# Patient Record
Sex: Female | Born: 1970 | Race: White | Hispanic: No | Marital: Single
Health system: Midwestern US, Community
[De-identification: ages and names within clinical notes are randomized; demographics above are authoritative.]

## PROBLEM LIST (undated history)

## (undated) DIAGNOSIS — N736 Female pelvic peritoneal adhesions (postinfective): Secondary | ICD-10-CM

## (undated) DIAGNOSIS — R1031 Right lower quadrant pain: Secondary | ICD-10-CM

## (undated) DIAGNOSIS — F339 Major depressive disorder, recurrent, unspecified: Secondary | ICD-10-CM

## (undated) DIAGNOSIS — M25511 Pain in right shoulder: Secondary | ICD-10-CM

## (undated) DIAGNOSIS — I10 Essential (primary) hypertension: Secondary | ICD-10-CM

## (undated) HISTORY — PX: ABDOMINAL HYSTERECTOMY: SHX81

## (undated) MED ORDER — CLINDAMYCIN 300 MG CAP
300 mg | ORAL_CAPSULE | Freq: Four times a day (QID) | ORAL | Status: AC
Start: ? — End: 2012-05-16

---

## 2001-07-09 NOTE — ED Provider Notes (Signed)
[facility]                      EMERGENCY DEPARTMENT TREATMENT REPORT   NAME:  [name], [name]   MR #:         BILLING #: [id number]          DOS: 07/09/2001   TIME: 6:30 P   49-21-15   cc:   Primary Physician:   CHIEF COMPLAINT:  Wisdom tooth pain.   HISTORY OF PRESENT ILLNESS:  [age]-year-old female with a complaint   of right lower wisdom tooth pain for quite sometime, worse of the past 2   months.  She is scheduled to see her dentist in [place] on Thursday to have   this removed.  She was given a prescription for Vicodin ES and Trimox   essentially when she saw the dentist.  She states she had been out of   Vicodin ES for a week.  She has about 3 pills of the Trimox left. No fever,   no chills, no other complaints.   REVIEW OF SYSTEMS:  See HPI.   PAST MEDICAL HISTORY:  None.   MEDICATIONS:  Trimox.   ALLERGIES:  None.   PHYSICAL EXAMINATION:   GENERAL:  Well-developed, well-nourished female, awake and alert.   VITAL SIGNS:  Blood pressure 129/88, pulse 92, respirations 20, temperature   98.6.   HEENT:  Examination of her mouth does reveal a slightly impacted right   lower wisdom tooth.  The gingiva is tender.  The tooth is tender. I cannot   appreciate any abscess.  The remainder of the mouth is unremarkable.  Ears:   Clear bilaterally.   NECK:  Supple, symmetrical.  No cervical lymphadenopathy.   IMPRESSION AND MANAGEMENT PLAN:  [age]-year-old female who presents   for evaluation of dental pain.  At this time we will refill her Trimox.   She will be given a prescription for Vicodin #16 tablets. She will follow   up with her dentist this week for further evaluation and pain management.   Certainly return in the interim for worsening or new concerns.   FINAL DIAGNOSIS:  Acute, recurrent dental pain, right lower wisdom tooth.   DISPOSITION:  The patient was discharged home in stable condition, to    follow up as above. The patient was evaluated by myself and Dr. Vinnie Langton,   who agrees with the above assessment and plan.   Electronically Signed By:   Wetzel Bjornstad Arvella Merles, M.D. 07/11/2001 00:56   ____________________________   Wetzel Bjornstad. Arvella Merles, M.D.   cd  D:  07/09/2001  T:  07/10/2001  9:54 P   [id number]   Fara Chute, PA

## 2002-04-15 NOTE — ED Provider Notes (Signed)
[facility]                      EMERGENCY DEPARTMENT TREATMENT REPORT   NAME:  BETSIE, PECKMAN                      PT. LOCATION:     ER   MR #:         BILLING #: [id number]          DOS: 04/15/2002   TIME:11:22 A   49-21-15   cc:   Primary Physician:   CHIEF COMPLAINT:   Wisdom tooth pain.   HISTORY OF PRESENT ILLNESS:  The patient is a 32 year old female who has   been experiencing a painful right lower wisdom tooth.  She has seen her   dentist, is on Motrin, Penicillin, and Tylenol #3.  She is scheduled for   extraction on Friday.  Because of increasing pain unrelieved by Tylenol #3,   she presents for evaluation.   REVIEW OF SYSTEMS:   CONSTITUTIONAL:  No fever, chills, weight loss.   ENT:  Was last here for wisdom tooth discomfort back in April of 2003.   PAST MEDICAL HISTORY:  Unremarkable.   ALLERGIES:  None.   MEDICATIONS:  Motrin, Penicillin, Tylenol #3.   PHYSICAL EXAMINATION:   VITAL SIGNS:   Blood pressure 110/67, pulse 86, respirations 20,   temperature 97.7.  She rates her pain 6/10.   GENERAL:  The patient is a very pleasant female.   HEENT:   Oropharynx pink, membranes moist.  Tender right lower wisdom   tooth.  No adjacent gingival erythema, swelling, or area of fluctuance.   NECK:  Supple, nontender, symmetrical, no masses or JVD, trachea midline,   thyroid not enlarged, nodular, or tender.   LYMPHATICS:   No cervical or submandibular lymphadenopathy palpated.   IMPRESSION/MANAGEMENT PLAN:  The patient is a 32 year old female with   dental pain due to impacted wisdom tooth.  She is scheduled for extraction   and on antibiotics and Motrin.  Will give a prescription for Vicodin, #12.   FINAL DIAGNOSIS:  Acute dental pain.   DISPOSITION:  The patient is discharged home in stable condition, with   instructions to follow up with their regular doctor.  They are advised to   return immediately for any worsening or symptoms of concern.  Continue with   medications.    Electronically Signed By:   Shanna Cisco, M.D. 04/19/2002 23:17   ____________________________   Shanna Cisco, M.D.   jj  D:  04/15/2002  T:  04/15/2002  7:04 P   [id number]   Blanchard Mane, PA

## 2003-06-02 NOTE — ED Provider Notes (Signed)
[facility]                      EMERGENCY DEPARTMENT TREATMENT REPORT   NAME:  CHARLII, YOST                      PT. LOCATION:     ER  QC08   MR #:         BILLING #: [id number]          DOS: 06/02/2003   TIME: 1:16 P   31-21-15   cc:   Primary Physician:   Time of evaluation:  1305.   CHIEF COMPLAINT:  "I need a refill of my Adderall."   HISTORY OF PRESENT ILLNESS:  This is a 33 year old female who is requesting   a refill of her Adderall.  She states she is in the process of moving.  She   thought she had a refill left and does not.  She states she believes she   can get an appointment with her doctor on Wednesday or Thursday and is   asking to get enough until that time.  Denies any other complaints.   REVIEW OF SYSTEMS:   Denies any complaints at this time, requesting   medication refill.   PAST MEDICAL HISTORY:  ADD.   MEDICATIONS:  None.   ALLERGIES:  None.   SOCIAL HISTORY:  Noncontributory.   PHYSICAL EXAMINATION:   GENERAL:  Well-developed female.   VITAL SIGNS:  Blood pressure 146/90, pulse 129, respirations 20,   temperature 99.   HEENT:  Eyes:  Conjunctivae clear, lids normal.  Pupils equal, symmetrical,   and normally reactive.   Mouth/Throat:  Surfaces of the pharynx, palate,   and tongue are pink, moist, and without lesions.   NECK:  Supple, nontender, symmetrical, no masses or JVD, trachea midline,   thyroid not enlarged, nodular, or tender.   RESPIRATORY:  Clear and equal breath sounds.  No respiratory distress,   tachypnea, or accessory muscle use.   CARDIOVASCULAR:  Heart regular, without murmurs, gallops, rubs, or thrills.   PMI not displaced. Repeat pulse was 92.   IMPRESSION AND MANAGEMENT PLAN:  33-year-old female who presents for   medication refill of Adderall.  Discussed at this time that we do not   refill Adderall.  She need to see her doctor for that.   FINAL DIAGNOSIS:  Medication refill.    DISPOSITION:  The patient was discharged home in stable condition, to   follow up as above. The patient was evaluated by myself and Dr. Katharine Look,   who agrees with the above assessment and plan.   Electronically Signed By:   Docia Furl, M.D. 06/10/2003 16:43   ____________________________   Docia Furl, M.D.   cd  D:  06/02/2003  T:  06/03/2003  6:36 P   [id number]   Fara Chute, PA

## 2003-10-27 NOTE — ED Provider Notes (Signed)
[facility]                      EMERGENCY DEPARTMENT TREATMENT REPORT   NAME:  [name], [name]                      PT. LOCATION:     ER  QC01   MR #:         BILLING #: [id number]          DOS: 10/27/2003   TIME:12:51 P   49-21-15   cc:   Primary Physician:   Time seen:  1251 hours.   CHIEF COMPLAINT:  Dental pain.   HISTORY OF PRESENT ILLNESS:  This is a 33 year old female who presents with   a tooth and states that she is supposed to be having her right lower wisdom   tooth pulled in 4 days.  She say the dentist, Dr. Waverly Ferrari, in [facility]   last week and is on penicillin and Vicodin but that is not helping with the   pain.  She also took some Motrin.   REVIEW OF SYSTEMS:   CONSTITUTIONAL:  No fever.   ENT:  Positive dental pain, positive right ear pain.   PAST MEDICAL HISTORY:  Negative for any chronic illnesses.  States she is   scheduled to have all 4 wisdom teeth pulled on August 11th.   MEDICATIONS:  Penicillin, Vicodin, Motrin.   ALLERGIES: None.   SOCIAL HISTORY: Noncontributory.   PHYSICAL EXAMINATION:   VITAL SIGNS:  Blood pressure 116/63, pulse 65, respirations 16, temperature   97.8.   HEENT:  No facial edema or erythema noted.  Some edema noted of the gingiva   around right lower molar.  No abscess seen.   NECK:  Supple, no masses palpable.   PROCEDURE NOTE:  Dental block performed with 2% Xylocaine, 0.5% bupivacaine   with improvement in pain.   DIAGNOSIS: Acute Odontalgia   DISPOSITION:  The patient was advised to continue current medications and   call her dentist tomorrow.  The patient was evaluated by myself and Dr.   Jerolyn Center agrees with the above assessment and plan.   Electronically Signed By:   Stephan Minister, M.D. 10/31/2003 17:22   ____________________________   Stephan Minister, M.D.   sp  D:  10/27/2003  T:  10/28/2003 12:40 P   [id number]   DIANA A HOULE, PA-C

## 2004-10-31 NOTE — ED Provider Notes (Signed)
[facility]                      EMERGENCY DEPARTMENT TREATMENT REPORT   NAME:  PROSPERITY, DARROUGH                      PT. LOCATION:     ER  [id number]   MR #:         BILLING #: [id number]          DOS: 10/31/2004   TIME:10:34 A   49-21-15   cc:   Primary Physician:   CHIEF COMPLAINT:  Vision and pain.   HISTORY OF PRESENT ILLNESS:  This is a 34 year old female who woke up with   her eyes feeling irritated. She does wear soft contact lenses which she   slept in last night.  She took her lenses out because they were irritating   her and the pain has become progressively worse in both eyes.  Initially it   was worse in the left eye, but now both eyes hurt.  She is very sensitive   to light.  Her fianc  states she has been rubbing her eyes.  She has not   been getting more than about 4 hours of sleep at night due to just a very   busy schedule.  She has had no upper respiratory infection symptoms.  She   has not noticed any rash, no fever.   REVIEW OF SYSTEMS   CONSTITUTIONAL:  No fever, chills, weight loss.   EYES:  See HPI.   ENT: No sore throat, runny nose or other URI symptoms.   INTEGUMENTARY:  No rashes.   NEUROLOGICAL:  She states her head hurts because of her eyes.   Denies any other complaints.   PAST MEDICAL HISTORY:  None.   MEDICATIONS:  None.   ALLERGIES:  No known drug allergies.   SOCIAL HISTORY: Noncontributory.    Last menstrual period was 10-18-04.   PHYSICAL EXAMINATION:   GENERAL:   This is a well developed female.   VITAL SIGNS:  Blood pressure 117/72, pulse 73, respirations 20, temperature   96.9.   HEENT:   Eyes: The right eye is mildly injected.  Left eye is injected.   Pupils equal, round, reactive to light and accommodation.  Fluorescein   staining and Ophthaine were instilled.  Slit lamp examination with the   right eye reveals some stippled uptake along the edges of the cornea, some   faint more confluent uptake probably about 4 to 7 o'clock, no dendrites    seen.  The anterior chamber is clear.  No foreign body is seen.   Examination of the left eye also reveals some stippled uptake along the   edges of the cornea more pronounced in the lower portion with a superficial   abrasion seen.  No dendritic lesions seen.  The anterior chamber is clear.   There is no sign of alteration in either cornea, no foreign body.   Visual   acuity was attempted; however, the patient could not see due to the fact   that she did not have her contact lenses.  Ears:  Tympanic membranes are   clear.   INTEGUMENTARY:  There is no facial rash.   INITIAL ASSESSMENT AND MANAGEMENT PLAN:  This is a 34 year old female who   presents for evaluation of bilateral eye pain.  She has been wearing her   contact lenses, has slept  in them.  She does appear to have some keratitis   with some abrasion from her lenses.  At this time we will start her on some   Polytrim ophthalmic drops as well as prescription for Tylox #20.  We will   dilate her eyes with homatropine and she will return.  Dr. Cipriano Mile was in   to see and examine the patient as well.  Findings were reviewed.  She will   return tomorrow for recheck or sooner for worsening symptoms or concern.   She will also be given the name of Dr. Para March, on call for   ophthalmology, to follow up with on Monday.  We will recheck her tomorrow.   She was advised that she is not to wear her contact lenses until she is   cleared by the ophthalmologist.   FINAL DIAGNOSES:      1. Evaluation bilateral eye pain - keratitis and corneal abrasion.      2. Contact lenses.   DISPOSITION:  The patient was discharged to home in stable condition with   her family to follow up as above.  The patient was examined by myself and   Dr. Damien Fusi who agrees with the assessment and plan.   Electronically Signed By:   Damien Fusi, M.D. 11/01/2004 06:41   ____________________________   Damien Fusi, M.D.   rew  D:  10/31/2004  T:  10/31/2004  3:31 P   [id number]    Fara Chute, PA

## 2005-12-19 NOTE — ED Provider Notes (Signed)
[facility]                      EMERGENCY DEPARTMENT TREATMENT REPORT   NAME:  [name], [name]                      PT. LOCATION:     ER  [phone]   MR #:         BILLING #: [id number]          DOS: 12/19/2005   TIME:   49-21-15   cc:   Primary Physician:   TIME OF EVALUATION:  2239   CHIEF COMPLAINT:  Needs medication.   HISTORY OF PRESENT ILLNESS:  This is a 35 year old female who is stating   that her husband was suddenly called out of town and accidentally grabbed   her medications along with his when he left.  He is in [place] and has been   there for 3 days.  She states she has been fine over the weekend.  The   medication she is requesting is Adderall.  She states she cannot afford to   go to her primary care because it is an $85 visit just to get the   medication refilled.  She is denying any symptoms or any other complaints.   REVIEW OF SYSTEMS:  Essentially negative, and the patient is denying any   complaints.   PAST MEDICAL HISTORY:  Unremarkable.   SURGICAL HISTORY:  Tubal ligation.   PSYCHIATRIC HISTORY:  ADHD.   FAMILY HISTORY:  Noncontributory.   ALLERGIES:  The patient has no known medical allergies.   CURRENT MEDICATIONS:  Include Adderall 20 mg 2 times a day.   PHYSICAL EXAMINATION:   VITAL SIGNS:  Blood pressure is 113/71, pulse 105, respirations 20,   temperature 98, and O2 saturation 100% on room air.   GENERAL APPEARANCE:  Patient appears well developed and well nourished.   Appearance and behavior are age and situation appropriate.   RESPIRATORY:  Clear and equal breath sounds.  No respiratory distress,   tachypnea, or accessory muscle use.   CARDIOVASCULAR:  Heart regular, without murmurs, gallops, rubs, or thrills.   PMI not displaced.   MS:  Stance and gait appear normal.   SKIN:  Warm and dry without rashes.   NEUROLOGIC:  Cranial nerves, deep tendon reflexes, strength, and light   touch sensation are unremarkable.    PSYCHIATRIC:  Judgment appears appropriate.  Recent and remote memory   appears to be intact.  Oriented to time, place and person.  Mood and affect   appropriate.   INITIAL ASSESSMENT/MANAGEMENT PLAN:  I discussed this case with Dr. Luiz Iron who accessed her records on [facility] and also on [facility].  The   patient has multiple visits for complaints of pain and prescriptions for   Vicodin, however, does not have very many previous visits for prescriptions   for Adderall.  The plan is to write her a prescription for three 20-mg   tablets of Adderall and for her to follow up with her primary care   physician if she wishes to have any further prescriptions for Adderall.   There were no diagnostic studies performed.   DIAGNOSIS:  Medication refill.   DISPOSITION:  The patient is being discharged to home with a prescription   for 20-mg Adderall #3 tablets.  The patient is discharged with verbal and   written instructions and a referral  for ongoing care.  The patient is aware   that they may return at any time for new or worsening symptoms.   Electronically Signed By:   Luiz Iron, M.D. 12/26/2005 21:23   ____________________________   Luiz Iron, M.D.   My signature above authenticates this document and my orders, the final   diagnosis(es), discharge prescription(s) and instructions in the [facility] record.   st  D:  12/19/2005  T:  12/20/2005  1:15 P   [id number]   KARI TOWNS, PA-C

## 2007-06-20 NOTE — ED Provider Notes (Signed)
[facility]                      EMERGENCY DEPARTMENT TREATMENT REPORT   NAME:  JOVANKA, WESTGATE         PT. LOCATION:      [id number]          DOB:                                                                         AGE:   MR #:      BILLING #:           DOA:  06/20/2007   DOD:              SEX:  F   49-21-15   [id number]   cc:   Primary Physician:  Unknown   TIME SEEN: 1350.   CHIEF COMPLAINT: Sore throat.   HISTORY OF PRESENT ILLNESS: This is  a 37 year old female coming in with   above complaint.  The patient states this started yesterday as a little bit   of cough.  She said however, it started then to hit her throat and that is   now what is bothering her the most.  She says it is worse with swallowing.   She also is having pain in her right ear.  Has had some fevers at home   around 101.  Had taken some TheraFlu as needed and some over-the-counter   restricted cough medicine which apparently contains codeine.  Cough has had   a little bit of yellow/green phlegm.   REVIEW OF SYSTEMS:   CONSTITUTIONAL: Positive fever.   EYES: No visual symptoms.   ENT:  As described in HPI.   RESPIRATORY: Positive cough.  No wheezing.   CARDIOVASCULAR:  No chest pain, chest pressure, or palpitations.   INTEGUMENTARY:  No rashes.   PAST MEDICAL HISTORY: Anxiety.   SOCIAL HISTORY: Negative for tobacco.   CURRENT MEDICATIONS: Took an Ambien to try to sleep.   ALLERGIES:  No known drug allergies.   PHYSICAL EXAMINATION:   VITAL SIGNS:  Temperature 99.6, pulse 108, blood pressure 151/100,   respirations 20, oxygen 100% on room air.   GENERAL: Well-developed female.   HEENT: Head normocephalic.  Eyes nonicteric.  Conjunctivae clear. Ears:   She has notable fluid bilaterally.  No significant erythema or bulging of   the TMs.  Mouth/Throat:  Surfaces of the pharynx tonsils are swollen,   slightly red, no exudate.   NECK:  Supple. She does have some cervical lymphadenopathy noted.    CARDIOVASCULAR: Heart is borderline tachycardic. No murmur noted.   LUNGS: Pretty clear and equal.  Diminished expiratory sounds.   SKIN:  Warm and dry without rashes.   IMPRESSION/MANAGEMENT PLAN: This is a 37 year old female who is coming in   with fever and mostly sore throat.  I have gone ahead and swabbed her for   strep.   DIAGNOSTIC TESTING: Rapid strep test negative.   FINAL DIAGNOSES:   1. Viral pharyngitis.   2. Upper respiratory infection.   DISPOSITION: The patient discharged with a prescription for Tussionex.   Instructed to drink plenty of fluids, Motrin every 6 hours.  Return if   worsening, otherwise follow up with family doctor.   Electronically Signed By:   Haze Justin, M.D. 06/21/2007 15:18   ____________________________   Haze Justin, M.D.   My signature above authenticates this document and my orders, the final   diagnosis(es), discharge prescription(s) and instructions in the [facility] record.   DH  D:  06/20/2007  T:  06/20/2007  5:44 P   [id number]   BARBARA A GRUENHAGEN, PA-C

## 2007-12-16 NOTE — ED Provider Notes (Signed)
[facility]                      EMERGENCY DEPARTMENT TREATMENT REPORT   NAME:  [name], [name]          PT. LOCATION:    ER  ER41       DOB:  [birth year]                                                                     AGE:  37   MR #:       BILLING #:           DOS: 12/16/2007  TIME: 9:30 P   SEX:  F   49-21-15    [id number]   cc:   Primary Physician:   Primary Physician:   CHIEF COMPLAINT   Some tooth pain.   HISTORY OF PRESENT ILLNESS   This is a 37 year old female who presents to the emergency room with   complaint of dental pain.  The patient states that it has been bothering   her for the past year, and over the past couple months was just increasing.   She decided to get the tooth pulled, and she is being treated with   penicillin and Vicodin by her oral surgeon.  Unfortunately it ran out, and   a couple days later it started increasing in intensity.  She is scheduled   for a procedure next week.  The patient is coming in for concern in regards   to pain and possible infection for further evaluation and treatment.   REVIEW OF SYSTEMS   CONSTITUTIONAL:  No fever, chills, weight loss.   ENT:  Right lower jaw pain.   PAST MEDICAL HISTORY   Noncontributory.   SOCIAL HISTORY   Denies alcohol or tobacco use.   ALLERGIES   None.   PHYSICAL EXAMINATION   VITAL SIGNS:  Blood pressure 147/102, pulse 109, respirations 18,   temperature 98.2, oxygen saturations 100% on room air, pain 6/10.   GENERAL APPEARANCE:  The patient appears well developed and well nourished.   Appearance and behavior are age and situation appropriate.   HEENT:  ENT:  Right lower jaw, the patient's wisdom tooth on the right   lower aspect of the jaw is coming in angularly and pushing in to the   premolars.  Does show some inflammation.  Very tender to palpation.  Uvula   is central.  Tonsils are not enlarged.   INITIAL ASSESSMENT AND MANAGEMENT PLAN    This is a 37 year old female who is coming in with a wisdom tooth coming in   that is possibly impacted.  Will go ahead and continue to treat   symptomatically and have followup with her oral surgeon.   CLINICAL IMPRESSION/DIAGNOSIS   Jaw pain secondary to dental pain.   DISPOSITION/PLAN   The patient is discharged home in stable condition, with instructions to   follow up with their regular doctor.  They are advised to return   immediately for any worsening or symptoms of concern.  Discharged with a   prescription for penicillin and Vicodin.  The patient seen by Dr. Erlinda Hong.  Assessment and plan is agreed  upon.   Electronically Signed By:   Erlinda Hong, M.D. 01/06/2008 12:11   ____________________________   TODD Wilfrid Lund, M.D.   Dictated by:  Dorian Heckle, PA   My signature above authenticates this document and my orders, the final   diagnosis(es), discharge prescription(s) and instructions in the [facility] record.   SP1  D:  12/16/2007  T:  12/17/2007  9:08 A   [id number]/

## 2008-01-05 NOTE — ED Provider Notes (Signed)
[facility]                      EMERGENCY DEPARTMENT TREATMENT REPORT   NAME:  [name], [name]            PT. LOCATION:    ER  [phone]       DOB:  [birth year]                                                                     AGE:  37   MR #:       BILLING #:           DOS: 01/05/2008  TIME:11:48 A   SEX:  F   49-21-15    [id number]   cc:   Primary Physician:   CHIEF COMPLAINT:  Spider bite, right leg.   HPI:  This is a 37 year old female, with no history of abscesses or spider   bites, who comes in with a 5-day history of an abscess to the right   posterior calf.  She notes that it has continued to increase in size and   redness.  No fever or discharge from the area and has not manipulated it.   REVIEW OF SYSTEMS:   CONSTITUTIONAL:  Negative fever and chills.   SKIN:  As above.   PAST MEDICAL HISTORY:  A history of ADHD, anxiety.   MEDICATIONS:  None.   ALLERGIES:  None.   SOCIAL HISTORY:  Denies tobacco usage.  Here alone.   PHYSICAL EXAM:   VITAL SIGNS:  Blood pressure is 149/106, pulse 133, respirations 15,   temperature 99.4, and pain 8/10.   GENERAL APPEARANCE:  The patient appears well developed and well nourished.   Appearance and behavior are age and situation appropriate.   SKIN:  Tenderness noted to the right posterior calf with a central abscess   with a necrotic core with significant surrounding induration and erythema   which was warm to palpation and feverish to touch.  Does have some   secondary swelling to the calf.  Tender upon palpation.   ED PROCEDURE NOTE:  The area was cleansed with alcohol, anesthetized with   2% lidocaine without epinephrine, sterilely draped, and cleansed with   Betadine.  An incision was made with an #11 blade scalpel.  Large amounts   of copious discharge extracted, packed with 1-1/4-inch Iodoform packing.   Area cleansed, dressing applied.  The patient tolerated the procedure well.    CLINICAL IMPRESSION/DIAGNOSIS:  Right posterior calf abscess with incision   and drainage with packing insertion.   DISPOSITION/PLAN:  Discharged home on Bactrim and Vicodin.  Apply warm   moist compresses, increase fluids and rest.  Return in 48 hours.  Complete   antibiotic.  Vicodin may make drowsy.  Wash and clean the area with soap   and water.  Keep packing in.  Return for any concerns.  Take Motrin also   for discomfort.   Dr. Carmela Hurt saw this patient and agrees with the above.  We are   currently awaiting recheck of vital signs.   Electronically Signed By:   Dixie Dials, M.D. 01/15/2008 20:59   ____________________________   Dixie Dials, M.D.   Dictated by:  Edwinna Areola, PA-C   My signature above authenticates this document and my orders, the final   diagnosis(es), discharge prescription(s) and instructions in the [facility]   [facility] record.   ST  D:  01/05/2008  T:  01/08/2008 10:47 A   [id number]

## 2008-01-09 NOTE — ED Provider Notes (Signed)
[facility]                      EMERGENCY DEPARTMENT TREATMENT REPORT   NAME:  [name], [name]            PT. LOCATION:    ER  ER03       DOB:  [birth year]                                                                     AGE:  37   MR #:       BILLING #:           DOS: 01/09/2008  TIME: 5:12 P   SEX:  F   49-21-15    [id number]   cc:   Primary Physician:   Primary Physician:   CHIEF COMPLAINT   Fever, right leg abscess.   HISTORY OF PRESENT ILLNESS   This is a 37 year old female who represents for packing removal and recheck   of an abscess that was I&amp;D in this emergency department 4 days ago.  The   patient states she did not come back for a 2-day recheck as directed.  She   feels as if she has felt worse despite taking the Bactrim and pain   medication.  She has had 3  episodes of vomiting after taking the Bactrim   but states that she has been able to, with most doses, keep them down.  She   states that she now has a right lower leg swelling especially about the   foot and pain that is 10/10 constant pain.  She has had low grade   temperatures of 100 degrees.  States she is taking Advil prior to coming   here and this could mask her fever.  Denies any history of trauma to the   right lower extremity.   REVIEW OF SYSTEMS   CONSTITUTIONAL:  Fever.   ENT: No sore throat, runny nose or other URI symptoms.   RESPIRATORY:  No cough, shortness of breath, or wheezing.   CARDIOVASCULAR:  No chest pain, chest pressure, or palpitations.   GASTROINTESTINAL:  Vomiting.   INTEGUMENTARY:  As above.   MUSCULOSKELETAL:  As above.   PAST MEDICAL HISTORY   Unremarkable.   SURGICAL HISTORY   Tubal ligation.   SOCIAL HISTORY   Denies tobacco, alcohol, recreation drug use.   CURRENT MEDICATIONS   1. Vicodin.   2. Bactrim DS.   ALLERGIES   NKDA.   PHYSICAL EXAMINATION   VITAL SIGNS:  Blood pressure 116/67, pulses 111, respirations 20,   temperature 97.5, pain 10, O2 sat 98% on room air.    GENERAL APPEARANCE:  This is a well-developed, well-nourished female who   appears to be in pain distress.  Alert and oriented x3.  Appearance and   behavior are age and situation appropriate.   HEENT:  Eyes:  Conjunctivae clear, lids normal.  Pupils equal, symmetrical,   and normally reactive.  Ears/Nose:  Hearing is grossly intact to voice.   Internal and external examinations of the ears and nose are unremarkable.   Mouth/Throat:  Surfaces of the pharynx, palate, and tongue are pink, moist,   and without lesions.  RESPIRATORY:  Clear and equal breath sounds.  No respiratory distress,   tachypnea, or accessory muscle use.   CARDIOVASCULAR:  Heart regular, without murmurs, gallops, rubs, or thrills.   PMI not displaced.   MUSCULOSKELETAL:  The patient does have a very demarcated line of edema   that extends just above the ankle in circular fashion from the right foot.   It is a little erythematous, a little warm when compared with the left.  I   have peeled the sock off that came to this level of demarcation of edema.   She is tender to palpation over this right foot but has normal 2+ pulse   and she is neurovascularly intact.   INTEGUMENTARY:  Packing was removed from the incision that is the middle   aspect of the right calf.  No purulent discharge could be expressed.  No   surrounding areas of erythema, hyperthermia.  She has cellulitis was   noted.   INITIAL ASSESSMENT   A 37-year female who presents febrile complaining of continued fever, pain   and swelling at the I&amp;D.  We will order PVL rule out DVT.  Also start IV   antibiotics adding additional antibiotics and treat for pain.   RESULTS OF DIAGNOSTIC STUDIES   Normal right foot radiograph as read by Dr. Jolayne Panther.  Negative PVL was   given by tech.   COURSE IN THE EMERGENCY DEPARTMENT   The patient received Dilaudid 1 mg IV as well as Zofran 4 mg IV for pain   control; this did help.  She had a headache development several hours later    during treatment, was given Tylenol and re-dosed with Dilaudid as leg pain   returned.   FINAL DIAGNOSIS   Right foot cellulitis with edema.   DISPOSITION   The patient discharged stable to home.  She is given Keflex in addition.   She is to follow up with primary care physician.  Return to the ED if her   symptoms persist or worsen.  She will also take Vicodin.  The patient was   evaluated by myself and Dr. Luiz Iron who agrees.   Electronically Signed By:   Carlos American, M.D. 01/23/2008   20:05   ____________________________   Carlos American, M.D.   Dictated by:  Nelda Bucks, PA-C   My signature above authenticates this document and my orders, the final   diagnosis(es), discharge prescription(s) and instructions in the [facility] record.   SB1  D:  01/09/2008  T:  01/10/2008  5:27 P   [id number]/

## 2008-04-12 NOTE — ED Provider Notes (Signed)
[facility]                      EMERGENCY DEPARTMENT TREATMENT REPORT   NAME:  [name], [name]                    PT. LOCATION:     ER  ER23   MR #:         BILLING #: [id number]          DOS: 04/12/2008   TIME:12:44 P   49-21-15   cc:   CHIEF COMPLAINT   Lower jaw pain.   HISTORY OF PRESENT ILLNESS   This is a 38 year old female who presents with pain right lower jaw.   States that her wisdom tooth is hurting her.  She has had this problem   before.  Trying to get into a dentist, but waiting for approval from   [facility].   REVIEW OF SYSTEMS   CONSTITUTIONAL:  No fever.   ENT:  Positive jaw pain.   PAST MEDICAL HISTORY   Otherwise negative.   SOCIAL HISTORY   Noncontributory.   PHYSICAL EXAMINATION   VITAL SIGNS:  BP 128/87, pulse 98, respirations 16, and temperature 98.2.   GENERAL APPEARANCE:  The patient appears well developed and well nourished.   Appearance and behavior are age and situation appropriate.   HEENT:  Eyes:  Conjunctivae clear, lids normal.  Pupils equal, symmetrical,   and normally reactive.  No facial edema or erythema noted.  Some tenderness   noted right lower wisdom tooth.  No abscess noted.   NECK:  Supple.  No mass palpable.   FINAL DIAGNOSIS   Acute dental pain.   DISPOSITION/PLAN   The patient discharged on Lodine and Pen VK.  Follow up with her dentist.   Patient evaluated by myself and Dr. Luiz Iron who agrees with the   above assessment and plan.   Electronically Signed By:   Carlos American, M.D. 04/21/2008   04:54   ____________________________   Carlos American, M.D.   Dictated by Dorise Hiss   My signature above authenticates this document and my orders, the final   diagnosis(es), discharge prescription(s) and instructions in the [facility] record.   TW  D:  04/12/2008  T:  04/12/2008  2:51 P   [id number]

## 2008-12-01 NOTE — ED Provider Notes (Signed)
[name]                      EMERGENCY DEPARTMENT TREATMENT REPORT   PRELIMINARY (DRAFT) -- FINAL REPORT  in [facility]   NAME:  [name]              SEX:            F   DATE:  12/01/2008                     DOB:            09-21-1970   MR#    [id number]                       TIME SEEN       11:59 A   ACCT#  [id number]                      ROOM:           ER  ER50   cc:    Laurian Brim, M.D.   TIME SEEN   [phone]   CHIEF COMPLAINT   Tooth pain.   HISTORY OF THE PRESENT ILLNESS   This is a 38 year old female with right lower wisdom tooth pain for 3 days.   She is not able to see her dentist till tomorrow but in the meantime her   primary care prescribed her Percocet and amoxycillin for pain and   infection.  She says the Percocet has not been alleviating her pain and   requests something different till her appointment with dentist tomorrow.   REVIEW OF SYSTEMS   CONSTITUTIONAL:  No fever or chills.   RESPIRATORY:  No cough, shortness of breath, or wheezing.   GASTROINTESTINAL:  No vomiting, diarrhea, or abdominal pain.   PAST MEDICAL HISTORY   Noncontributory.   SOCIAL HISTORY   Here with family.   FAMILY HISTORY   Noncontributory.   ALLERGIES   Multiple and reviewed in [facility].   MEDICATIONS   Multiple and reviewed in [facility].   PHYSICAL EXAMINATION   VITAL SIGNS:  Blood pressure 121/68, pulse 91, respirations 16, temperature   97.4, oxygen 98% on room air.   HEENT:  Ears/Nose:  Hearing is grossly intact to voice.  Internal and   external examinations of the ears and nose are unremarkable.  Mouth; right   lower wisdom tooth broken, infected and tender.   LYMPHATICS:  No cervical or submandibular lymphadenopathy palpated.   RESPIRATORY:  Clear bilaterally.   INITIAL ASSESSMENT AND MANAGEMENT PLAN   This is a 38 year old female with 3 days of tooth pain, treat pain till   dentist visit tomorrow.   COURSE   The patient was given IM Toradol and p.o. Vicodin.   FINAL DIAGNOSIS    Toothache.   The patient was discharged in stable condition with prescriptions for   Ultram and Vicodin, instructed to keep appointment with dentist tomorrow.   Return to the emergency room with any new or worsening symptoms.  The   patient seen with Dr. Damien Fusi.   ____________________________   Damien Fusi, M.D.   Dictated By:  Lester Kinsman, PA-C   My signature above authenticates this document and my orders, the final   diagnosis(es), discharge prescription([name]) and instructions in the [facility]   [facility] record.   jh  D:  12/01/2008  T:  12/03/2008  2:49  P   [id number]

## 2009-02-17 NOTE — ED Provider Notes (Signed)
[facility]                      EMERGENCY DEPARTMENT TREATMENT REPORT           PRELIMINARY (DRAFT) -- FINAL REPORT  in [facility]   NAME:  DISTASO, Jina S              SEX:            F   DATE:  02/17/2009                     DOB:            05-12-1970   MR#    [id number]                       TIME SEEN        1:13 P   ACCT#  [id number]                      ROOM:           ER  [id number]       cc:       CHIEF COMPLAINT:  Nausea, vomiting, stomach cramps, headache.       HISTORY OF PRESENT ILLNESS:  This is a 38 year old female with a history of   migraines with a typical migraine beginning yesterday, February 16, 2009.   The migraine is usually located in the right side of her head and is sharp   and constant.  Imitrex and Inderal were taken without relief.  She has had   to go to ERs in the past for IV medications.  She has had nausea and has   vomited numerous times.  She is photophobic and phonophobic.  She is also   complaining of abdominal cramps from menses.  She has taken Motrin for the   pain.       REVIEW OF SYSTEMS:   CONSTITUTIONAL:  No fever or chills.  Has photophobia.   RESPIRATORY:  No cough, shortness of breath, or wheezing.   GASTROINTESTINAL:  As above.   GENITOURINARY:  No dysuria, frequency, or urgency.   NEUROLOGIC:  Migraine headache.       PAST MEDICAL HISTORY:  Migraines.       SOCIAL HISTORY:  Here alone.       FAMILY HISTORY:  Noncontributory.       ALLERGIES:  Cipro.       MEDICATIONS:  Multiple and reviewed in [facility].       PHYSICAL EXAMINATION:   VITAL SIGNS:  Blood pressure 145/96, pulse 91, respirations 20, temperature   98.   GENERAL:  Pleasant female.  General appearance, the patient ill appearing.       HEENT:  Eyes, conjunctivae clear.  Lids normal, PERRLA, photophobic.   Ears/Nose:  Hearing is grossly intact to voice.  Internal and external   examinations of the ears and nose are unremarkable.  Mouth/Throat:    Surfaces of the pharynx, palate, and tongue are pink, moist, and without   lesions.   NECK:  Supple.  No cervical or submandibular lymphadenopathy palpated.   RESPIRATORY:  Clear and equal breath sounds.  No respiratory distress,   tachypnea, or accessory muscle use.   HEART:  Regular rate and rhythm.  No murmurs, rubs, or gallops.   GI:  Abdomen soft and nondistended.  Mildly tender in bilateral lower  quadrants.   NEUROLOGIC:  Reflexes and strength intact.       CONTINUATION BY JENNY ALINDOGAN, PA-C       INITIAL ASSESSMENT AND MANAGEMENT PLAN This is a 38 year old female with a   typical migraine, nausea, vomiting and abdominal cramps due to menses.   Will check a urine and treat symptoms.       CLINICAL COURSEThe patient remained stable throughout ED stay. She received   IV fluids, IV Benadryl and IV Compazine. She was then reassessed, and she   said pain had not improved. She was then given  IV Toradol and IV Reglan   and after reassessment, her pain had only improved minimally. She was then   given ___ mg IV Dilaudid. Then, on final reassessment, she said her   symptoms had improved significantly.  She was ready to be discharged home   with some prescriptions for headache and nausea.       DIAGNOSTIC STUDIES Urine pregnancy was negative.  Urine had a large amount   of blood, consistent with her menstrual cycle.       The patient was discharged in stable condition with prescriptions for   Fioricet and Phenergan.  The patient is discharged home in stable   condition, with instructions to follow up with their regular doctor.  They   are advised to return immediately for any worsening or symptoms of concern.   The patient was personally evaluated by myself and Dr. Lucita Ferrara who   agrees with the above assessment and plan.                                   ____________________________   Lucita Ferrara, M.D.       Dictated By:  Lester Kinsman        My signature above authenticates this document and my orders, the final   diagnosis(es), discharge prescription(s) and instructions in the [facility]   [facility] record.       st  D:  02/17/2009  T:  02/18/2009  6:35 A   [id number]   pb  D:  02/17/2009  T:  02/17/2009 10:47 P   [id number]

## 2012-02-16 LAB — URINALYSIS W/ RFLX MICROSCOPIC
Bilirubin: NEGATIVE
Blood: NEGATIVE
Glucose: NEGATIVE MG/DL
Ketone: NEGATIVE MG/DL
Nitrites: POSITIVE — AB
Protein: NEGATIVE MG/DL
Specific gravity: 1.01 (ref 1.003–1.030)
Urobilinogen: 0.2 EU/DL (ref 0.2–1.0)
pH (UA): 6 (ref 5.0–8.0)

## 2012-02-16 LAB — URINE MICROSCOPIC ONLY
RBC: 0 /HPF (ref 0–5)
WBC: 4 /HPF (ref 0–4)

## 2012-02-16 LAB — HCG URINE, QL: HCG urine, QL: NEGATIVE

## 2012-02-16 NOTE — ED Provider Notes (Signed)
I was personally available for consultation in the emergency department. I have reviewed the chart prior to the patient's discharge and agree with the documentation recorded by the MLP, including the assessment, treatment plan, and disposition.

## 2012-02-16 NOTE — ED Notes (Signed)
Patient armband removed and shredded  I have reviewed discharge instructions with the patient.  The patient verbalized understanding.

## 2012-02-16 NOTE — ED Notes (Signed)
Urinary pain/ odor  Out of pain meds for chromic back pain

## 2012-02-16 NOTE — ED Provider Notes (Addendum)
HPI Comments: 41yo female with history of chronic back pain presents to ER complaining of similar lower back pain for last several days.  She is out of her Percocet and presents to ER requesting refill.  States she has strong urine odor and its cloudy and questions whether or not she has UTI.  Denies any fever/chills, N/V, dysuria.  Mild tingling in left thigh which is normal for her.  Denies any weakness or numbness.  No bowel or bladder incontinence.  Also denies abdominal pain.   Patient states her regular physician, Dr. Dione Housekeeper, treats her back pain.  Denies any referral to back specialist and states this is what she wants but with Medicaid can't see specialist without referral.  Gets Percocet prescriptions #60 at a time.  Patient admits to seeing a Dr. Huel Cote from [facility] remotely in the past and having an epidural injection which provided her relief from pain for about 8 weeks.     Patient is a 42 y.o. female presenting with back pain, medication refill, and urinary tract infection.   Back Pain   Pertinent negatives include no chest pain, no fever, no numbness, no abdominal pain, no dysuria, no pelvic pain and no weakness.   Medication Refill  Pertinent negatives include no chest pain, no abdominal pain and no shortness of breath.   Bladder Infection   Associated symptoms include back pain. Pertinent negatives include no chills, no nausea, no vomiting, no urgency, no flank pain and no abdominal pain.        Past Medical History   Diagnosis Date   ??? Adult ADHD    ??? Hypertension    ??? Anxiety    ??? Musculoskeletal disorder         Past Surgical History   Procedure Laterality Date   ??? Hx orthopaedic           History reviewed. No pertinent family history.     History     Social History   ??? Marital Status: SINGLE     Spouse Name: N/A     Number of Children: N/A   ??? Years of Education: N/A     Occupational History   ??? Not on file.     Social History Main Topics   ??? Smoking status: Not on file   ??? Smokeless tobacco: Not  on file   ??? Alcohol Use: Not on file   ??? Drug Use: Not on file   ??? Sexually Active: Not on file     Other Topics Concern   ??? Not on file     Social History Narrative   ??? No narrative on file                  ALLERGIES: Codeine and Erythromycin      Review of Systems   Constitutional: Negative for fever, chills, activity change, appetite change and fatigue.   HENT: Negative.    Respiratory: Negative.  Negative for shortness of breath.    Cardiovascular: Negative for chest pain.   Gastrointestinal: Negative for nausea, vomiting and abdominal pain.   Genitourinary: Negative for dysuria, urgency, flank pain, difficulty urinating and pelvic pain.   Musculoskeletal: Positive for back pain. Negative for gait problem.   Neurological: Negative for weakness and numbness.   All other systems reviewed and are negative.        Filed Vitals:    02/16/12 1714   BP: 134/89   Pulse: 82   Temp: 97.1 ??F (36.2 ??C)  Resp: 16   Height: 5\' 6"  (1.676 m)   Weight: 89.359 kg (197 lb)   SpO2: 100%            Physical Exam   Nursing note and vitals reviewed.  Constitutional: She is oriented to person, place, and time. She appears well-developed and well-nourished. No distress.   Mildly overweight.    Neck: Normal range of motion. Neck supple.   Cardiovascular: Normal rate, regular rhythm and normal heart sounds.    Pulmonary/Chest: Effort normal and breath sounds normal.   Abdominal: Soft. Bowel sounds are normal. She exhibits no distension. There is no tenderness. There is no guarding.   Musculoskeletal: Normal range of motion.   Diffuse muscular and vertebral TTP of lumbar spine.  Mild discomfort with rotation, flexion, and extension.   No overlying skin redness   Neurological: She is alert and oriented to person, place, and time.   Sensation and strength of lower extremities intact and equal bilaterally.   2+ patellar tendon reflexes bilaterally.    Skin: She is not diaphoretic.   Warm and clammy   Psychiatric: She has a normal mood and  affect.        MDM     Differential Diagnosis; Clinical Impression; Plan:     41yo female with chronic lower back pain here at [facility] for exacerbation of discomfort requesting refill of her pain medication because regular physician is out of town and after calling office was instructed to come to ER. Patient needs a spine specialist referral and possible even pain management clinic referral.  Will Rx for Naprosyn, Robaxin, and Percocet #12.  Patient states she has appt for 02/24/12.   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  Its good possibility patient has opiate addiction    Patient also does in fact have UTI which may be making her back pain feel worse.   UCx pending.  Rx for Macrobid      Procedures

## 2012-03-15 NOTE — ED Notes (Signed)
"  I have a really bad infected ingrown toenail and I just can't stand on it anymore. I also broke a tooth last night and now the root is exposed and it's killing me."

## 2012-03-15 NOTE — ED Notes (Signed)
I have reviewed discharge instructions with the patient.  The patient verbalized understanding.

## 2012-03-15 NOTE — ED Provider Notes (Signed)
HPI Comments: [name] is a 41 y.o. Female with history of HTN presented to the ED with C/O ingrown toenail.  Pt reports having a painful ingrown nail in her right great toe.  No other symptoms or complaints were presented at this time.       Patient is a 41 y.o. female presenting with nail problem and dental injury. The history is provided by the patient.   Ingrown Toenail    Dental Injury            Past Medical History   Diagnosis Date   ??? Adult ADHD    ??? Hypertension    ??? Anxiety    ??? Musculoskeletal disorder         Past Surgical History   Procedure Laterality Date   ??? Hx orthopaedic           History reviewed. No pertinent family history.     History     Social History   ??? Marital Status: SINGLE     Spouse Name: N/A     Number of Children: N/A   ??? Years of Education: N/A     Occupational History   ??? Not on file.     Social History Main Topics   ??? Smoking status: Not on file   ??? Smokeless tobacco: Not on file   ??? Alcohol Use: Not on file   ??? Drug Use: Not on file   ??? Sexually Active: Not on file     Other Topics Concern   ??? Not on file     Social History Narrative   ??? No narrative on file                  ALLERGIES: Codeine and Erythromycin      Review of Systems   Constitutional: Negative.    HENT: Negative.    Eyes: Negative.    Respiratory: Negative.    Cardiovascular: Negative.    Gastrointestinal: Negative.    Endocrine: Negative.    Genitourinary: Negative.    Musculoskeletal: Negative.    Skin:        See HPI.    Allergic/Immunologic: Negative.    Neurological: Negative.    Hematological: Negative.    Psychiatric/Behavioral: Negative.    All other systems reviewed and are negative.        Filed Vitals:    03/15/12 1217   BP: 145/102   Pulse: 114   Temp: 97.3 ??F (36.3 ??C)   Resp: 16   Height: 5\' 6"  (1.676 m)   Weight: 90.719 kg (200 lb)   SpO2: 100%            Physical Exam   Constitutional: She is oriented to person, place, and time. She appears well-developed and well-nourished.   HENT:   Head:  Normocephalic.   Pain to tooth with erotion # 16     Eyes: Pupils are equal, round, and reactive to light.   Neck: Normal range of motion.   Cardiovascular: Normal rate.    Pulmonary/Chest: Effort normal.   Musculoskeletal:   Ingrown toe nail right great toe   Neurological: She is alert and oriented to person, place, and time.   Skin: Skin is warm and dry.        MDM     Differential Diagnosis; Clinical Impression; Plan:     Ingrown nail noted to right great toe anesthetized with 6 cc lidocaine and medial third of the nail was cut  down to the nail bed matrix and removed.  Refer to podiatry and dental clinic      Procedures    Scribe Attestation Statement:   Susanne Borders 03/15/2012 12:44 PM scribing for and in the presence of Dr.Terrian Ridlon Lorie Apley, MD     Susanne Borders, Scribe      Provider Attestation:   I personally performed the services described in the documentation, reviewed the documentation, as recorded by the scribe in my presence, and it accurately and completely records my words and actions. March 15, 2012 at 8:13 PM - Algis Downs, MD

## 2012-04-01 LAB — URINALYSIS W/ RFLX MICROSCOPIC
Bilirubin: NEGATIVE
Blood: NEGATIVE
Glucose: NEGATIVE MG/DL
Ketone: NEGATIVE MG/DL
Nitrites: POSITIVE — AB
Protein: NEGATIVE MG/DL
Specific gravity: 1.02 (ref 1.003–1.030)
Urobilinogen: 0.2 EU/DL (ref 0.2–1.0)
pH (UA): 6 (ref 5.0–8.0)

## 2012-04-01 LAB — INFLUENZA A & B AG (RAPID TEST)
Influenza A Antigen: NEGATIVE
Influenza B Antigen: NEGATIVE

## 2012-04-01 LAB — URINE MICROSCOPIC ONLY: WBC: 4 /HPF (ref 0–4)

## 2012-04-01 NOTE — ED Notes (Signed)
Pt. States  She had  Diarrhea, , nausea/vomiting, sore throat   Started thursday

## 2012-04-01 NOTE — ED Provider Notes (Signed)
I was personally available for consultation in the emergency department. I have reviewed the chart prior to the patient's discharge and agree with the documentation recorded by the MLP, including the assessment, treatment plan, and disposition.

## 2012-04-01 NOTE — ED Provider Notes (Signed)
Patient is a 42 y.o. female presenting with vomiting, diarrhea, and sore throat. The history is provided by the patient. No language interpreter was used.   Vomiting   This is a new problem. The current episode started 2 days ago. The problem occurs 2 to 4 times per day. The problem has been gradually improving. There has been a fever of 101 - 101.9 F. The fever has been present for 1 - 2 days. Associated symptoms include chills, abdominal pain and diarrhea. Pertinent negatives include no headaches and no headaches. The patient is not pregnant.Past medical history comments: hysterectomy in Sept 2014.   Diarrhea   Associated symptoms include abdominal pain, vomiting and chills. Pertinent negatives include no headaches. Past medical history comments: hysterectomy in Sept 2014.   Sore Throat   Associated symptoms include diarrhea and vomiting. Pertinent negatives include no congestion, no headaches and no shortness of breath.        Past Medical History   Diagnosis Date   ??? Adult ADHD    ??? Hypertension    ??? Anxiety    ??? Musculoskeletal disorder         Past Surgical History   Procedure Laterality Date   ??? Hx orthopaedic           No family history on file.     History     Social History   ??? Marital Status: SINGLE     Spouse Name: N/A     Number of Children: N/A   ??? Years of Education: N/A     Occupational History   ??? Not on file.     Social History Main Topics   ??? Smoking status: Not on file   ??? Smokeless tobacco: Not on file   ??? Alcohol Use: Not on file   ??? Drug Use: Not on file   ??? Sexually Active: Not on file     Other Topics Concern   ??? Not on file     Social History Narrative   ??? No narrative on file                  ALLERGIES: Codeine and Erythromycin      Review of Systems   Constitutional: Positive for chills and activity change.   HENT: Positive for sore throat. Negative for congestion and neck pain.    Eyes: Negative for pain, discharge and visual disturbance.   Respiratory: Negative for apnea, shortness of  breath and wheezing.    Cardiovascular: Negative for chest pain and palpitations.   Gastrointestinal: Positive for vomiting, abdominal pain and diarrhea.   Endocrine: Negative for cold intolerance and heat intolerance.   Genitourinary: Negative for urgency and flank pain.   Allergic/Immunologic: Negative for environmental allergies, food allergies and immunocompromised state.   Neurological: Negative for dizziness, seizures, syncope, weakness and headaches.   Hematological: Negative for adenopathy. Does not bruise/bleed easily.   Psychiatric/Behavioral: Negative.        Filed Vitals:    04/01/12 1248   BP: 136/89   Pulse: 86   Temp: 98.2 ??F (36.8 ??C)   Resp: 16   Height: 5\' 6"  (1.676 m)   Weight: 90.719 kg (200 lb)   SpO2: 99%            Physical Exam   Constitutional: She is oriented to person, place, and time. She appears well-developed and well-nourished.   HENT:   Head: Normocephalic and atraumatic.   Right Ear: External ear normal.  Left Ear: External ear normal.   Nose: Nose normal.   Mouth/Throat: Oropharynx is clear and moist.   Eyes: Conjunctivae and EOM are normal. Pupils are equal, round, and reactive to light.   Neck: Normal range of motion. Neck supple.   Cardiovascular: Normal rate, regular rhythm, normal heart sounds and intact distal pulses.    Pulmonary/Chest: Effort normal and breath sounds normal.   Abdominal: Soft. Normal appearance, normal aorta and bowel sounds are normal. There is no hepatosplenomegaly. There is no tenderness. There is no rigidity, no rebound, no guarding, no CVA tenderness, no tenderness at McBurney's point and negative Murphy's sign.   Musculoskeletal: Normal range of motion.   Neurological: She is alert and oriented to person, place, and time.   Skin: Skin is warm and dry.   Psychiatric: She has a normal mood and affect. Her behavior is normal. Judgment and thought content normal.        MDM     Differential Diagnosis; Clinical Impression; Plan:     Patient has ADHD,  stable on medication.      Procedures: None

## 2012-04-01 NOTE — ED Notes (Signed)
I have reviewed discharge instructions with the patient.  The patient verbalized understanding.

## 2012-05-06 MED ADMIN — chlorpheniramine-HYDROcodone (TUSSIONEX) oral suspension 5 mL: ORAL | @ 17:00:00 | NDC 99990003568

## 2012-05-06 MED ADMIN — predniSONE (DELTASONE) tablet 60 mg: ORAL | @ 17:00:00 | NDC 00143973801

## 2012-05-06 MED ADMIN — clindamycin (CLEOCIN) capsule 300 mg: ORAL | @ 17:00:00 | NDC 68084024311

## 2012-05-06 NOTE — ED Provider Notes (Signed)
I was personally available for consultation in the emergency department. I have reviewed the chart prior to the patient's discharge and agree with the documentation recorded by the MLP, including the assessment, treatment plan, and disposition.

## 2012-05-06 NOTE — ED Notes (Signed)
Pt. States she  Sinus congestion/pain, chest congestion, Sore throat for 2 weeks, toothache left lower jaw for 3 days

## 2012-05-06 NOTE — ED Provider Notes (Addendum)
HPI Comments: [name] is a  42 y.o. Obese white female h/o ADHD, HTN, Anxiety presents to the ED via private vehicle c/o sinus pain/pressure/congestion, dry cough x 2 weeks. Now experiencing left upper dental pain x 2 days. Denies fever, chills, HA, dizziness, ear pain, difficulty opening mouth, tongue swelling, ST, difficulty swallowing, choking sensation, neck pain/stiffness/swelling, CP, SOB, Palps, abd pain, n/v/d. Denies ameliorating factors. Denies h/o dental abscess. Denies having dental insurance.          Patient is a 42 y.o. female presenting with sinus pain, nasal congestion, dental problem, and other event.   Sinus Pain   Associated symptoms include congestion, sinus pressure, cough and rhinorrhea. Pertinent negatives include no chills, no ear pain, no sore throat, no shortness of breath, no headaches and no chest pain.   Nasal Congestion   Associated symptoms include congestion, sinus pressure, cough and rhinorrhea. Pertinent negatives include no chills, no ear pain, no sore throat, no shortness of breath, no headaches and no chest pain.   Dental Pain       Other  Pertinent negatives include no chest pain, no abdominal pain, no headaches and no shortness of breath.        Past Medical History   Diagnosis Date   ??? Adult ADHD    ??? Hypertension    ??? Anxiety    ??? Musculoskeletal disorder         Past Surgical History   Procedure Laterality Date   ??? Hx orthopaedic           No family history on file.     History     Social History   ??? Marital Status: SINGLE     Spouse Name: N/A     Number of Children: N/A   ??? Years of Education: N/A     Occupational History   ??? Not on file.     Social History Main Topics   ??? Smoking status: Not on file   ??? Smokeless tobacco: Not on file   ??? Alcohol Use: Not on file   ??? Drug Use: Not on file   ??? Sexually Active: Not on file     Other Topics Concern   ??? Not on file     Social History Narrative   ??? No narrative on file                  ALLERGIES: Codeine and  Erythromycin      Review of Systems   Constitutional: Negative for fever and chills.   HENT: Positive for congestion, rhinorrhea, dental problem and sinus pressure. Negative for ear pain, sore throat, drooling, mouth sores, trouble swallowing and voice change.    Eyes: Negative for pain and visual disturbance.   Respiratory: Positive for cough. Negative for chest tightness, shortness of breath and wheezing.    Cardiovascular: Negative for chest pain and palpitations.   Gastrointestinal: Negative for nausea, vomiting, abdominal pain and diarrhea.   Genitourinary: Negative for dysuria, urgency and frequency.   Musculoskeletal: Negative for joint swelling and arthralgias.   Skin: Negative for color change, pallor, rash and wound.   Neurological: Negative for dizziness and headaches.   Psychiatric/Behavioral: Negative for behavioral problems. The patient is not nervous/anxious.        Filed Vitals:    05/06/12 1112   BP: 134/98   Pulse: 88   Temp: 97.9 ??F (36.6 ??C)   Resp: 16   Height: 5\' 6"  (1.676 m)  Weight: 90.719 kg (200 lb)   SpO2: 100%            Physical Exam   Nursing note and vitals reviewed.  Constitutional: She is oriented to person, place, and time. She appears well-developed and well-nourished. No distress.   HENT:   Head: Normocephalic and atraumatic. No trismus in the jaw.   Right Ear: Hearing, tympanic membrane, external ear and ear canal normal.   Left Ear: Hearing, tympanic membrane, external ear and ear canal normal.   Nose: Mucosal edema and rhinorrhea present. Right sinus exhibits maxillary sinus tenderness. Right sinus exhibits no frontal sinus tenderness. Left sinus exhibits maxillary sinus tenderness. Left sinus exhibits no frontal sinus tenderness.   Mouth/Throat: Uvula is midline, oropharynx is clear and moist and mucous membranes are normal. Abnormal dentition. Dental caries present. No dental abscesses or edematous. No oropharyngeal exudate, posterior oropharyngeal edema, posterior  oropharyngeal erythema or tonsillar abscesses.   Dental pain at # 17.   There IS evidence of dental decay.   There IS tenderness on palpation.  The airway IS patent.   NO adjacent mucobuccal soft tissue swelling, fluctuance or gingival bleeding.   NO pus/drainage.   NO swelling or elevation of the tongue.   NO sublingual swelling or TTP.   NO facial swelling.  NO neck swelling.        Eyes: Conjunctivae and EOM are normal. Pupils are equal, round, and reactive to light. Right eye exhibits no discharge. Left eye exhibits no discharge.   Neck: Trachea normal, normal range of motion, full passive range of motion without pain and phonation normal. Neck supple. No tracheal tenderness, no spinous process tenderness and no muscular tenderness present. No rigidity. No tracheal deviation, no edema, no erythema and normal range of motion present. No mass and no thyromegaly present.   Cardiovascular: Normal rate, regular rhythm and normal heart sounds.  Exam reveals no gallop and no friction rub.    No murmur heard.  Pulmonary/Chest: Effort normal and breath sounds normal. No accessory muscle usage or stridor. Not tachypneic. No respiratory distress. She has no decreased breath sounds. She has no wheezes. She has no rhonchi. She has no rales.   Symmetric rise/fall of the chest wall. Speaks in full sentences. Great inspiratory effort.     Abdominal: Soft. Bowel sounds are normal. She exhibits no mass. There is no tenderness. There is no rigidity, no rebound, no guarding, no CVA tenderness, no tenderness at McBurney's point and negative Murphy's sign.   Lymphadenopathy:     She has no cervical adenopathy.   Neurological: She is alert and oriented to person, place, and time. She has normal strength and normal reflexes. No sensory deficit.   Skin: Skin is warm and dry. She is not diaphoretic.   Psychiatric: She has a normal mood and affect. Her behavior is normal.        MDM     Differential Diagnosis; Clinical Impression; Plan:      DDX: Sinusitis, URI, Allergic Rhinitis, Epistaxis, Viral Syndrome, Nasal FB.    DDX: Asthma Exacerbation, Status Asthmaticus, Allergic Rhinitis, Sinusitis, Pleuritis, Pleurisy, Pneumothorax, Pneumonia, Bronchitis, Pleurodynia, Pericarditis, Pleural Effusion, Atelectasis, Pericardial Effusion, Gastro-esophageal spasm, Esophagitis, Gastritis, Airway Foreign Body.     DDX: Periapical abscess, Trigeminal neuralgia, Masticator space infection, Ludwig's angina, Retropharyngeal space infection, Infection after root canal, Dental caries, Odontalgia, Dry Socket, Acute Necrotizing Ulcerative Gingivitis (ANUG).    Clindamycin 300 mg QID x 10 days. Tylenol/Motrin prn pain. Use your current Pain Medication  for management of your pain. F/U Dental in 1 day. Drink plenty of water. Soft foods only. Luke warm solids & liquids. Warm salt water rinses TID. Listerine rinses TID. RT ED with any worsening s/sx.     Albuterol MDI w/ spacer Q4 H PRN + Prednisone 60 mg QD x 5 days + Sinus rinses.     The patient has had a high blood pressure reading in the emergency department. He or she will be referred to their PCP or to a local clinic for management of their elevated blood pressure reading. . If they currently take hypertensive medication they will be encouraged to take their medications. The diagnosis of hypertension requires multiple readings of elevation over a longer period of time than the brief period spent in the emergency department.     PLEASE FOLLOW-UP AS DIRECTED WITHOUT FAIL WITHIN THE TIME FRAME RECOMMENDED AS FAILURE TO DO SO COULD RESULT IN WORSENING OF YOUR PHYSICAL CONDITION, DEATH, AND OR PERMANENT DISABILITY.     RETURN TO THE EMERGENCY DEPARTMENT IF YOU ARE UNABLE TO FOLLOW-UP AS DIRECTED.     RETURN TO THE EMERGENCY DEPARTMENT IF YOU HAVE SYMPTOMS THAT DO NOT IMPROVE WITH TREATMENT, NEW SYMPTOMS, WORSENING SYMPTOMS, OR ANY OTHER CONCERNS.             Amount and/or Complexity of Data Reviewed:    Decide to obtain  previous medical records or to obtain history from someone other than the patient:  Yes   Obtain history from someone other than the patient:  No   Review and summarize past medical records:  Yes   Discuss the patient with another provider:  No   Independant visualization of image, tracing, or specimen:  Yes  Risk of Significant Complications, Morbidity, and/or Mortality:   Presenting problems:  Moderate  Diagnostic procedures:  Low  Management options:  Moderate      Procedures

## 2012-05-06 NOTE — ED Notes (Signed)
I have reviewed discharge instructions with the patient.  The patient verbalized understanding.  Patient armband removed and shredded.  Pt is ambulatory with NAD noted and VSS. Patient is being discharged with 6 prescriptions at this time.

## 2012-07-29 NOTE — ED Provider Notes (Signed)
[facility]  EMERGENCY DEPARTMENT TREATMENT REPORT  NAME:  [name]  SEX:   F  ADMIT: 07/28/2012  DOB:   Jul 08, 1970  MR#    [id number]  ROOM:    TIME SEEN: 04 08 PM  ACCT#  [id number]    cc: Trenton Founds MD    TIME OF EVALUATION:  1150    CHIEF COMPLAINT:  Left shoulder pain.    HISTORY OF PRESENT ILLNESS:  A 42 year old right-handed female complaining of pain to her left shoulder.    She has been moving furniture all day today.  While she was pushing the   refrigerator, she developed pain to posterior aspect of her left trapezius   muscle that is very tender to palpate.  This pain additionally seems to   radiate down the lateral aspect of her left shoulder, to about mid aspect of   her upper arm.  Pain to shoulder is rated 8 out of 10 achy in nature, worse   with movement.  She denies any direct trauma or injury to the shoulder joint.   No history of issues with the shoulder in the past.  She did take Percocet and   Naprosyn for her pain prior to arrival.  As she takes these medications on a   regular basis as she has chronic pain to her low back. Medications are given   by her PCP.  She is technically not in any pain management program.  She did   not have much improvement after she took her home pain medications which is   why she comes to the Emergency Department for further evaluation.    REVIEW OF SYSTEMS:  MUSCULOSKELETAL:  As above.  No other joint pain.  SKIN:  No lacerations or abrasions.  NEUROLOGIC:  No sensory or motor symptoms.  CARDIOVASCULAR:  No chest pain.    PAST MEDICAL HISTORY:  Migraine workup with CT tubal ligation, anxiety, ADHD currently under   outpatient psychiatric treatment, chronic pain to low back. Pain medications   managed by her PCP.    SOCIAL HISTORY:  No tobacco, alcohol or recreational drug use.    FAMILY HISTORY:  Noncontributory.    ALLERGIES:  CODEINE SULFATE, ERYTHROMYCIN.    MEDICATIONS:  Percocet, Naprosyn, Xanax, Adderall.    PHYSICAL EXAMINATION:   VITAL SIGNS:  BP 142/98, pulse 95, respirations 20, temperature 97.7, pain 8,   O2 sat 98% on room air.  GENERAL APPEARANCE:  The patient appears well developed, well nourished.  RESPIRATORY:  Clear and equal bilaterally.  CARDIOVASCULAR:  Heart regular without murmurs, gallops, rubs or thrills.  MUSCULOSKELETAL:  Left upper extremity is symmetrical  when compared to the   right.  Left shoulder has no visual deformity, ecchymosis, erythema or edema.    No bony tenderness is present to left shoulder.  There is focal tenderness to   left trapezius muscle and she does appear to have some muscle spasming along   with area.  Generalized tenderness to upper lateral aspect of left arm   throughout the deltoid muscle area.  No other focal tenderness is noted.  No   vertebral bony tenderness.  No focal tenderness over AC joint or to proximal   humerus.  Shoulder range of motion slightly limited flexion and abduction due   to pain.  Internal and external rotation intact from 0 to 90 degrees.  No   elbow or wrist pain.   SKIN:  Upper extremities are pink, warm and dry.  No peripheral edema.  NEUROLOGICAL:  Light sensation is intact throughout dermatomes of upper   extremities bilaterally.  Upper extremity strength equal bilaterally.  Radial   pulses brisk and equal.  Nail beds are pink with prompt capillary refill.    ASSESSMENT AND MANAGEMENT DEPARTMENT COURSE:  The patient complaining of left shoulder pain that is consistent with muscle   strain.  She has no bony tenderness to the shoulder joint and has not had any   trauma or injury; therefore, I do not feel any imaging is necessary.  Upper   extremity is peripherally neurovascularly intact with good pulses and   capillary refill.  I do not suspect any vascular or infectious etiology.  We   will treat symptomatically.  As she is currently in pain management, we will   discuss treatment options with Dr. Hervey Ard.    COURSE IN THE EMERGENCY DEPARTMENT:   The patient did have a ride present, therefore, was medicated with Percocet   325/5 mg p.o. here in the Emergency Department.  The patient was reviewed in   for [place] PMP systems and while she does receive Percocet once a month from   her primary care physician she does not appear to be a drug seeking a   patient.  Therefore, we will go ahead and also provide her with limited dose   of additional Percocet pills as well as add muscle relaxer to help with her   muscle spasming.  Furthermore, we will have her continue her Naprosyn for pain   and muscle inflammation.  A sling was given.  She is to wear a sling for 2 to   3 days to allow shoulder rest.  Should then remove the arm sling and begin   gentle movement.  Should avoid heavy lifting or straining until pain has   completely resolved.  The patient is agreeable with this plan.    FINAL DIAGNOSIS:  Shoulder strain.    DISPOSITION AND PLAN:  The patient discharged home in stable condition with verbal and written   instructions for ongoing care.  She is aware she may return at any time for   new or worsening symptoms.  Was warned not to drive or operate machinery while   medicated on Percocet or Robaxin.  Only 8 tablets of Percocet were   prescribed.  We did politely explain that additional refills of narcotic pain   medications would need to come from her primary care physician.  Therefore, if   she is still experiencing pain next week, she should follow up with  Dr.   Dione Housekeeper and/or the referral orthopedist.      The patient was personally evaluated by myself and Dr. Hervey Ard who agrees   with the above assessment and plan.      ___________________  Tana Conch MD  Dictated By: Verlin Grills, PA    My signature above authenticates this document and my orders, the final   diagnosis (es), discharge prescription (s), and instructions in the [facility]   [facility] record.  [facility]  D:07/28/2012  T: 07/29/2012 10:54:03  [id number]   Authenticated by Tana Conch, MD On 08/02/2012 04:28:16 PM

## 2012-08-22 LAB — POC URINE MICROSCOPIC

## 2012-08-22 LAB — POC URINE MACROSCOPIC
Bilirubin: NEGATIVE
Glucose: NEGATIVE mg/dl
Ketone: NEGATIVE mg/dl
Nitrites: POSITIVE — AB
Protein: 100 mg/dl — AB
Specific gravity: 1.025 (ref 1.005–1.030)
Urobilinogen: 0.2 EU/dl (ref 0.0–1.0)
pH (UA): 5.5 (ref 5–9)

## 2012-08-22 LAB — CBC WITH AUTOMATED DIFF
BASOPHILS: 0 % (ref 0–3)
EOSINOPHILS: 0 % (ref 0–5)
HCT: 38.8 % (ref 37.0–50.0)
HGB: 13.3 gm/dl (ref 13.0–17.2)
LYMPHOCYTES: 8 % — ABNORMAL LOW (ref 28–48)
MCH: 29.3 pg (ref 25.4–34.6)
MCHC: 34.1 gm/dl (ref 30.0–36.0)
MCV: 86 fL (ref 80.0–98.0)
MONOCYTES: 10 % (ref 1–13)
MPV: 9.1 fL (ref 6.0–10.0)
NEUTROPHILS: 82 % — ABNORMAL HIGH (ref 34–64)
NRBC: 0 (ref 0–0)
PLATELET: 268 10*3/uL (ref 140–450)
RBC: 4.52 M/uL (ref 3.60–5.20)
RDW: 12.8 % (ref 11.5–14.0)
WBC: 12.1 10*3/uL — ABNORMAL HIGH (ref 4.0–11.0)

## 2012-08-22 LAB — METABOLIC PANEL, BASIC
BUN: 6 mg/dl — ABNORMAL LOW (ref 7–25)
CO2: 25 mEq/L (ref 21–32)
Calcium: 8.5 mg/dl (ref 8.5–10.1)
Chloride: 106 mEq/L (ref 98–107)
Creatinine: 0.8 mg/dl (ref 0.6–1.3)
GFR est AA: >60
GFR est non-AA: >60
Glucose: 106 mg/dl (ref 74–106)
Potassium: 3.2 mEq/L — ABNORMAL LOW (ref 3.5–5.1)
Sodium: 138 mEq/L (ref 136–145)

## 2012-08-22 NOTE — ED Provider Notes (Signed)
[facility]  EMERGENCY DEPARTMENT TREATMENT REPORT  NAME:  [name]  SEX:   F  ADMIT: 08/22/2012  DOB:   Dec 21, 1970  MR#    [id number]  ROOM:    TIME SEEN: 10 03 AM  ACCT#  [id number]        TIME SEEN:   8 a.m. on 08/22/2012    CHIEF COMPLAINT:  Fever of 104.    HISTORY OF PRESENT ILLNESS:    The patient is a 42 year old female presenting to the ER complaining of fever   of 104 and severe mid back pain.  The patient states she has chronic lower   back pain, but this pain is different from her usual and again 2 days ago.    The patient has had difficulty sleeping and has not had pain relief with her   usual Percocet.  The patient states she has not had cough or cold symptoms.    No shortness of breath.  No chest pain.  The patient does note that she   recently has begun weaning off her Xanax that she uses for anxiety.  The   patient follows with psychiatrist, Dr. Carlena Sax.  The patient has fully, over   the past week, weaned off the Xanax and continued to take Klonopin for anxiety   symptoms.  The patient states she does not have any abdominal pain, no   dysuria or urinary frequency.  The patient states that her temperature was   104, per EMS and that she had a subjective fever at home.  The patient has not   had any sick contacts.  The patient does note she has had a poor appetite.      REVIEW OF SYSTEMS:    CONSTITUTIONAL:  Positive fever.  No chills.   ENT: No sore throat, runny nose or URI symptoms.    RESPIRATORY:  No cough, shortness of breath or wheezing.   CARDIOVASCULAR:  No chest pain.  GASTROINTESTINAL:  No vomiting, diarrhea or abdominal pain.   GENITOURINARY:  No dysuria, frequency or urgency.   MUSCULOSKELETAL:   Positive back pain.  INTEGUMENTARY:  No rashes.  NEUROLOGICAL:  No headaches.      PAST MEDICAL HISTORY:     Migraines.    SURGICAL HISTORY:  Hysterectomy, tubal ligation.    PSYCHIATRIC HISTORY:  Anxiety, currently under treatment by Dr. Carlena Sax.      SOCIAL HISTORY:     The patient denies tobacco use, alcohol and illicit drug use.      CURRENT MEDICATIONS:  Percocet, Xanax, Adderall, Klonopin, Ambien and Clonidine.     ALLERGIES:  CODEINE, ERYTHROMYCIN.    PHYSICAL EXAMINATION:   VITAL SIGNS:   Blood pressure 124/81, pulse 85, respirations 18, temperature   102.5.  O2 saturation 100% on room air.  Pain is 8 out of 10.    CONSTITUTIONAL:  The patient is well developed and well nourished.  The   patient is groaning in pain.    HEENT: Eyes:  Conjunctivae clear, lids normal.  Pupils are PERRL.    Mouth/Throat:  Surfaces of the pharynx, palate, and tongue are pink, moist,   and without lesions. Teeth and gums unremarkable.    LYMPHATIC: No cervical lymphadenopathy.    RESPIRATORY:  Clear and equal breath sounds. No respiratory distress.  No   rales or rhonchi.   CARDIOVASCULAR:  Heart regular rate and rhythm.  No murmurs appreciated.   GASTROINTESTINAL:  Abdomen is soft, nontender.  No rigidity or  guarding.  No   palpable masses.    MUSCULOSKELETAL:   Positive tenderness to palpation left paraspinal muscles   thoracic.  No localized cervical, thoracic, or lumbar bony tenderness to   palpation.  no bony stepoffs, ecchymosis, areas of soft tissue swelling or   deformity.    SKIN: Warm and dry without rashes.    NEUROLOGIC:  The patient is alert and oriented.  No facial asymmetry or   dysarthria.   PSYCHIATRIC:  The patient appears anxious, but overall judgment appears   appropriate.    INITIAL ASSESSMENT AND MANAGEMENT PLAN:    We will obtain a urine dip, CBC, BMP.  Will give the patient Toradol and   Percocet for pain.       CONTINUATION BY DR.  Cola Highfill:     INITIAL ASSESSMENT:  A patient with bilateral flank pain and fever and body aches, suspect   pyelonephritis.  I think it is unlikely and she has no high risk behaviors for   a spinal epidural abscess.  This is a new problem for this patient.  Old   records were reviewed.  No additional relevant information was obtained.      RESULTS:  Urinalysis shows 30 to 50 white cells, 4+ bacteria, positive nitrites,   positive leukocyte esterase, moderate blood.  BMP:  Potassium 3.2, BUN 6,   otherwise normal.  CBC:  White count 12.1, otherwise normal.     MEDICAL DECISION MAKING AND HOSPITAL COURSE:   The patient was given IV Rocephin, morphine, oral Percocet, IV Toradol and   over time she is feeling significantly better.  I asked her whether she fell   well enough to go home and she stated yes.  I am going to send her home with a   prescriptions for Bactrim, Percocet and ibuprofen.    DIAGNOSES:   1.  Acute pyelonephritis.  2.  Controlled benzodiazepine withdrawal.      CONTINUED BY MAE M. SHAW, PA-C     DIAGNOSTIC INTERPRETATIONS:     Revealed urine dip with 100 mg/dL protein, moderate amounts of blood, positive   nitrites and small amounts of leukocyte esterase.  Urine microscopic revealed   10 to 14 squamous epithelial cells per low-power field and 30 to 49 white   blood cells per high-power field as well as 1 to 4 red blood cells and 4+   bacteria per high-power field.  BMP revealed a potassium of 3.2, BUN 6.  CBC   was significant for white blood cell count 12.1, segmented neutrophils of 82,   lymphocytes of 8.    COURSE IN THE EMERGENCY DEPARTMENT:     The patient was initially given IV Toradol and her home dose of Percocet for   pain.  She received minimal pain relief and was given a repeat dose of   Percocet later followed by 8 mg IV morphine.  The patient was given 1 gram of   IV Rocephin due to her laboratory results and findings suggestive of   pyelonephritis.      DIAGNOSES:   1.  Acute pyelonephritis.  2.  Back pain.  3.  Controlled benzodiazepine withdrawal.    DISPOSITION:  The patient was discharged to home with a prescription for Bactrim to be taken   for 10 days.  The patient was also given Percocet as needed for pain as well   as ibuprofen.  The patient is to keep her upcoming appointment with    psychiatrist, Dr.  Carlena Sax to further discuss her benzodiazepine withdrawal.    The patient is to return to the Emergency Department if she experiences   worsening of symptoms despite treatment with antibiotics.  The patient is to   followup with her primary care doctor, Dr. Lalla Brothers.  The patient was   personally evaluated by myself and Dr. Dixie Dials who agrees with the   above assessment and plan.      ___________________  Christiana Pellant MD  Dictated By: Debbora Presto. Clelia Croft, PA-C    My signature above authenticates this document and my orders, the final   diagnosis (es), discharge prescription (s), and instructions in the [facility]   Pul[facility]heck record.  zga  D:08/22/2012  T: 08/22/2012 14:31:13  [id number]  Authenticated by Carlis Stable. Carmela Hurt, M.D. On 08/23/2012 09:37:33 AM

## 2012-08-24 LAB — POC URINE MACROSCOPIC
Bilirubin: NEGATIVE
Glucose: NEGATIVE mg/dl
Ketone: NEGATIVE mg/dl
Leukocyte Esterase: NEGATIVE
Nitrites: NEGATIVE
Protein: NEGATIVE mg/dl
Specific gravity: 1.015 (ref 1.005–1.030)
Urobilinogen: 0.2 EU/dl (ref 0.0–1.0)
pH (UA): 6 (ref 5–9)

## 2012-08-24 LAB — CBC WITH AUTOMATED DIFF
BASOPHILS: 0 % (ref 0–3)
EOSINOPHILS: 1 % (ref 0–5)
HCT: 37 % (ref 37.0–50.0)
HGB: 12.1 gm/dl — ABNORMAL LOW (ref 13.0–17.2)
LYMPHOCYTES: 22 % — ABNORMAL LOW (ref 28–48)
MCH: 28.6 pg (ref 25.4–34.6)
MCHC: 32.6 gm/dl (ref 30.0–36.0)
MCV: 87.7 fL (ref 80.0–98.0)
MONOCYTES: 12 % (ref 1–13)
MPV: 9.8 fL (ref 6.0–10.0)
NEUTROPHILS: 65 % — ABNORMAL HIGH (ref 34–64)
NRBC: 0 (ref 0–0)
PLATELET: 275 10*3/uL (ref 140–450)
RBC: 4.22 M/uL (ref 3.60–5.20)
RDW: 13.3 % (ref 11.5–14.0)
WBC: 7.3 10*3/uL (ref 4.0–11.0)

## 2012-08-24 LAB — METABOLIC PANEL, BASIC
BUN: 9 mg/dl (ref 7–25)
CO2: 27 mEq/L (ref 21–32)
Calcium: 8.6 mg/dl (ref 8.5–10.1)
Chloride: 107 mEq/L (ref 98–107)
Creatinine: 0.7 mg/dl (ref 0.6–1.3)
GFR est AA: >60
GFR est non-AA: >60
Glucose: 90 mg/dl (ref 74–106)
Potassium: 3.1 mEq/L — ABNORMAL LOW (ref 3.5–5.1)
Sodium: 141 mEq/L (ref 136–145)

## 2012-08-24 LAB — LACTIC ACID: Lactic Acid: 1.1 mmol/L (ref 0.4–2.0)

## 2012-08-24 LAB — POC HCG,URINE: HCG urine, QL: NEGATIVE

## 2012-08-25 NOTE — ED Provider Notes (Signed)
[facility]  EMERGENCY DEPARTMENT TREATMENT REPORT  NAME:  [name]  SEX:   F  ADMIT: 08/24/2012  DOB:   [birth year]  MR#    [id number]  ROOM:    TIME SEEN: 10 12 PM  ACCT#  [id number]        TIME OF EVALUATION:  3 p.m.     CHIEF COMPLAINT:  Vomiting and bilateral flank pain.    HISTORY OF PRESENT ILLNESS:  The patient is a 42 year old female who was recently seen in the Emergency   Department for flank pain and vomiting.  She was diagnosed with acute   pyelonephritis.  She was given Bactrim, Motrin, Percocet and discharged home.    She states that since she left 2 days ago she has had persistent fevers that   are controlled with Tylenol.  She has had some nausea and vomiting.  The flank   pain persists, but it was controlled with Percocet.  She states that today   she began to have vomiting and had vomited multiple times.  Her husband came   home and found her weak and woozy on the floor after vomiting.  They returned   to the Emergency Department.  She reports fevers and chills and she has been   taking her Bactrim.    REVIEW OF SYSTEMS:    CONSTITUTIONAL:  She complains of fevers, chills, loss of appetite.  See HPI.  ENT:  No sore throat, runny nose, or other URI symptoms.  RESPIRATORY:  No cough, shortness of breath, or wheezing.  CARDIOVASCULAR:  No chest pain, chest pressure, or palpitations.  GASTROINTESTINAL:  See HPI.    GENITOURINARY:  See HPI.  INTEGUMENTARY:  No rashes.  NEUROLOGICAL:  No headaches, sensory or motor symptoms.  PSYCHIATRIC:  No suicidal or homicidal ideation.  Denies complaints in all other systems.    PAST MEDICAL HISTORY:  Migraines, hysterectomy and tubal ligation, anxiety and ADHD.    SOCIAL HISTORY:  She denies smoking, alcohol and drug abuse.    FAMILY HISTORY:  Noncontributory in this case.    PHYSICAL EXAMINATION:  VITAL SIGNS:  118/78, pulse 87, respirations 18, temperature 98.6, O2   saturation are 90% on room air, pain is 8 out of 10.   GENERAL APPEARANCE:  Patient appears well developed and well nourished.    Appearance and behavior are age and situation appropriate.  Eyes:  Conjunctivae clear, lids normal.  Pupils equal, symmetrical, and   normally reactive.  Mouth/Throat:  Surfaces of the pharynx, palate, and tongue are pink, moist,   and without lesions.  RESPIRATORY:  Clear and equal breath sounds.  No respiratory distress,   tachypnea, or accessory muscle use.    CARDIOVASCULAR:   Heart regular, without murmurs, gallops, rubs, or thrills.    No peripheral edema or significant varicosities.  CHEST:  Chest symmetrical without masses or tenderness.  GI:  Abdomen is soft, tender in the left upper quadrant.  Positive left cva   tenderness.  No abdominal masses.  MUSCULOSKELETAL:  Moves all extremities with ease.  No swelling or injury.    SKIN:  Warm.  She has somewhat diaphoretic as she had just been vomiting at   the time of exam.  NEUROLOGIC:  Alert, oriented.  Sensation intact, motor strength equal and   symmetric.  DTR intact.  There is no facial asymmetry or dysarthria.    PSYCHIATRIC:  Judgment appears appropriate.  Recent and remote memory appear to be intact.  Oriented  to time, place and person.  Mood and affect appropriate.    ASSESSMENT AND PLAN:  The patient was seen and examined by myself and Dr. Arvella Merles.  This is an acute   exacerbation of vomiting.  The old records were reviewed by myself and Dr.   Arvella Merles.    DIAGNOSTIC INTERPRETATIONS:  Negative for pregnancy.  She had some trace blood in her urine.  Her   creatinine was not elevated.  Her lactic acid was 1.1 and not elevated.  Blood   cultures were drawn.    EMERGENCY DEPARTMENT COURSE:  The patient was seen and examined by myself and Dr. Arvella Merles.  She had intractable   vomiting.  She does not appear to be septic; however, we will obtain blood   cultures and lactic acid.  We will treat the patient to vomiting and pain and    evaluate her for renal failure, worsening pyelonephritis and determine   inpatient versus outpatient course.    EMERGENCY DEPARTMENT COURSE:  The patient was seen and examined by myself and Dr. Arvella Merles.  She was vomiting at   the time of exam.  She was given parenteral medications including Zofran,   morphine and Toradol.  She had a significant decrease in her vomiting and   pain.  Her labs were normal.  She was given a dose of Levaquin in the   Emergency Department this evening.  Discussed observation admission versus   discharge home with the patient at length.  She felt that she could tolerate   p.o. medications if she had a prescription for Phenergan at home.  The patient   was given Phenergan, Pepcid and Percocet for pain for home.  prescriptions.    DIAGNOSIS:  1.  Acute gastritis.  2.  Nausea and vomiting.  3.  Bilateral flank pain.    DISPOSITION:  Discharged home in stable condition.  The patient was discharged with verbal   and written instructions and referral for ongoing care.      ____    The patient was personally evaluated by myself and Dr. Arvella Merles who agrees with   the above assessment and plan.      ___________________  Smitty Cords MD  Dictated By: Crecencio Mc, PA-C    My signature above authenticates this document and my orders, the final   diagnosis (es), discharge prescription (s), and instructions in the [facility]   Pulsecheck record.    If you have any questions please contact [phone].    RA  D:08/24/2012  T: 08/25/2012 04:42:55  [id number]  Authenticated by Smitty Cords, M.D. On 08/29/2012 05:55:56 PM

## 2012-09-08 NOTE — Progress Notes (Signed)
09/08/12 Tried to reach out to pt in regards to her ins provider, no answer, LMOM @ 10:04am

## 2012-09-12 NOTE — Progress Notes (Signed)
09/12/12 Tried to reach out to pt in regards to her ins, [facility] @ 12:14pm on work # and female answered hm/cell # and stated that I had the wrong #

## 2012-09-22 MED ADMIN — HYDROmorphone (PF) (DILAUDID) injection 1 mg: INTRAMUSCULAR | @ 14:00:00 | NDC 00409128331

## 2012-09-22 MED ADMIN — cyclobenzaprine (FLEXERIL) tablet 10 mg: ORAL | @ 14:00:00 | NDC 68084039711

## 2012-09-22 MED FILL — CYCLOBENZAPRINE 10 MG TAB: 10 mg | ORAL | Qty: 1

## 2012-09-22 MED FILL — HYDROMORPHONE (PF) 1 MG/ML IJ SOLN: 1 mg/mL | INTRAMUSCULAR | Qty: 1

## 2012-09-22 NOTE — ED Notes (Signed)
Discharge instructions discussed with patient. Patient verbalizes understanding

## 2012-09-22 NOTE — ED Provider Notes (Signed)
Patient is a 42 y.o. female presenting with back pain.   Back Pain          42 yo female to the ER with c/o Right lower back pain x yesterday after moving furniture, pt reports that she has chronic lower back pain, takes percocet, under the care of Dr Dione Housekeeper, ran out of percocet yesterday, pt reports that her pain is exacerbated with movement, improved with rest,pt denies any fever, nausea, vomiting, diarrhea, CP,SOB, HA, dizziness, throat closing, difficulty breathing/swallowing, hemoptysis, hematemesis, bloody stools, new injury/surgery/trauma, saddle paresthesias, weakness, incontinence or any other concerns, pt has taken no meds PTA, pt didn't drive to the ER.     Past Medical History   Diagnosis Date   ??? Adult ADHD    ??? Hypertension    ??? Anxiety    ??? Musculoskeletal disorder         Past Surgical History   Procedure Laterality Date   ??? Hx orthopaedic           History reviewed. No pertinent family history.     History     Social History   ??? Marital Status: MARRIED     Spouse Name: N/A     Number of Children: N/A   ??? Years of Education: N/A     Occupational History   ??? Not on file.     Social History Main Topics   ??? Smoking status: Never Smoker    ??? Smokeless tobacco: Not on file   ??? Alcohol Use: Yes   ??? Drug Use: No   ??? Sexually Active: Not on file     Other Topics Concern   ??? Not on file     Social History Narrative   ??? No narrative on file                  ALLERGIES: Codeine and Erythromycin      Review of Systems   Musculoskeletal: Positive for back pain.     ROS   Constitutional:?? Denies malaise, fever, chills.   Head:?? Denies injury.   Face:?? Denies injury or pain.   ENMT:?? Denies sore throat.   Neck:?? Denies injury or pain.   Chest:?? Denies injury.   Cardiac:?? Denies chest pain or palpitations.   Respiratory:?? Denies cough, wheezing, difficulty breathing, shortness of breath.   GI/ABD:?? Denies injury, pain, distention, nausea, vomiting, diarrhea.   GU:?? Denies injury, pain, dysuria or urgency.   Back:??  Denies injury or pain.   Pelvis:?? Denies injury or pain.   Extremity/MS:??see HPI  Neuro:?? Denies headache, LOC, dizziness, neurologic symptoms/ deficits/ paresthesias.   Skin: Denies injury, rash, itching or skin changes.       Filed Vitals:    09/22/12 0912 09/22/12 0931   BP: 125/92 133/78   Pulse: 119 98   Temp: 97.7 ??F (36.5 ??C)    Resp: 16 16   Height: 5\' 6"  (1.676 m)    Weight: 90.719 kg (200 lb)    SpO2: 99% 98%            Physical Exam   Nursing note and vitals reviewed.  Constitutional: She is oriented to person, place, and time. She appears well-developed and well-nourished.   HENT:   Head: Normocephalic and atraumatic.   Right Ear: External ear normal.   Left Ear: External ear normal.   Nose: Nose normal.   Mouth/Throat: Oropharynx is clear and moist.   +uvula midline, +able to handle oral secretions without difficulty, +  airway patent   Eyes: Conjunctivae are normal. Pupils are equal, round, and reactive to light.   Neck: Normal range of motion. Neck supple.   Cardiovascular: Normal rate, regular rhythm and normal heart sounds.    Pulmonary/Chest: Effort normal and breath sounds normal.   Lungs CTA bilaterally, no wheezing rhonchi or rales   Abdominal: Soft. Bowel sounds are normal. There is no tenderness. There is no rebound and no guarding.   Musculoskeletal:   BACK: FROM in back with no midline spinal tenderness, +right lumbar paraspinal muscle tenderness, no swelling, bruising, ecchymosis, deformity, or step off noted    EXT: FROM in BUE and BLE with no tenderness, swelling, bruising, ecchymosis, deformity, dislocation or open wound noted, neg SLR bilaterally, motor 5/5, no calf tenderness. nv intact, no sensory deficits noted   Neurological: She is alert and oriented to person, place, and time.   Skin: Skin is warm and dry.   Psychiatric: She has a normal mood and affect. Her behavior is normal.        MDM     Differential Diagnosis; Clinical Impression; Plan:     DDX: Sciatica, Lumbar radiculopathy,  Back Sprain, Back Strain, DJD, HNP, Epidural abscess, Cauda Equina Syndrome, Contusion, Pyelonephritis, Renal Colic, Rib Fracture, Rib Contusion, Costochondritis, Pleurisy, Pleurodynia, Pneumothorax, Thoracic Aneurysm, Abdominal Aortic Aneurysm, Nephro-ureteral Calculus, Acute Myocardial Infarction.     Plan: pt presented to the ER with acute on chronic lower back pain after lifting furniture, physical exam: +right lumbar paraspinal muscle tenderness, no swelling, bruising, ecchymosis, deformity, or step off noted, normal extremity exam, will discharge with percocet, flexeril and outpatient f/u  Amount and/or Complexity of Data Reviewed:   Discussion of test results with the performing providers:  No   Decide to obtain previous medical records or to obtain history from someone other than the patient:  Yes   Obtain history from someone other than the patient:  No   Review and summarize past medical records:  Yes   Discuss the patient with another provider:  No   Independant visualization of image, tracing, or specimen:  No  Risk of Significant Complications, Morbidity, and/or Mortality:   Presenting problems:  Low  Diagnostic procedures:  Low  Management options:  Low  Progress:   Patient progress:  Stable      Procedures    Progress Note    Patient is improved, resting quietly and comfortably, Reviewed all workup results, including any lab or radiology results, meds given and/or prescribed, and discharge instructions with patient and any family present. Patient and any family present advised to return here immediately at any time for recurrent symptoms, new symptoms, any concerns, or if unable to obtain follow-up as directed. Patient expresses understanding and agrees to proceed with discharge plan. Zella Richer, PA 9:41 AM        Lashunda Greis, PA  September 22, 2012  9:41 AM

## 2012-09-22 NOTE — ED Notes (Signed)
Pt.  States she was moving furniture yesterday, hurt her back,   Lower & right side of lower back

## 2012-09-22 NOTE — ED Provider Notes (Signed)
I was personally available for consultation in the emergency department. I have reviewed the chart prior to the patient's discharge and agree with the documentation recorded by the MLP, including the assessment, treatment plan, and disposition.

## 2012-09-29 NOTE — Progress Notes (Signed)
09/29/12 Tried to reach out to provider to fax over office notes, office closed at 12pm today, LMOM @ 2:10pm

## 2012-10-04 NOTE — Progress Notes (Signed)
10/04/12 Spoke with Magaly, she stated that she was not aware of this referral and will fax over office notes/labs ([facility] fax/phone #'s provided) and will also need to check with Dr. Elise Benne

## 2012-10-16 NOTE — Progress Notes (Signed)
10/16/12 Spoke with Magaly today, provided [facility]'s phone/fax #'s, she stated that she would fax over last office notes and labs in regards to reason for referral

## 2012-10-20 MED ADMIN — ketorolac tromethamine (TORADOL) 60 mg/2 mL injection 60 mg: INTRAMUSCULAR | @ 21:00:00 | NDC 00409379601

## 2012-10-20 MED ADMIN — oxyCODONE-acetaminophen (PERCOCET) 5-325 mg per tablet 1 Tab: ORAL | @ 21:00:00 | NDC 68084035511

## 2012-10-20 NOTE — ED Notes (Signed)
Pt. 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

## 2012-10-20 NOTE — ED Notes (Signed)
I have reviewed discharge instructions with the patient.  The patient verbalized understanding. Patient armband removed and given to patient to take home.  Patient was informed of the privacy risks if armband lost or stolen

## 2012-10-20 NOTE — ED Provider Notes (Addendum)
HPI Comments: [name] is a 42 y.o. Female with a pmhx of back pain (L3 and L4) presents to the ED c/o back pain that radiates down her left leg due to moving new furniture. Pt stated she took a available medicine and used a heating pad to alleviate the pain. No other symptoms or complaints were presented at this time.       Patient is a 42 y.o. female presenting with back pain. The history is provided by the patient.   Back Pain          Past Medical History   Diagnosis Date   ??? Adult ADHD    ??? Hypertension    ??? Anxiety    ??? Musculoskeletal disorder         Past Surgical History   Procedure Laterality Date   ??? Hx orthopaedic           No family history on file.     History     Social History   ??? Marital Status: MARRIED     Spouse Name: N/A     Number of Children: N/A   ??? Years of Education: N/A     Occupational History   ??? Not on file.     Social History Main Topics   ??? Smoking status: Never Smoker    ??? Smokeless tobacco: Not on file   ??? Alcohol Use: Yes   ??? Drug Use: No   ??? Sexually Active: Not on file     Other Topics Concern   ??? Not on file     Social History Narrative   ??? No narrative on file                  ALLERGIES: Codeine and Erythromycin      Review of Systems   Constitutional: Negative.    HENT: Negative.    Eyes: Negative.    Respiratory: Negative.    Cardiovascular: Negative.    Gastrointestinal: Negative.    Endocrine: Negative.    Genitourinary: Negative.    Musculoskeletal: Positive for back pain.        Back pain with radiation down the left leg.   Skin: Negative.    Allergic/Immunologic: Negative.    Neurological: Negative.    Hematological: Negative.    Psychiatric/Behavioral: Negative.    All other systems reviewed and are negative.        Filed Vitals:    10/20/12 1621   BP: 139/93   Pulse: 84   Temp: 98 ??F (36.7 ??C)   Resp: 16   Height: 5\' 7"  (1.702 m)   Weight: 90.719 kg (200 lb)   SpO2: 100%            Physical Exam   Nursing note and vitals reviewed.  Constitutional: She is  oriented to person, place, and time. She appears well-developed and well-nourished. No distress.   HENT:   Head: Normocephalic and atraumatic.   Eyes: Conjunctivae and EOM are normal. Pupils are equal, round, and reactive to light. Right eye exhibits no discharge. Left eye exhibits no discharge. No scleral icterus.   Neck: Normal range of motion. Neck supple. No JVD present. No tracheal deviation present. No thyromegaly present.   Cardiovascular: Normal rate, regular rhythm and normal heart sounds.  Exam reveals no gallop.    No murmur heard.  Pulmonary/Chest: Effort normal and breath sounds normal. No stridor. No respiratory distress. She has no wheezes. She has no rales. She  exhibits no tenderness.   Abdominal: Soft. Bowel sounds are normal. She exhibits no distension and no mass. There is no tenderness. There is no rebound and no guarding.   Musculoskeletal: Normal range of motion. She exhibits tenderness. She exhibits no edema.   LUMBAR PARASPINAL TENDERNESS   Lymphadenopathy:     She has no cervical adenopathy.   Neurological: She is alert and oriented to person, place, and time. No cranial nerve deficit. Coordination normal.   Skin: Skin is warm and dry. No rash noted. She is not diaphoretic. No erythema. No pallor.   Psychiatric: She has a normal mood and affect. Her behavior is normal. Judgment and thought content normal.        MDM    Procedures    Medications ordered:   Medications   ketorolac tromethamine (TORADOL) 60 mg/2 mL injection 60 mg (not administered)   oxyCODONE-acetaminophen (PERCOCET) 5-325 mg per tablet 1 Tab (not administered)         Lab findings:  Labs Reviewed - No data to display    EKG interpretation:    X-Ray, CT or other radiology findings or impressions:       Progress notes, Consult notes or additional Procedure notes:     Reevaluation of patient:   I have reevaluated patient. Patient is feeling better    Dispo:  Patient was  in stable condition.  Patient is to return to emergency  department with any new or worsening condition.        Scribe Attestation:   Rosalio Macadamia 10/20/2012 4:31 PM scribing for and in the presence of Dr.Sixto Bowdish A Quint Chestnut, MD     Rosalio Macadamia, Scribe       Provider Attestation:   I personally performed the services described in the documentation, reviewed the documentation, as recorded by the scribe in my presence, and it accurately and completely records my words and actions. October 20, 2012 at 5:04 PM - Harjot Dibello Sheran Luz, MD

## 2012-11-17 NOTE — ED Notes (Signed)
Patient states, "I pulled my back out a wile back, and I started to cut the grass today, and pulled it out again."

## 2012-11-17 NOTE — ED Notes (Signed)
I have reviewed discharge instructions with the patient.  The patient verbalized understanding.

## 2012-11-17 NOTE — ED Provider Notes (Signed)
HPI Comments: [name] is a 42 y.o. Female with PMhx of musculoskeletal disorder who presents to the ED with c/o left lower back pain. Pt states that x1 month ago she pulled her back, but the pain worsened today while cutting the grass. Pt notes her L3-L4 is deteriorating but notes her new pain is muscle-related and not the same as her chronic disc related lumber pain. Pt denies abd pain. No other symptoms or complaints were presented at this time, specifically no bowel or bladder problems, numbness or weakness.         Patient is a 42 y.o. female presenting with back pain. The history is provided by the patient.   Back Pain   The problem has not changed since onset.Pertinent negatives include no chest pain, no numbness, no abdominal pain and no weakness.        Past Medical History   Diagnosis Date   ??? Adult ADHD    ??? Hypertension    ??? Anxiety    ??? Musculoskeletal disorder         Past Surgical History   Procedure Laterality Date   ??? Hx orthopaedic           No family history on file.     History     Social History   ??? Marital Status: MARRIED     Spouse Name: N/A     Number of Children: N/A   ??? Years of Education: N/A     Occupational History   ??? Not on file.     Social History Main Topics   ??? Smoking status: Never Smoker    ??? Smokeless tobacco: Not on file   ??? Alcohol Use: Yes   ??? Drug Use: No   ??? Sexually Active: Not on file     Other Topics Concern   ??? Not on file     Social History Narrative   ??? No narrative on file                  ALLERGIES: Codeine and Erythromycin      Review of Systems   Constitutional: Negative.  Negative for fatigue.   HENT: Negative.  Negative for neck pain.    Eyes: Negative.    Respiratory: Negative.  Negative for cough and shortness of breath.    Cardiovascular: Negative.  Negative for chest pain.   Gastrointestinal: Negative.  Negative for nausea, vomiting, abdominal pain and diarrhea.   Genitourinary: Negative.  Negative for difficulty urinating.   Musculoskeletal: Positive  for back pain.   Skin: Negative.    Neurological: Negative.  Negative for dizziness, weakness and numbness.   Psychiatric/Behavioral: Negative.    All other systems reviewed and are negative.        Filed Vitals:    11/17/12 1226   BP: 127/91   Pulse: 89   Temp: 99.3 ??F (37.4 ??C)   Resp: 16   Height: 5\' 7"  (1.702 m)   Weight: 90.719 kg (200 lb)   SpO2: 99%            Physical Exam   Nursing note and vitals reviewed.  Constitutional: She is oriented to person, place, and time. She appears well-developed and well-nourished. No distress.   HENT:   Head: Normocephalic and atraumatic.   Right Ear: External ear normal.   Left Ear: External ear normal.   Nose: Nose normal.   Mouth/Throat: Oropharynx is clear and moist.   Eyes: Conjunctivae and EOM  are normal. Pupils are equal, round, and reactive to light. No scleral icterus.   Neck: Normal range of motion. Neck supple.   Cardiovascular: Normal rate, regular rhythm, normal heart sounds and intact distal pulses.    Pulmonary/Chest: Effort normal and breath sounds normal. She exhibits no tenderness.   Abdominal: Soft. Bowel sounds are normal. She exhibits no distension. There is no tenderness. There is no rebound and no guarding.   Musculoskeletal: She exhibits no edema and no tenderness.   Lower back pain reproduced during straight leg raise.    Neurological: She is alert and oriented to person, place, and time. She has normal reflexes. She displays normal reflexes. No cranial nerve deficit. She exhibits normal muscle tone. Coordination normal.   No sensory loss, Gait normal, Motor 5/5   Skin: Skin is warm and dry.   Psychiatric: Her behavior is normal.        MDM     Differential Diagnosis; Clinical Impression; Plan:     42 year old female presents with complaint of back pain related to pulling her lawn mower this morning. No concerning issues with her history or exam. No studies seem indicated    Ddx: back pain, muscle strain, lumbar strain, (doubt sciatica)       Procedures    I have reassessed the patient.  Patient is feeling better.  Patient will be prescribed Perocet, Robaxin, and Anaprox DS.  Patient was discharge in stable condition.  Patient is to return to emergency department with any new or worsening condition.  12:56 PM        Scribe Attestation:   Sigmund Hazel 11/17/2012 12:52 PM scribing for and in the presence of Dr.Elisabella Hacker A Earlene Plater, MD     [place], Scribe       Provider Attestation:   I personally performed the services described in the documentation, reviewed the documentation, as recorded by the scribe in my presence, and it accurately and completely records my words and actions. 1:30 PM 11/17/2012 - Rhona Leavens, MD

## 2012-11-19 NOTE — ED Provider Notes (Signed)
HPI Comments: [name] is a 42 y.o. Female who presents to the ED with c/o back pain. Pt states he was in the ED x2 days ago for pulling back muscles, was evaluated, given medication, and discharged. Pt notes she is running short of her pain medication and cannot see her PCP for the next x4 days. She still denies urinary and bowel problems. Pt notes her pain is at 6-7/10 on the pain scale currently. She denies doing any activities this weekend that could exacerbate her pain and denies any recent back trauma since x2 days ago. No other symptoms or complaints were presented at this time.         The history is provided by the patient.        Past Medical History   Diagnosis Date   ??? Adult ADHD    ??? Hypertension    ??? Anxiety    ??? Musculoskeletal disorder         Past Surgical History   Procedure Laterality Date   ??? Hx orthopaedic           No family history on file.     History     Social History   ??? Marital Status: MARRIED     Spouse Name: N/A     Number of Children: N/A   ??? Years of Education: N/A     Occupational History   ??? Not on file.     Social History Main Topics   ??? Smoking status: Never Smoker    ??? Smokeless tobacco: Not on file   ??? Alcohol Use: Yes   ??? Drug Use: No   ??? Sexually Active: Not on file     Other Topics Concern   ??? Not on file     Social History Narrative   ??? No narrative on file                  ALLERGIES: Codeine and Erythromycin      Review of Systems   Constitutional: Negative.    HENT: Negative.  Negative for neck pain.    Eyes: Negative.    Respiratory: Negative.    Cardiovascular: Negative.    Gastrointestinal: Negative.  Negative for abdominal pain.   Genitourinary: Negative.  Negative for difficulty urinating.   Musculoskeletal: Positive for back pain.   Skin: Negative.    Neurological: Negative.  Negative for weakness and numbness.   Psychiatric/Behavioral: Negative.    All other systems reviewed and are negative.        There were no vitals filed for this visit.         Physical  Exam   Vitals reviewed.  Constitutional: She is oriented to person, place, and time. She appears well-developed and well-nourished.   HENT:   Head: Normocephalic and atraumatic.   Eyes: Conjunctivae are normal. No scleral icterus.   Neck: Normal range of motion.   Cardiovascular: Normal rate and regular rhythm.    Pulmonary/Chest: Effort normal and breath sounds normal.   Abdominal: Soft. There is no tenderness.   Musculoskeletal: Normal range of motion. She exhibits tenderness.   Soft tissue tenderness over left low back. No midline boney tenderness. Gait is slow and steady.    Neurological: She is alert and oriented to person, place, and time.   Skin: Skin is warm and dry.   Psychiatric: She has a normal mood and affect. Her behavior is normal. Judgment normal.  MDM     Differential Diagnosis; Clinical Impression; Plan:     Patient with history of back issues returns to ED with continuing pain after injury Friday. She notes she has "only 3 pain pills left" from Rx written 2 days ago. She has an appointment with her PCP Wednesday. No new or concerning symptoms by history. No imaging seems indicated. Will give additional narcotic to cover 2 more days until follow up.    Ddx: back pain, back strain, lumbar strain, medication refill      Procedures    I have reassessed the patient.  Patient is feeling better.  Patient will be prescribed Norco.  Patient was discharge in stable condition.  Patient is to return to emergency department with any new or worsening condition.  10:13 AM        Scribe Attestation:   Sigmund Hazel 11/19/2012 10:09 AM scribing for and in the presence of Dr.Keigan Tafoya A Suzan Garibaldi, Acupuncturist:   I personally performed the services described in the documentation, reviewed the documentation, as recorded by the scribe in my presence, and it accurately and completely records my words and actions. 10:27 AM 11/19/2012 - Rhona Leavens, MD

## 2012-11-19 NOTE — ED Notes (Signed)
Patient discharged alert and oriented x 3, denies chest pain or SOB at this time.  Electronic signature pad  unavailable, patient agreed to discharge plan.  I have reviewed discharge instructions with the patient.  The patient verbalized understanding.

## 2012-11-19 NOTE — ED Notes (Addendum)
Pt. States she had lower back pain, was seen here for same problem  Friday,states she's running out  Of pain meds, holiday weekend,  Has appointment  With ortho  This coming thursday

## 2012-12-12 MED ADMIN — hydrocodone-acetaminophen (NORCO) 5-325 mg per tablet 1 tablet: ORAL | @ 17:00:00 | NDC 68084036811

## 2012-12-12 NOTE — ED Provider Notes (Signed)
[place]  [facility] EMERGENCY DEPT      42 y.o. female with noted past medical history who presents to the emergency department c/o right upper toothache for the past 2 days, worse since last night.  States she knows the tooth broke.  Doesn't have a Education officer, community or insurance.  She has been taking Naprosyn without relief.  Also having runny nose, facial pain, right ear pain. Denies any fever, chills, difficulty breathing/swallowing or any other symptoms at this time.     No other complaints.     Current Facility-Administered Medications   Medication Dose Route Frequency   ??? hydrocodone-acetaminophen (NORCO) 5-325 mg per tablet 1 tablet  1 tablet Oral NOW     Current Outpatient Prescriptions   Medication Sig   ??? penicillin v potassium (VEETID) 500 mg tablet Take 1 tablet by mouth four (4) times daily for 7 days.   ??? hydrocodone-acetaminophen (NORCO) 5-325 mg per tablet Take 1-2 tablets PO every 4-6 hours as needed for pain control.  If over the counter ibuprofen or acetaminophen was suggested, then only take the vicodin for pain not well controlled with the over the counter medication.   ??? hydrocodone-acetaminophen (NORCO) 7.5-325 mg per tablet Take 1 tablet by mouth every six (6) hours as needed for Pain.   ??? oxycodone-acetaminophen (PERCOCET) 5-325 mg per tablet Take 1 tablet by mouth every six (6) hours as needed for Pain for up to 12 doses.   ??? oxyCODONE-acetaminophen (PERCOCET) 5-325 mg per tablet Take 1 Tab by mouth every four (4) hours as needed for Pain.   ??? cyclobenzaprine (FLEXERIL) 10 mg tablet Take 1 Tab by mouth three (3) times daily as needed for Muscle Spasm(s).   ??? oxyCODONE-acetaminophen (PERCOCET) 5-325 mg per tablet Take 1 Tab by mouth every four (4) hours as needed for Pain.   ??? albuterol (PROVENTIL HFA, VENTOLIN HFA) 90 mcg/actuation inhaler Take 2 Puffs by inhalation every four (4) hours as needed for Wheezing or Shortness of Breath (Cough).   ??? inhalational spacing device 1 Each by Does Not Apply route  as needed (USE WITH INHALER).   ??? chlorhexidine (PERIDEX) 0.12 % solution 15 mL by Swish and Spit route two (2) times a day.   ??? oxyCODONE-acetaminophen (PERCOCET) 5-325 mg per tablet Take 1 Tab by mouth every four (4) hours as needed for Pain.   ??? propranolol LA (INDERAL LA) 80 mg SR capsule Take 80 mg by mouth daily.   ??? ALPRAZolam (XANAX) 1 mg tablet Take 1 mg by mouth four (4) times daily.   ??? dextroamphetamine-amphetamine (ADDERALL) 30 mg tablet Take 30 mg by mouth two (2) times a day.       Past Medical History   Diagnosis Date   ??? Adult ADHD    ??? Hypertension    ??? Anxiety    ??? Musculoskeletal disorder        Past Surgical History   Procedure Laterality Date   ??? Hx orthopaedic         History reviewed. No pertinent family history.    History     Social History   ??? Marital Status: MARRIED     Spouse Name: N/A     Number of Children: N/A   ??? Years of Education: N/A     Occupational History   ??? Not on file.     Social History Main Topics   ??? Smoking status: Never Smoker    ??? Smokeless tobacco: Not on file   ???  Alcohol Use: Yes   ??? Drug Use: No   ??? Sexually Active: Not on file     Other Topics Concern   ??? Not on file     Social History Narrative   ??? No narrative on file       Allergies   Allergen Reactions   ??? Codeine Unknown (comments)   ??? Erythromycin Unknown (comments)       Patient's primary care provider (as noted in EPIC):  GARFIELD Kennedy Bucker, MD    REVIEW OF SYSTEMS:    Constitutional:  Negative for fever, diaphoresis.  HENT:  + right upper toothache, rhinorrhea, nasal congestion. Negative for congestion.    Respiratory:  Negative for cough and shortness of breath.    Cardiovascular:  Negative for chest pain and palpitations.   Skin:  Negative for pallor.   Neurological:  Negative for weakness.     Visit Vitals   Item Reading   ??? BP 152/94   ??? Pulse 94   ??? Temp 97.8 ??F (36.6 ??C)   ??? Resp 16   ??? Wt 90.719 kg (200 lb)   ??? BMI 32.3 kg/m2   ??? SpO2 100%       PHYSICAL EXAM:    CONSTITUTIONAL:  Alert, in no  apparent distress;  well developed;  well nourished.  HEAD:  Normocephalic, atraumatic.  EYES:  PERRL. EOMI.  Non-icteric sclera.  Normal conjunctiva.  ENTM:  Nose:  no rhinorrhea, nasal turbinates erythematous.  Throat:  no erythema or exudate, mucous membranes moist.  NECK:  Supple  RESPIRATORY:  Chest clear, equal breath sounds, good air movement.  CARDIOVASCULAR:  Regular rate and rhythm.  No murmurs, rubs, or gallops.  NEURO:  Moves all four extremities, and grossly normal motor exam.  SKIN:  No rashes;  Normal for age.  PSYCH:  Alert and normal affect.    Oropharynx/ teeth:  Tooth # 1 is broken down to gingival line, with focal tenderness to percussion.  There is no fluctuance or abscess, mild gingival erythema/edema.  Oropharynx is otherwise normal and symmetric, uvula midline.       IMPRESSION AND MEDICAL DECISION MAKING:  Based upon the patient???s presentation with noted HPI and PE, along with the work up done in the emergency department, I believe that the patient is having a toothache.  Will rx penicillin and have pt f/u with     Diagnosis:   1. Pain, dental    2. Acute upper respiratory infection      Disposition: Home    Follow-up Information    Follow up With Details Comments Contact Info    [facility] In 2 days  [address] [facility]  [place] [address]  [phone]    [facility] EMERGENCY DEPT  If symptoms worsen [address]  [place] [address]  [phone]          Current Discharge Medication List      START taking these medications    Details   penicillin v potassium (VEETID) 500 mg tablet Take 1 tablet by mouth four (4) times daily for 7 days.  Qty: 28 tablet, Refills: 0      hydrocodone-acetaminophen (NORCO) 5-325 mg per tablet Take 1-2 tablets PO every 4-6 hours as needed for pain control.  If over the counter ibuprofen or acetaminophen was suggested, then only take the vicodin for pain not well controlled with the over the counter medication.  Qty: 12 tablet, Refills: 0  CONTINUE  these medications which have NOT CHANGED    Details   hydrocodone-acetaminophen (NORCO) 7.5-325 mg per tablet Take 1 tablet by mouth every six (6) hours as needed for Pain.  Qty: 12 tablet, Refills: 0      !! oxycodone-acetaminophen (PERCOCET) 5-325 mg per tablet Take 1 tablet by mouth every six (6) hours as needed for Pain for up to 12 doses.  Qty: 12 tablet, Refills: 0      !! oxyCODONE-acetaminophen (PERCOCET) 5-325 mg per tablet Take 1 Tab by mouth every four (4) hours as needed for Pain.  Qty: 20 Tab, Refills: 0      cyclobenzaprine (FLEXERIL) 10 mg tablet Take 1 Tab by mouth three (3) times daily as needed for Muscle Spasm(s).  Qty: 15 Tab, Refills: 0      !! oxyCODONE-acetaminophen (PERCOCET) 5-325 mg per tablet Take 1 Tab by mouth every four (4) hours as needed for Pain.  Qty: 12 Tab, Refills: 0      albuterol (PROVENTIL HFA, VENTOLIN HFA) 90 mcg/actuation inhaler Take 2 Puffs by inhalation every four (4) hours as needed for Wheezing or Shortness of Breath (Cough).  Qty: 1 Inhaler, Refills: 0      inhalational spacing device 1 Each by Does Not Apply route as needed (USE WITH INHALER).  Qty: 1 Device, Refills: 0      chlorhexidine (PERIDEX) 0.12 % solution 15 mL by Swish and Spit route two (2) times a day.      !! oxyCODONE-acetaminophen (PERCOCET) 5-325 mg per tablet Take 1 Tab by mouth every four (4) hours as needed for Pain.      propranolol LA (INDERAL LA) 80 mg SR capsule Take 80 mg by mouth daily.      ALPRAZolam (XANAX) 1 mg tablet Take 1 mg by mouth four (4) times daily.      dextroamphetamine-amphetamine (ADDERALL) 30 mg tablet Take 30 mg by mouth two (2) times a day.       !! - Potential duplicate medications found. Please discuss with provider.        Nicolette Bang, PA

## 2012-12-12 NOTE — ED Notes (Signed)
Received Life Coach Consult to assist PT with Follow-up , Patient resides in the city of [place], PT will follow-up at:  Appointment: Screenings Monday ??? Friday 2:00 PM to 4:00 PM    [facility]   [address]  [place] [address]  [phone]  Please bring:    ??? Photo identification  ??? Insurance Card  ??? Proof of Income / most recent paystub to assist with establishing co-pay if unemployed, please provide copy of W6 from any local [employer] [employer]   ??? List of all medication you are currently taking  ??? Emergency Department Discharge Papers

## 2012-12-12 NOTE — ED Provider Notes (Signed)
I was personally available for consultation in the emergency department. I have reviewed the chart prior to the patient's discharge and agree with the documentation recorded by the MLP, including the assessment, treatment plan, and disposition.

## 2012-12-12 NOTE — ED Notes (Signed)
Broken tooth, would like life coach help with dental clinic

## 2012-12-12 NOTE — ED Notes (Signed)
I have reviewed discharge instructions with the patient.  The patient verbalized understanding.  Patient armband removed and shredded, scripts x 2 given

## 2013-01-06 NOTE — ED Notes (Signed)
Patient armband removed and shredded I have reviewed discharge instructions with the patient.  The patient verbalized understanding. Patient is ambulatory with NAD noted and VSS.  Patient is being discharged with two prescriptions at this time.

## 2013-01-06 NOTE — ED Notes (Signed)
Patient states that she has been having dental pain for the past two days.  Patient has been seen here for the same compliant in the past two weeks.  Upon inspection patient noted to have decayed teeth in the upper right side of jaw.

## 2013-01-06 NOTE — ED Notes (Signed)
Pt. States she had toothache  Right upper jaw  For 2 days,  Was seen here over 2 weeks ago for same problem

## 2013-01-06 NOTE — ED Provider Notes (Signed)
HPI Comments: [name]. Gomes is a 42 year old female with a PMHx Adult ADHD, HTN, and Anxiety presents to ER c/o dental pain and sensitivity x2 weeks.  She reports completed her ABX as directed and took her last dose of Percocet two days ago.  She verbalizes continues to have constant aching dental pain and cold beverages worsen the discomfort.  She reports has been tolerating po warm fluids and soft foods.  Denies fever, chills, headache, facial swelling, Cp, SOB, abdominal pain, N/V/D, or any other concerns.     Patient is a 42 y.o. female presenting with dental problem. The history is provided by the patient.   Dental Pain            Past Medical History   Diagnosis Date   ??? Adult ADHD    ??? Hypertension    ??? Anxiety    ??? Musculoskeletal disorder         Past Surgical History   Procedure Laterality Date   ??? Hx orthopaedic           History reviewed. No pertinent family history.     History     Social History   ??? Marital Status: MARRIED     Spouse Name: N/A     Number of Children: N/A   ??? Years of Education: N/A     Occupational History   ??? Not on file.     Social History Main Topics   ??? Smoking status: Never Smoker    ??? Smokeless tobacco: Not on file   ??? Alcohol Use: Yes   ??? Drug Use: No   ??? Sexually Active: Not on file     Other Topics Concern   ??? Not on file     Social History Narrative   ??? No narrative on file                  ALLERGIES: Codeine and Erythromycin      Review of Systems   Constitutional: Negative.    HENT: Positive for dental problem.    Eyes: Negative.    Respiratory: Negative.    Cardiovascular: Negative.    Gastrointestinal: Negative.    Genitourinary: Negative.    Musculoskeletal: Negative.    Skin: Negative.    Neurological: Negative.        Filed Vitals:    01/06/13 1019 01/06/13 1022 01/06/13 1024   BP: 153/107     Pulse: 116  82   Temp: 97.8 ??F (36.6 ??C)     Resp: 16  14   Height: 5\' 6"  (1.676 m)     Weight: 90.719 kg (200 lb)     SpO2: 99% 99%             Physical Exam   Nursing note  and vitals reviewed.  Constitutional: She is oriented to person, place, and time. She appears well-developed and well-nourished. No distress.   HENT:   Head: No trismus in the jaw.   Right Ear: Hearing, tympanic membrane, external ear and ear canal normal.   Left Ear: Hearing, tympanic membrane, external ear and ear canal normal.   Nose: Nose normal. Right sinus exhibits no maxillary sinus tenderness and no frontal sinus tenderness. Left sinus exhibits no maxillary sinus tenderness and no frontal sinus tenderness.   Mouth/Throat: Oropharynx is clear and moist and mucous membranes are normal. No oral lesions. Dental caries (multiple dental caries) present. No dental abscesses or edematous.   Cardiovascular: Normal rate,  regular rhythm and normal heart sounds.  Exam reveals no gallop and no friction rub.    No murmur heard.  Pulmonary/Chest: Effort normal and breath sounds normal. No respiratory distress. She has no wheezes. She has no rales. She exhibits no tenderness.   Musculoskeletal: Normal range of motion.   Neurological: She is alert and oriented to person, place, and time.   Skin: Skin is warm and dry. She is not diaphoretic.   Psychiatric: She has a normal mood and affect. Her behavior is normal. Judgment and thought content normal.        MDM     Differential Diagnosis; Clinical Impression; Plan:     Periapical abscess, Trigeminal neuralgia, Masticator space infection, Ludwig's angina, Retropharyngeal space infection, Infection after root canal, Dental caries, Odontalgia, Dry Socket, Acute Necrotizing Ulcerative Gingivitis (ANUG)    Clinical Impression/plan:  Patient stable condition. Will refer to dentist.  Will prescribe percocet (12) pills and Peridex.  No ABX required.  Follow up with PCP in 2-4 days.  Patient verbalizes discharge instructions.   Amount and/or Complexity of Data Reviewed:    Review and summarize past medical records:  Yes  Risk of Significant Complications, Morbidity, and/or Mortality:    Presenting problems:  Low  Diagnostic procedures:  Low  Management options:  Low      Procedures

## 2013-01-06 NOTE — ED Provider Notes (Signed)
I was personally available for consultation in the emergency department. I have reviewed the chart prior to the patient's discharge and agree with the documentation recorded by the MLP, including the assessment, treatment plan, and disposition.

## 2013-01-09 NOTE — ED Provider Notes (Signed)
[facility]  EMERGENCY DEPARTMENT TREATMENT REPORT  NAME:  [name]  SEX:   F  ADMIT: 01/09/2013  DOB:   1970/03/29  MR#    [id number]  ROOM:    TIME DICTATED: 04 10 PM  ACCT#  [id number]    cc: Elise Benne M.D.    TIME OF EVALUATION:  1600.    PRIMARY CARE DOCTOR:  Dr. Dione Housekeeper    CHIEF COMPLAINT:  Migraine and dental pain.    HISTORY OF PRESENT ILLNESS:  This is a 42 year old female who states a couple days ago, she was chewing   some hard candy and broke a tooth off.  It is the most posterior molar that   she has left remaining in the upper side of her mouth on the right.  She also   stated that she thinks that this might have triggered the migraine that she   had when she woke up this morning and it has been getting progressively worse.    She does have a history of migraines.  This feels exactly the same as   migraines she has had in the past.  It was gradual onset and now it is a sharp   throbbing pain, 10 out of 10, with sensitivity to light and some nausea but   no vomiting.  The patient also denies any fever, abdominal pain, shortness of   breath, chest pain or diarrhea.  The tooth pain she describes as a sharp pain,   hurts less than the migraine headache, about a 7 out of 10.  She has been   trying to get in to see a dentist to have the tooth taken care of.     REVIEW OF SYSTEMS:   CONSTITUTIONAL:  No fever, chills, or weight loss.   RESPIRATORY:  No cough, shortness of breath, or wheezing.   CARDIOVASCULAR:  No chest pain, chest pressure, or palpitations.   NEUROLOGICAL:  Positive for headache.  ENT:  Positive for pain in the teeth right upper side.    PAST MEDICAL HISTORY:  Migraines. No pertinent surgical history.    FAMILY HISTORY:  Noncontributory in this case.    SOCIAL HISTORY:  The patient does not smoke, drink alcohol or use illicit drugs.    ALLERGIES:  CODEINE, ERYTHROMYCIN.    CURRENT MEDICATIONS:  Klonopin.    PHYSICAL EXAMINATION:   VITAL SIGNS:  Blood pressure is 141/106, pulse 122, respirations 18,   temperature 98.1, satting at 98% on room air.  GENERAL APPEARANCE:  Patient appears well developed and well nourished.    Appearance and behavior are age and situation appropriate.   ENT:  She does have a tooth that looks like it is broken in half, on the upper   right side of her mouth on the most posterior molar that she has remaining   after having ones previously removed.  There is no abscess, no erythema.  The   rest of her teeth and gums  are unremarkable.    RESPIRATORY:  Clear and equal breath sounds.  No respiratory distress,   tachypnea, or accessory muscle use.     CARDIOVASCULAR:   Heart regular, without murmurs, gallops, rubs, or thrills.       MUSCULOSKELETAL:  Nails:  No clubbing or deformities.  Nailbeds pink with   prompt capillary refill.    NEUROLOGIC:  The patient alert and oriented times 3.  Sensation intact.  Motor   strength is equal and symmetric.  Cranial nerves  II-XII grossly intact.  She   has 2+ DTRs intact bilaterally upper and lower.  There is no facial asymmetry   or dysarthria.  There is no ataxia.  She has appropriate finger-to-nose.    Extraocular movements are intact and normal.  She has normal heel-to-shin.    CONTINUATION BY Fransisca Kaufmann, PA-C    IMPRESSION AND PLAN:   This is a 42 year old female who presents for evaluation of a migraine   headache and dental pain in the upper right side after breaking a tooth on a   piece of hard candy a few days ago.      This is a new problem for this patient.  Old records were reviewed.  No   additional relevant information was obtained.  Nursing notes were reviewed.     PLAN:   The plan for this patient will be do provide her with some IV diphenhydramine   and Reglan for the migraine headache as well as a peripheral nerve block for   her dental pain in the upper side on the right.  The differentials of course    would include intracranial hemorrhage versus migraine headache versus other   intracranial process; however, the patient does have a history of migraine   headaches and says is exactly the same as previous migraines she had with a   gradual-onset, pounding-type of pain with photophobia.  So we are now   obtaining a CT at this time.      COURSE IN EMERGENCY DEPARTMENT:   The patient was given IV diphenhydramine and Reglan for the migraine headache   approximately 45 minutes after which, she stated her head pain was much   better.  Also completed a posterior/superior alveolar nerve block as well as a   middle superior alveolar nerve block for the dental pain she was   experiencing.  Almost immediately after those nerve blocks were instilled, the   patient stated she was having no more pain in her tooth.    DIAGNOSES:   1.  Migraine headache.  2.  Dental fracture.   3.  Dental pain.     DISPOSITION:  The patient remained completely stable during her course in the Emergency   Department.  She was discharged home in stable condition.  Advised to return   to the Emergency Department if her condition worsens or any new symptoms   develop.  She was given a prescription for Norco as well as Pen-Vee-K for   empiric treatment of a possible infection tooth that she had fractured.  I   also gave her the name of the on-call dentist for definitive treatment of that   tooth.  The patient was agreeable with the plan.  The patient was seen and   evaluated by myself and Dr. Henrene Hawking, who agrees with the above assessment and   plan.      ___________________  Konrad Felix MD  Dictated By: Fransisca Kaufmann, PA-C    My signature above authenticates this document and my orders, the final  diagnosis (es), discharge prescription (s), and instructions in the [facility]   Pul[facility]heck record.  Nursing notes have been reviewed by the physician/mid-level provider.    If you have any questions please contact [phone].    AK  D:01/09/2013 16:10:51   T: 01/09/2013 19:30:58  [id number]  Authenticated by Janett Billow. Henrene Hawking, M.D. On 01/22/2013 04:29:01 PM

## 2013-01-13 MED ADMIN — ondansetron (ZOFRAN ODT) tablet 8 mg: ORAL | @ 18:00:00 | NDC 68001024716

## 2013-01-13 MED ADMIN — hydromorphone (PF) (DILAUDID) injection 1 mg: INTRAMUSCULAR | @ 18:00:00 | NDC 00409128331

## 2013-01-13 NOTE — ED Notes (Signed)
Pt. States   She step on a pothole & fell on her left side c/o pain left elbow

## 2013-01-13 NOTE — ED Notes (Signed)
Pt c/o left elbow pain after she tripped over a pothole and fell on her left elbow. Patient is screaming in pain and states her fingers are numb and tingling. There is no swelling or deformity noted at this time

## 2013-01-13 NOTE — ED Provider Notes (Addendum)
HPI Comments: [name] is a 42 y.o. female ambulatory to ED with spouse c/o left elbow pain that started PTA. Pt. States   She step on a pothole & fell on her left side, experienced sharp pain but no pop, now the pain is radiating down to fingers 4 and 5, feels tingling. Also feels pain at the base of right thump up to wrist, not sure if she hit it or overstretched it. She did not have any other trauma, did not hit her head or neck, no LOC. No meds taken PTA. husband drove to ED. The pt had hysterectomy.     Patient is a 42 y.o. female presenting with fall and elbow pain. The history is provided by the patient and the spouse.   Fall  Pertinent negatives include no numbness, no abdominal pain, no nausea, no vomiting and no headaches.   Elbow Pain   Pertinent negatives include no numbness, no back pain and no neck pain.        Past Medical History   Diagnosis Date   ??? Adult ADHD    ??? Hypertension    ??? Anxiety    ??? Musculoskeletal disorder         Past Surgical History   Procedure Laterality Date   ??? Hx orthopaedic           History reviewed. No pertinent family history.     History     Social History   ??? Marital Status: MARRIED     Spouse Name: N/A     Number of Children: N/A   ??? Years of Education: N/A     Occupational History   ??? Not on file.     Social History Main Topics   ??? Smoking status: Never Smoker    ??? Smokeless tobacco: Not on file   ??? Alcohol Use: Yes   ??? Drug Use: No   ??? Sexually Active: Not on file     Other Topics Concern   ??? Not on file     Social History Narrative   ??? No narrative on file                  ALLERGIES: Codeine and Erythromycin      Review of Systems   Constitutional: Negative.    HENT: Negative for neck pain.    Respiratory: Negative.    Cardiovascular: Negative.    Gastrointestinal: Negative.  Negative for nausea, vomiting and abdominal pain.   Genitourinary: Negative.    Musculoskeletal: Positive for myalgias and arthralgias. Negative for back pain, joint swelling and gait  problem.        Left UE per HPI   Skin: Negative for color change and wound.   Neurological: Negative for dizziness, weakness, numbness and headaches.   Hematological: Does not bruise/bleed easily.   All other systems reviewed and are negative.        Filed Vitals:    01/13/13 1318   BP: 140/92   Pulse: 101   Temp: 97.9 ??F (36.6 ??C)   Resp: 18   Height: 5\' 7"  (1.702 m)   Weight: 90.719 kg (200 lb)   SpO2: 97%            Physical Exam   Nursing note and vitals reviewed.  Constitutional: She is oriented to person, place, and time. She appears well-developed and well-nourished.  Non-toxic appearance. No distress.   HENT:   Head: Normocephalic and atraumatic.   Nose: Nose normal.  Mouth/Throat: Uvula is midline and mucous membranes are normal.   Eyes: Conjunctivae and EOM are normal. Pupils are equal, round, and reactive to light.   Neck: Normal range of motion. Neck supple.   Cardiovascular: Normal rate, regular rhythm, normal heart sounds and intact distal pulses.    Pulmonary/Chest: Effort normal and breath sounds normal. She exhibits no tenderness.   Abdominal: Soft. There is no tenderness.   Musculoskeletal: She exhibits tenderness. She exhibits no edema.        Right shoulder: Normal.        Left shoulder: Normal.        Right elbow: Normal.       Left elbow: She exhibits decreased range of motion (2/2 pain, pt refuses to move it, strength is preserved and the pt can demonstrate supination and pronation of hand). She exhibits no swelling, no effusion, no deformity and no laceration. Tenderness found. Radial head and lateral epicondyle tenderness noted. No medial epicondyle and no olecranon process tenderness noted.        Right wrist: Normal.        Left wrist: She exhibits tenderness (minimal pain elicited by Lourena Simmonds, min pain in snuff box). She exhibits normal range of motion, no bony tenderness, no swelling, no effusion, no crepitus, no deformity and no laceration.        Left upper arm: She exhibits  tenderness (referred pain from elbow).        Left forearm: She exhibits tenderness (referred from elbow). She exhibits no swelling, no edema, no deformity and no laceration.        Right hand: Normal.        Left hand: She exhibits tenderness (pt reports tingling in digits 4,5, no pain elicited by palpation). She exhibits normal range of motion, normal capillary refill, no deformity and no swelling. Normal sensation noted. Decreased strength (poor effort 2/2 pain) noted.   Neurological: She is alert and oriented to person, place, and time. She has normal reflexes. No cranial nerve deficit. Coordination normal.   Skin: Skin is warm and dry. She is not diaphoretic.   Psychiatric: She has a normal mood and affect.        MDM     Amount and/or Complexity of Data Reviewed:   Tests in the radiology section of CPT??:  Ordered and reviewed  Risk of Significant Complications, Morbidity, and/or Mortality:   Presenting problems:  Moderate  Diagnostic procedures:  Low  Management options:  Low  Progress:   Patient progress:  Stable      Procedures      ACE wrap and Sling Check Note    Patient: [name]  MRN: [id number]  Date: 01/13/2013  Age:  41 y.o.,      Sex: female    Date of Birth:  10-12-1970      Location: left UE    Applied by Natalia neurovascular intact prior to splint placement neurovascular intact after splint placement ACE wrap and sling are placed in good position.     Rozanna Box, PA January 13, 2013 2:41 PM     Diagnosis:   1. Elbow sprain, left, initial encounter    2. Elevated blood pressure          Disposition: discharged home with instructions     Rest, ice and elevate affected extremity.    Use sling for elevation and comfort.   Remove sling before bedtime.    Take Tylenol/Acetaminophen (every 4-6 hours) and/or Motrin/Ibuprofen/Advil (  every 6-8 hours) or Naprosyn/Naproxyn/Aleve for fever or pain as needed.  Take Percocet for breakthrough pain and note that it contains Tylenol/Acetaminophen and may  cause drowsiness. TAKE APPROPRIATE PRECAUTIONS. DO NOT DRIVE OR OPERATE HEAVY MACHINERY WHILE USING.   Follow up with your doctor or the provided referral for further evaluation and management  Return to emergency room for worsening or new symptoms.       Follow-up Information    Follow up With Details Comments Contact Info    Lawanda Cousins, MD  As needed [address] [facility]  [place] [address]  [phone]      [facility] [address] In 2 weeks if no better with supportive care [address]  [place] [address]  [phone]    [facility] EMERGENCY DEPT  As needed if symptoms worsen [address]  [place] [address]  [phone]          Current Discharge Medication List      START taking these medications    Details   ibuprofen (MOTRIN) 800 mg tablet Take 1 tablet by mouth every six (6) hours as needed for Pain for up to 7 days.  Qty: 20 tablet, Refills: 0         CONTINUE these medications which have CHANGED    Details   oxycodone-acetaminophen (PERCOCET) 5-325 mg per tablet Take 1 tablet by mouth every four (4) hours as needed for Pain for up to 6 doses.  Qty: 6 tablet, Refills: 0         CONTINUE these medications which have NOT CHANGED    Details   chlorhexidine (PERIDEX) 0.12 % solution 15 mL by Swish and Spit route two (2) times a day for 14 days.  Qty: 420 mL, Refills: 0      albuterol (PROVENTIL HFA, VENTOLIN HFA) 90 mcg/actuation inhaler Take 2 Puffs by inhalation every four (4) hours as needed for Wheezing or Shortness of Breath (Cough).  Qty: 1 Inhaler, Refills: 0      inhalational spacing device 1 Each by Does Not Apply route as needed (USE WITH INHALER).  Qty: 1 Device, Refills: 0      propranolol LA (INDERAL LA) 80 mg SR capsule Take 80 mg by mouth daily.      ALPRAZolam (XANAX) 1 mg tablet Take 1 mg by mouth four (4) times daily.      dextroamphetamine-amphetamine (ADDERALL) 30 mg tablet Take 30 mg by mouth two (2) times  a day.

## 2013-01-13 NOTE — ED Notes (Signed)
I have reviewed discharge instructions with the patient.  The patient verbalized understanding.  Patient armband removed and shredded.  Pt is ambulatory with NAD noted and VSS. Patient is being discharged with 2 prescription at this time.

## 2013-01-30 NOTE — ED Provider Notes (Signed)
[facility]  EMERGENCY DEPARTMENT TREATMENT REPORT  NAME:  Boies, Nadie  SEX:   F  ADMIT: 01/30/2013  DOB:   [birth year]  MR#    [id number]  ROOM:    TIME DICTATED: 06 03 PM  ACCT#  [id number]        PRIMARY CARE PHYSICIAN:  None.    DENTIST:  None.     TIME OF EVALUATION:  1638.    CHIEF COMPLAINT:  Toothache and throat pain.    HISTORY OF PRESENT ILLNESS:  This is a 42 year old female who presents with 2 complaints.  The first is   that she has had dental pain in the right superior molar for the past month.    She states she broke it.  She was given penicillin initially and had a dental   block performed.  She states that she has her name on t he  list for the free   dental clinic but has not been able to be seen yet.  Her pain is an 8 out of   10 that radiates to her ear.  She denies any fevers.      The patient states she has also been sick for 6 days with sinus congestion,   green-yellow sputum, productive cough, sore throat, headache, facial pressure   and myalgias.  No known sick contacts.  She tried DayQuil yesterday.    REVIEW OF SYSTEMS:  CONSTITUTIONAL:  No fever.  ENT:  Positive for sore throat, nasal congestion, sinus pressure.  Positive   for dental pain.  RESPIRATORY:  Positive for cough.    INTEGUMENTARY:  No rashes.  GASTROINTESTINAL:  No vomiting or diarrhea.    PAST MEDICAL HISTORY:  Migraine, L3-L4 fusion, hysterectomy.    FAMILY HISTORY:  Unrelated.    SOCIAL HISTORY:  Nonsmoker.    ALLERGIES:  ERYTHROMYCIN, CODEINE.    MEDICATIONS:  Klonopin, tramadol.    PHYSICAL EXAMINATION:  VITAL SIGNS:  Blood pressure 121/86, pulse 77, respiratory rate 18,   temperature 97, O2 saturation 99% on room air.  Pain is an 8 out of 10.  GENERAL APPEARANCE:  This is a well-developed, well-nourished 42 year old   female, nontoxic.  She looks well.  HEENT:  Eyes:  Conjunctivae clear, lids normal.  Pupils equal, symmetrical and   normally reactive.  Anicteric.  Ears:  Unremarkable.  ENT:  The patient has    tenderness along her frontal and maxillary sinuses.  Nose with no congestion.    Tonsils are swollen bilaterally with no exudates.  She has a fractured tooth   #2.  The tooth is broken down to the pulp.  There is no surrounding gingival   swelling.  No external facial swelling.  The tongue is normal.  CARDIOVASCULAR:  Heart regular rate and rhythm.  LUNGS:  The patient has some faint wheezes at the bases of her lungs   bilaterally.  INTEGUMENTARY:  No rashes.    INITIAL ASSESSMENT AND MANAGEMENT PLAN:  This is a 42 year old female who presents with 2 complaints.  The first is   that she had dental pain ongoing for a month since she broke her tooth.  At   this time, we will cover her with another course of penicillin.  She has not   been able to see a dentist yet.  She does not have an abscess.  I did offer a   dental block and she declined.  She requested narcotics stating that she is on   tramadol  at home and needs something stronger.  I counseled her to use   ibuprofen 3 times daily.  She just took a dose an hour prior to arrival.    Regarding her URI, I counseled her that I think this is most likely viral.  It   has been 6 days.  She has been afebrile.  We will treat with a nebulizer   given that she has some wheezing.  I counseled her to use Sudafed as a   decongestant to help her with her facial pressure and congestion.    EMERGENCY DEPARTMENT COURSE:  The patient received a hand-held nebulizer by respiratory therapist for her   wheezing.     FINAL DIAGNOSES:  1.  Dental pain.  2.  Upper respiratory infection.    DISPOSITION:  The patient was discharged home in stable condition.  Given prescriptions for   penicillin.  Drink plenty of fluids.  Complete the entire course of   antibiotics.  Follow up with the dental clinic.  To return immediately if any   worsening symptoms.  Stable at this time.    The patient was personally evaluated by myself and Dr. Clydene Pugh who agrees with   the above assessment and plan.       ___________________  Pennie Rushing MD  Dictated By: Guy Sandifer, PA-C    My signature above authenticates this document and my orders, the final  diagnosis (es), discharge prescription (s), and instructions in the [facility]   Pulsecheck record.  Nursing notes have been reviewed by the physician/mid-level provider.    If you have any questions please contact [phone].    DC  D:01/30/2013 18:03:26  T: 01/30/2013 18:49:36  [id number]  Authenticated by Earvin Hansen. Clydene Pugh, M.D. On 01/30/2013 09:59:21 PM

## 2013-02-10 ENCOUNTER — Inpatient Hospital Stay
Admit: 2013-02-10 | Discharge: 2013-02-10 | Disposition: A | Discharge status: Home / Self Care | Payer: Self-pay | Attending: Emergency Medicine

## 2013-02-10 MED ADMIN — HYDROcodone-acetaminophen (NORCO) 5-325 mg per tablet 1 tablet: ORAL | @ 19:00:00 | NDC 00406012323

## 2013-02-10 NOTE — ED Provider Notes (Signed)
HPI Comments: [name] is a  42 y.o. white female non-smoker h/o ADHD, HTN, Anxiety presents to the ED via POV c/o right upper dental pain intermittent over the last several months. She is currently taking Amoxicillin, prescribed by her PCM Dr. Dione Housekeeper for Dental Infection. She reports he prescribed Tramadol but it isn't helping her discomfort. Denies fever, chills, HA, dizziness, ear pain, sinus pain/congestion/runny nose, difficulty opening mouth, tongue swelling, ST, difficulty swallowing, choking sensation, neck pain/stiffness/swelling, CP, SOB, Palps, cough, abd pain, n/v/d. Denies ameliorating factors.             Past Medical History   Diagnosis Date   ??? Adult ADHD    ??? Hypertension    ??? Anxiety    ??? Musculoskeletal disorder         Past Surgical History   Procedure Laterality Date   ??? Hx orthopaedic           No family history on file.     History     Social History   ??? Marital Status: MARRIED     Spouse Name: N/A     Number of Children: N/A   ??? Years of Education: N/A     Occupational History   ??? Not on file.     Social History Main Topics   ??? Smoking status: Never Smoker    ??? Smokeless tobacco: Not on file   ??? Alcohol Use: Yes   ??? Drug Use: No   ??? Sexually Active: Not on file     Other Topics Concern   ??? Not on file     Social History Narrative   ??? No narrative on file                  ALLERGIES: Codeine and Erythromycin      Review of Systems   Constitutional: Negative for fever and chills.   HENT: Positive for dental problem. Negative for ear pain, congestion, sore throat, facial swelling, trouble swallowing, neck pain, voice change and sinus pressure.    Eyes: Negative for pain and visual disturbance.   Respiratory: Negative for cough and shortness of breath.    Cardiovascular: Negative for chest pain and palpitations.   Gastrointestinal: Negative for nausea, vomiting, abdominal pain and diarrhea.   Genitourinary: Negative for dysuria, urgency and frequency.   Musculoskeletal: Negative for joint  swelling and arthralgias.   Skin: Negative for color change, pallor, rash and wound.   Neurological: Negative for dizziness and headaches.   Psychiatric/Behavioral: Negative for behavioral problems. The patient is not nervous/anxious.        There were no vitals filed for this visit.         Physical Exam   Nursing note and vitals reviewed.  Constitutional: She appears well-developed and well-nourished. No distress.   HENT:   Head: Normocephalic and atraumatic. No trismus in the jaw.   Right Ear: External ear normal.   Left Ear: External ear normal.   Nose: Nose normal.   Mouth/Throat: Uvula is midline, oropharynx is clear and moist and mucous membranes are normal. She does not have dentures. No oral lesions. Abnormal dentition. Dental caries present. No dental abscesses, edematous or lacerations. No oropharyngeal exudate, posterior oropharyngeal edema, posterior oropharyngeal erythema or tonsillar abscesses.   Dental pain at # 1.   There IS evidence of dental decay.   There IS tenderness on palpation.  The airway IS patent.   NO adjacent mucobuccal soft tissue swelling, fluctuance or  gingival bleeding.   NO pus/drainage.   NO swelling or elevation of the tongue.   NO sublingual swelling or TTP.   NO facial swelling.  NO neck swelling.        Eyes: Conjunctivae and EOM are normal. Pupils are equal, round, and reactive to light.   Neck: Normal range of motion. Neck supple. No tracheal deviation present.   Supple without swelling or LAD.    Cardiovascular: Normal rate, regular rhythm and normal heart sounds.    Pulmonary/Chest: Effort normal and breath sounds normal. No stridor.   Lymphadenopathy:     She has no cervical adenopathy.   Skin: She is not diaphoretic.        MDM     Differential Diagnosis; Clinical Impression; Plan:     DDX: Periapical abscess, Trigeminal neuralgia, Masticator space infection, Ludwig's angina, Retropharyngeal space infection, Infection after root canal, Dental caries, Odontalgia, Dry  Socket, Acute Necrotizing Ulcerative Gingivitis (ANUG).    Continue taking Amoxicillin. Tylenol/Motrin prn pain. Norco prn breakthrough pain. F/U Dental in 1 day. Drink plenty of water. Soft foods only. Luke warm solids & liquids. Warm salt water rinses TID. Listerine rinses TID. RT ED with any worsening s/sx.     PLEASE FOLLOW-UP AS DIRECTED WITHOUT FAIL WITHIN THE TIME FRAME RECOMMENDED AS FAILURE TO DO SO COULD RESULT IN WORSENING OF YOUR PHYSICAL CONDITION, DEATH, AND OR PERMANENT DISABILITY.     RETURN TO THE EMERGENCY DEPARTMENT IF YOU ARE UNABLE TO FOLLOW-UP AS DIRECTED.     RETURN TO THE EMERGENCY DEPARTMENT IF YOU HAVE SYMPTOMS THAT DO NOT IMPROVE WITH TREATMENT, NEW SYMPTOMS, WORSENING SYMPTOMS, OR ANY OTHER CONCERNS.             Amount and/or Complexity of Data Reviewed:    Decide to obtain previous medical records or to obtain history from someone other than the patient:  Yes   Obtain history from someone other than the patient:  No   Review and summarize past medical records:  Yes   Discuss the patient with another provider:  No   Independant visualization of image, tracing, or specimen:  Yes  Risk of Significant Complications, Morbidity, and/or Mortality:   Presenting problems:  Moderate  Diagnostic procedures:  Low  Management options:  Moderate  Progress:   Patient progress:  Stable      Procedures    Diagnosis:   1. Odontalgia    2. Dental decay          Disposition: HOME    Follow-up Information    None          Patient's Medications   Start Taking    No medications on file   Continue Taking    ALBUTEROL (PROVENTIL HFA, VENTOLIN HFA) 90 MCG/ACTUATION INHALER    Take 2 Puffs by inhalation every four (4) hours as needed for Wheezing or Shortness of Breath (Cough).    ALPRAZOLAM (XANAX) 1 MG TABLET    Take 1 mg by mouth four (4) times daily.    DEXTROAMPHETAMINE-AMPHETAMINE (ADDERALL) 30 MG TABLET    Take 30 mg by mouth two (2) times a day.    INHALATIONAL SPACING DEVICE    1 Each by Does Not Apply  route as needed (USE WITH INHALER).    OXYCODONE-ACETAMINOPHEN (PERCOCET) 5-325 MG PER TABLET    Take 1 tablet by mouth every four (4) hours as needed for Pain for up to 6 doses.    PROPRANOLOL LA (INDERAL LA) 80 MG SR  CAPSULE    Take 80 mg by mouth daily.   These Medications have changed    No medications on file   Stop Taking    No medications on file       No results found for this or any previous visit (from the past 24 hour(s)).

## 2013-02-10 NOTE — ED Provider Notes (Signed)
I was personally available for consultation in the emergency department. I have reviewed the chart prior to the patient's discharge and agree with the documentation recorded by the MLP, including the assessment, treatment plan, and disposition.

## 2013-02-10 NOTE — ED Notes (Signed)
Patient given verbal and written discharge instructions. Pt stated understanding and no further questions. Patient in NAD at time of discharge. Pt ambulatory to home.

## 2013-02-10 NOTE — ED Notes (Signed)
Pt. States she had toothache  Right upper jaw on & off for a month , worst in last 3 days

## 2013-02-13 MED ADMIN — oxyCODONE-acetaminophen (PERCOCET) 5-325 mg per tablet 1 tablet: ORAL | @ 20:00:00 | NDC 68308040547

## 2013-02-13 NOTE — ED Provider Notes (Signed)
HPI Comments: 42yo female presents to ER complaining of right upper dental pain.  Pain radiates to right ear.  Patient has been seen here in the ER several times for dental pain.  States she called one of the places on the dental referral sheet but was told she would need $100.      Patient is a 42 y.o. female presenting with dental problem.   Dental Pain            Past Medical History   Diagnosis Date   ??? Adult ADHD    ??? Hypertension    ??? Anxiety    ??? Musculoskeletal disorder         Past Surgical History   Procedure Laterality Date   ??? Hx orthopaedic           No family history on file.     History     Social History   ??? Marital Status: MARRIED     Spouse Name: N/A     Number of Children: N/A   ??? Years of Education: N/A     Occupational History   ??? Not on file.     Social History Main Topics   ??? Smoking status: Never Smoker    ??? Smokeless tobacco: Not on file   ??? Alcohol Use: Yes   ??? Drug Use: No   ??? Sexually Active: Not on file     Other Topics Concern   ??? Not on file     Social History Narrative   ??? No narrative on file                  ALLERGIES: Codeine and Erythromycin      Review of Systems   Constitutional: Negative for fever, chills, activity change and appetite change.   HENT: Positive for dental problem. Negative for sore throat, facial swelling, drooling, mouth sores, trouble swallowing, neck pain and voice change.    Hematological: Negative for adenopathy.   All other systems reviewed and are negative.        Filed Vitals:    02/13/13 1442   BP: 121/89   Pulse: 93   Temp: 98.2 ??F (36.8 ??C)   Resp: 16   Height: 5\' 6"  (1.676 m)   Weight: 90.719 kg (200 lb)   SpO2: 100%            Physical Exam   Nursing note and vitals reviewed.  Constitutional: She is oriented to person, place, and time. She appears well-developed and well-nourished. No distress.   HENT:   Mouth/Throat: Oropharynx is clear and moist.   Tooth #2 decayed. No gingival swelling or erythema.  Rest of dentition looks normal.     Pulmonary/Chest: Effort normal.   Neurological: She is alert and oriented to person, place, and time.   Skin: She is not diaphoretic.   Psychiatric: She has a normal mood and affect.        MDM     Differential Diagnosis; Clinical Impression; Plan:     42yo female with dental pain secondary to dental decay.  Rx for Motrin written.  One percocet given here.  Patient does not warranted abx.  Advised to continue quest to follow up with dentist.       Procedures

## 2013-02-13 NOTE — ED Provider Notes (Signed)
I was personally available for consultation in the emergency department. I have reviewed the chart prior to the patient's discharge and agree with the documentation recorded by the MLP, including the assessment, treatment plan, and disposition.

## 2013-02-13 NOTE — ED Notes (Signed)
Right side dental pain x 7 days

## 2013-02-15 MED ADMIN — oxyCODONE-acetaminophen (PERCOCET) 5-325 mg per tablet 2 tablet: ORAL | @ 15:00:00 | NDC 68084035511

## 2013-02-15 NOTE — ED Notes (Signed)
"  My tooth is still killing me."

## 2013-02-15 NOTE — ED Provider Notes (Signed)
HPI Comments: 42yo female presents to the ER complaining of right jaw pain with radiation of pain to right ear.  Problem is CHRONIC and has been seen her multiple times for same, 2 days ago by myself.   Patient taking ultram and motrin without relief.  Patient states she has appointment for this Tuesday ( 5 days from now) for further evaluation (and hopefully extraction).  Denies any sore throat, fever, facial or gum swelling.     Patient is a 42 y.o. female presenting with dental injury.   Dental Injury            Past Medical History   Diagnosis Date   ??? Adult ADHD    ??? Hypertension    ??? Anxiety    ??? Musculoskeletal disorder         Past Surgical History   Procedure Laterality Date   ??? Hx orthopaedic           History reviewed. No pertinent family history.     History     Social History   ??? Marital Status: MARRIED     Spouse Name: N/A     Number of Children: N/A   ??? Years of Education: N/A     Occupational History   ??? Not on file.     Social History Main Topics   ??? Smoking status: Never Smoker    ??? Smokeless tobacco: Not on file   ??? Alcohol Use: Yes   ??? Drug Use: No   ??? Sexually Active: Not on file     Other Topics Concern   ??? Not on file     Social History Narrative   ??? No narrative on file                  ALLERGIES: Codeine and Erythromycin      Review of Systems   Constitutional: Negative for fever, activity change and appetite change.   HENT: Positive for ear pain and dental problem. Negative for congestion, sore throat, facial swelling, drooling, mouth sores, trouble swallowing, neck pain, voice change and sinus pressure.    Eyes: Negative.    Skin: Negative.    Neurological: Positive for headaches.   All other systems reviewed and are negative.        Filed Vitals:    02/15/13 1013   BP: 135/94   Pulse: 120   Temp: 98.3 ??F (36.8 ??C)   Resp: 16   SpO2: 97%            Physical Exam   Nursing note and vitals reviewed.  Constitutional: She appears well-developed and well-nourished. No distress.   HENT:   Right  Ear: Hearing, tympanic membrane, external ear and ear canal normal.   Left Ear: Hearing, tympanic membrane, external ear and ear canal normal.   Mouth/Throat: Oropharynx is clear and moist.   Tooth # 2 with posterior decay.  No gingival swelling or erythema.   Eyes: EOM are normal.   Neck: Normal range of motion. Neck supple.   Pulmonary/Chest: Effort normal.   Musculoskeletal: Normal range of motion.   Lymphadenopathy:     She has no cervical adenopathy.   Neurological: She is alert.   Skin: She is not diaphoretic.   Psychiatric: She has a normal mood and affect.        MDM     Differential Diagnosis; Clinical Impression; Plan:     42yo female with chronic dental pain seen multiple times in the  ER for same.   Dental block performed to with good results and 2 percocet given here.  Patient has dental follow up in 5 days.  No abx warranted.       Nerve Block  Date/Time: 02/15/2013 10:10 AM  Performed by: Jenean Lindau, Maeson Purohit LLOYD  Authorized by: Arlana Lindau  Consent: Verbal consent obtained. Written consent not obtained.  Risks and benefits: risks, benefits and alternatives were discussed  Consent given by: patient  Time out: Immediately prior to procedure a "time out" was called to verify the correct patient, procedure, equipment, support staff and site/side marked as required.  Indications: pain relief  Body area: face/mouth  Nerve: infraorbital  Laterality: right  Patient sedated: no  Patient position: supine  Needle gauge: 27 G  Location technique: anatomical landmarks  Local anesthetic: bupivacaine 0.5% without epinephrine  Anesthetic total: 3.5 ml  Outcome: pain improved  Patient tolerance: Patient tolerated the procedure well with no immediate complications.

## 2013-02-15 NOTE — ED Provider Notes (Signed)
I was personally available for consultation in the emergency department. I have reviewed the chart prior to the patient's discharge and agree with the documentation recorded by the MLP, including the assessment, treatment plan, and disposition.

## 2013-02-17 MED ADMIN — naproxen (NAPROSYN) tablet 500 mg: ORAL | @ 19:00:00 | NDC 00093014701

## 2013-02-17 NOTE — ED Notes (Signed)
Pt. States she had toothache right upper  Jaw,  Was seen  Here  Last week  For same problem

## 2013-02-17 NOTE — ED Provider Notes (Signed)
I was personally available for consultation in the emergency department. I have reviewed the chart prior to the patient's discharge and agree with the documentation recorded by the MLP, including the assessment, treatment plan, and disposition.

## 2013-02-17 NOTE — ED Provider Notes (Signed)
HPI Comments: Patient is a 42 y.o. Caucasian female with the following medical history:   Past Medical History:    Adult ADHD                                                    Hypertension                                                  Anxiety                                                       Musculoskeletal disorder                                    Presents to the ED with right upper Dental Pain from a rear molar that broke and she is going to see the [facility] on [address] on Tuesday but the dental block from last week wore off and she has nothing for pain control.  She denies inability to control oral secretions, trouble swallowing, choking, spiting out purulence or facial edema,  headache, change or blurry vision, fever/chills, CP, palpitations, SOB, diaphoresis, orthopnea, abd or back pain, extremity edema/weakness/numbness/parestheisa, hot flashes, night sweats or unexplained change of weight.           Patient is a 42 y.o. female presenting with dental problem. The history is provided by the patient.   Dental Pain   This is a recurrent problem. The current episode started 2 days ago. The problem occurs constantly. The problem has been gradually worsening. The pain is located in the right upper mouth.The quality of the pain is dull and aching.  The pain is at a severity of 8/10. The pain is moderate. There was no vomiting, no nausea, no fever, no swelling, no chest pain, no shortness of breath, no headaches, no gum redness and no drainage. She has tried rest (antibiotic completed) for the symptoms. The treatment provided mild relief. The patient has no cardiac history.       Past Medical History   Diagnosis Date   ??? Adult ADHD    ??? Hypertension    ??? Anxiety    ??? Musculoskeletal disorder         Past Surgical History   Procedure Laterality Date   ??? Hx orthopaedic           History reviewed. No pertinent family history.     History     Social History   ??? Marital Status: MARRIED     Spouse Name:  N/A     Number of Children: N/A   ??? Years of Education: N/A     Occupational History   ??? Not on file.     Social History Main Topics   ??? Smoking status: Never Smoker    ??? Smokeless tobacco: Not on file   ??? Alcohol Use: Yes   ??? Drug Use: No   ??? Sexually Active:  Not on file     Other Topics Concern   ??? Not on file     Social History Narrative   ??? No narrative on file                  ALLERGIES: Codeine and Erythromycin      Review of Systems   Constitutional: Negative for fever, chills, activity change and appetite change.   HENT: Positive for dental problem. Negative for trouble swallowing, neck pain, neck stiffness and voice change.    Eyes: Negative for discharge, redness and visual disturbance.   Respiratory: Negative for cough, chest tightness and shortness of breath.    Cardiovascular: Negative for chest pain and leg swelling.   Gastrointestinal: Negative for nausea, vomiting, abdominal pain, diarrhea and constipation.   Genitourinary: Negative for dysuria, urgency, frequency, flank pain and difficulty urinating.   Musculoskeletal: Negative for back pain and joint swelling.   Skin: Negative for color change, rash and wound.   Neurological: Negative for dizziness, speech difficulty and headaches.   Psychiatric/Behavioral: Negative for behavioral problems and agitation. The patient is not nervous/anxious.        Filed Vitals:    02/17/13 1352   BP: 133/87   Pulse: 102   Temp: 98 ??F (36.7 ??C)   Resp: 16   Height: 5\' 6"  (1.676 m)   Weight: 90.719 kg (200 lb)   SpO2: 99%            Physical Exam   Constitutional: She is oriented to person, place, and time. She appears well-developed and well-nourished. No distress.   HENT:   Head: Normocephalic and atraumatic.   Right Ear: Hearing normal.   Left Ear: Hearing normal.   Nose: Nose normal.   Mouth/Throat: Uvula is midline, oropharynx is clear and moist and mucous membranes are normal. Abnormal dentition. Dental caries present.       Eyes: Conjunctivae are normal. Pupils  are equal, round, and reactive to light.   Neck: Normal range of motion.   Cardiovascular: Normal rate.    Pulmonary/Chest: Effort normal. No respiratory distress.   Neurological: She is alert and oriented to person, place, and time. She has normal reflexes.   Skin: Skin is warm and dry.   Psychiatric: She has a normal mood and affect. Her behavior is normal. Judgment and thought content normal.        MDM    Procedures

## 2013-02-17 NOTE — ED Notes (Addendum)
I have reviewed discharge instructions with the patient.  The patient verbalized understanding. Patient was discharged before assessment could be completed.

## 2013-03-22 HISTORY — PX: OTHER SURGICAL HISTORY: SHX169

## 2013-05-09 MED ORDER — OXYCODONE-ACETAMINOPHEN 5 MG-325 MG TAB
5-325 mg | ORAL_TABLET | ORAL | Status: DC | PRN
Start: 2013-05-09 — End: 2013-06-07

## 2013-05-09 MED ORDER — CYCLOBENZAPRINE 10 MG TAB
10 mg | ORAL_TABLET | Freq: Three times a day (TID) | ORAL | Status: DC | PRN
Start: 2013-05-09 — End: 2013-06-07

## 2013-05-09 MED ORDER — OXYCODONE-ACETAMINOPHEN 5 MG-325 MG TAB
5-325 mg | ORAL | Status: AC
Start: 2013-05-09 — End: 2013-05-09
  Administered 2013-05-09: 18:00:00 via ORAL

## 2013-05-09 MED FILL — OXYCODONE-ACETAMINOPHEN 5 MG-325 MG TAB: 5-325 mg | ORAL | Qty: 2

## 2013-05-09 NOTE — ED Notes (Signed)
Was moving boxes and hurt her back

## 2013-05-09 NOTE — ED Notes (Signed)
I have reviewed discharge instructions with the patient.  The patient verbalized understanding.

## 2013-05-09 NOTE — ED Provider Notes (Addendum)
HPI Comments: [name] is a 43 year old female with a PMHx ADHD, HTN, and Chronic Back Pain presents to ER c/o lower back pain.  Patient reports onset of symptom started this am.  Patient states, "I was moving a box of can goods this morning at home. My back tighten up."  Patient reports box weighed approximately 20 pounds.  Denies pain radiating down leg/legs, numbness/tingling, loss of bowel/bladder.  Patient reports she took over the counter Motrin with minimal relief.  Denies fever, chills, fatigue, headache, dizziness, CP, Palpitations, SOB, abdominal pain, N/V/D, flank pain, urinary sx's, or any other concerns.      Patient is a 43 y.o. female presenting with back pain. The history is provided by the patient.   Back Pain          Past Medical History   Diagnosis Date   ??? Adult ADHD    ??? Hypertension    ??? Anxiety    ??? Musculoskeletal disorder    ??? Chronic back pain         Past Surgical History   Procedure Laterality Date   ??? Hx orthopaedic           No family history on file.     History     Social History   ??? Marital Status: MARRIED     Spouse Name: N/A     Number of Children: N/A   ??? Years of Education: N/A     Occupational History   ??? Not on file.     Social History Main Topics   ??? Smoking status: Never Smoker    ??? Smokeless tobacco: Not on file   ??? Alcohol Use: Yes   ??? Drug Use: No   ??? Sexual Activity: Not on file     Other Topics Concern   ??? Not on file     Social History Narrative                  ALLERGIES: Codeine and Erythromycin      Review of Systems   Constitutional: Negative.    HENT: Negative.    Respiratory: Negative.    Cardiovascular: Negative.    Gastrointestinal: Negative.    Genitourinary: Negative.    Musculoskeletal: Positive for back pain.   Skin: Negative.    Neurological: Negative.        Filed Vitals:    05/09/13 1228   BP: 123/84   Pulse: 98   Temp: 98.2 ??F (36.8 ??C)   Resp: 20   Height: 5\' 6"  (1.676 m)   Weight: 90.719 kg (200 lb)   SpO2: 100%            Physical Exam    Constitutional: She is oriented to person, place, and time. She appears well-developed and well-nourished. No distress.   HENT:   Right Ear: Hearing, tympanic membrane, external ear and ear canal normal.   Left Ear: Hearing, tympanic membrane, external ear and ear canal normal.   Nose: Nose normal.   Mouth/Throat: Uvula is midline, oropharynx is clear and moist and mucous membranes are normal. No oropharyngeal exudate.   Neck: Normal range of motion and full passive range of motion without pain. Neck supple. No spinous process tenderness and no muscular tenderness present. No rigidity. No edema, no erythema and normal range of motion present.   Cardiovascular: Normal rate, regular rhythm, normal heart sounds and intact distal pulses.  Exam reveals no gallop and no friction rub.  No murmur heard.  Pulmonary/Chest: Effort normal and breath sounds normal. No respiratory distress. She has no wheezes. She has no rales. She exhibits no tenderness.   Abdominal: Soft. Normal appearance and bowel sounds are normal. She exhibits no shifting dullness, no distension and no mass. There is no tenderness. There is no rigidity, no rebound, no guarding, no CVA tenderness, no tenderness at McBurney's point and negative Murphy's sign.   Musculoskeletal: Normal range of motion.        Cervical back: Normal.        Thoracic back: Normal.        Lumbar back: She exhibits tenderness. She exhibits normal range of motion, no bony tenderness, no swelling, no edema and no deformity.   +TTP bilateral lower lumbar muscles.  No midline spinous tenderness to C/T/L.  Negative SLR. No foot drop.  Patient has FROM of all extremities.   Neurological: She is alert and oriented to person, place, and time. She has normal strength and normal reflexes. She is not disoriented. She displays no atrophy and no tremor. No sensory deficit. She exhibits normal muscle tone. Coordination and gait normal.   Skin: Skin is warm and dry. She is not diaphoretic.    Psychiatric: She has a normal mood and affect. Her speech is normal and behavior is normal. Judgment and thought content normal. Cognition and memory are normal.   Nursing note and vitals reviewed.       MDM  Number of Diagnoses or Management Options  Low back strain:   Diagnosis management comments: Muscular Strain, Spinal Stenosis, Sciatica, Compression FX, DJD, Spondylolysis, Inflammatory Spondyloarthropathy, Cauda Equina Syndrome    Clinical Impression/Plan: Patient stable condition.  No midline bony spinous tenderness to C/T/L.  Negative SLR. No foot drop.  No x-ray required. Will prescribe flexeril and Percocet.  Will refer to Ortho.  Follow up with PCP in 2-4 days without failure.  If symptoms worsen or have any other concerns, return to ER.  Patient verbalizes discharge instructions.        Amount and/or Complexity of Data Reviewed  Review and summarize past medical records: yes    Risk of Complications, Morbidity, and/or Mortality  Presenting problems: low  Diagnostic procedures: low  Management options: low        Procedures

## 2013-06-07 MED ORDER — IBUPROFEN 600 MG TAB
600 mg | ORAL_TABLET | Freq: Three times a day (TID) | ORAL | Status: AC | PRN
Start: 2013-06-07 — End: 2013-06-14

## 2013-06-07 MED ORDER — TRAMADOL 50 MG TAB
50 mg | ORAL_TABLET | Freq: Four times a day (QID) | ORAL | Status: DC | PRN
Start: 2013-06-07 — End: 2014-07-10

## 2013-06-07 MED ORDER — METHOCARBAMOL 750 MG TAB
750 mg | ORAL_TABLET | Freq: Three times a day (TID) | ORAL | Status: DC
Start: 2013-06-07 — End: 2013-08-14

## 2013-06-07 NOTE — ED Notes (Signed)
I have reviewed discharge instructions with the patient.  The patient verbalized understanding.

## 2013-06-07 NOTE — ED Notes (Signed)
"  I moved a washing machine last night and now my back is killing me."

## 2013-06-07 NOTE — ED Provider Notes (Addendum)
HPI Comments: 43yo female presents to ER complaining of left lower back pain.  Patient states she was pulling on washing machine to move it and felt pop in lower back follow by pain.  Pain worse with movement.  No radiation of pain.  History of similar lifting injury one month ago.  Patient states she is taking Motrin without relief.       Patient is a 43 y.o. female presenting with back pain.   Back Pain   Pertinent negatives include no fever, no numbness, no abdominal pain, no dysuria, no pelvic pain and no weakness.        Past Medical History   Diagnosis Date   ??? Adult ADHD    ??? Hypertension    ??? Anxiety    ??? Musculoskeletal disorder    ??? Chronic back pain         Past Surgical History   Procedure Laterality Date   ??? Hx orthopaedic           No family history on file.     History     Social History   ??? Marital Status: MARRIED     Spouse Name: N/A     Number of Children: N/A   ??? Years of Education: N/A     Occupational History   ??? Not on file.     Social History Main Topics   ??? Smoking status: Never Smoker    ??? Smokeless tobacco: Not on file   ??? Alcohol Use: Yes   ??? Drug Use: No   ??? Sexual Activity: Not on file     Other Topics Concern   ??? Not on file     Social History Narrative                  ALLERGIES: Codeine and Erythromycin      Review of Systems   Constitutional: Negative for fever and appetite change.   Respiratory: Negative.    Cardiovascular: Negative.    Gastrointestinal: Negative for nausea, vomiting and abdominal pain.   Genitourinary: Negative for dysuria, flank pain, difficulty urinating and pelvic pain.   Musculoskeletal: Positive for back pain. Negative for gait problem and neck pain.   Neurological: Negative for weakness and numbness.       Filed Vitals:    06/07/13 1036   BP: 128/89   Pulse: 93   Temp: 98.3 ??F (36.8 ??C)   Resp: 16   Height: 5\' 6"  (1.676 m)   Weight: 90.719 kg (200 lb)   SpO2: 100%            Physical Exam   Constitutional: She is oriented to person, place, and time. She  appears well-developed and well-nourished. No distress.   Cardiovascular: Normal rate, regular rhythm and normal heart sounds.    Pulmonary/Chest: Effort normal and breath sounds normal.   Musculoskeletal:   Left lumbosacral area TTP.  Pain with rotation and flexion, mainly with extension.  Normal gait.    Neurological: She is alert and oriented to person, place, and time.   Skin: She is not diaphoretic.   Psychiatric: She has a normal mood and affect.   Nursing note and vitals reviewed.       MDM  Number of Diagnoses or Management Options  Diagnosis management comments: 43yo female with left lower back pain secondary to strain while attempting to move a washing machine by herself.  When I suggested to continue taking the motrin and I would Rx  for some muscle relaxer she inquired what I was going to give her for pain.  I reiterated Motrin and muscle relaxer, she requests Ultram "because it hurts bad and I need something now."  Rx for 10 ultram written.  I feel patient is personally seeking or seeking for her husband.   Advised to follow up with PCP.      Procedures

## 2013-06-09 NOTE — ED Provider Notes (Signed)
[facility]  EMERGENCY DEPARTMENT TREATMENT REPORT  NAME:  [name], [name]  SEX:   F  ADMIT: 06/09/2013  DOB:   [birth year]  MR#    [id number]  ROOM:    TIME DICTATED: 01 21 PM  ACCT#  [id number]        PRIMARY CARE PHYSICIAN:  None.    CHIEF COMPLAINT:  Abdominal pain.    HISTORY OF PRESENT ILLNESS:  This is a 43 year old female with a history of partial hysterectomy in 2014,   who after having painful intercourse with her husband yesterday evening has   been experiencing lower abdomen and pelvic pain.  Pain is mainly on the right.   It is a constant aching pain that is nonradiating.  She has also had some   mild dysuria.  She has had no vaginal bleeding.  Denies nausea or vomiting.    REVIEW OF SYSTEMS:  CONSTITUTIONAL:  No fever.  RESPIRATORY:  No cough, shortness of breath, or wheezing.   CARDIOVASCULAR:  No chest pain, chest pressure, or palpitations.   GASTROINTESTINAL:  Lower abdominal pain.  No nausea, vomiting, diarrhea.  GENITOURINARY:  Mild dysuria, no frequency, urgency or vaginal bleeding.  MUSCULOSKELETAL:  No joint pain or swelling.   INTEGUMENTARY:  No rashes.     PAST MEDICAL HISTORY:  Partial hysterectomy.    SOCIAL HISTORY:  No tobacco or alcohol.    FAMILY HISTORY:  None.    ALLERGIES:  MULTIPLE AND REVIEWED IN IBEX.    MEDICATIONS:  None.    PHYSICAL EXAMINATION:  VITAL SIGNS:  Blood pressure 111/84, pulse 121, respirations 22, temperature   98.2, oxygen 99% on room air.  GENERAL APPEARANCE:  The patient appears uncomfortable but otherwise well   developed and well nourished.   RESPIRATORY:  Clear and equal breath sounds.  No respiratory distress,   tachypnea, or accessory muscle use.     CARDIOVASCULAR:   Heart regular, without murmurs, gallops, rubs, or thrills.   CHEST:  Chest symmetrical without masses or tenderness.    GASTROINTESTINAL: Abdomen soft, nondistended.  Tender in right lower quadrant.  GENITOURINARY:  Bimanual exam with tenderness on the right.  No palpable    masses.  No vaginal bleeding.  No flank tenderness.  MUSCULOSKELETAL: Stance and gait appear normal.   SKIN:  Warm and dry without rashes.     CONTINUATION BY Orma Flaming, MD:    INITIAL ASSESSMENT AND MANAGEMENT PLAN:  This is a new problem for this patient.     DIAGNOSTIC STUDIES:  Ultrasound of the pelvis shows normal blood flow to both ovaries.  There is   bilateral complex ovarian cysts bilaterally likely representing hemorrhagic   cysts.  There is free fluid in the pelvis likely representing hemorrhagic   fluid.  Six to 8 ultrasound suggested to confirm resolution.  CBC:  White   blood cell count 15.1, hemoglobin 15.9, hematocrit 47.6. Platelet count is   normal.  Comprehensive metabolic panel:  Chloride 109, otherwise normal.    Urinalysis:  Positive nitrites, 1 to 4 white blood cells, 1+ bacteria,   otherwise normal.      EMERGENCY DEPARTMENT COURSE:  The patient has been quite stable while in the Emergency Department.  She was   initially mildly tachycardic but was given a liter of normal saline and pulses   came down to 73.  She has maintained good blood pressure.  She is feeling   much better.  She has been treated with ibuprofen, morphine  for pain control.        I have discussed with Dr. Latanya MaudlinGrimes, the patient was entirely stable at this   time.  This incident occurred yesterday and so at this point I think she is   stable.  We have done repeat hemoglobin and hematocrit which is 13.6 and 40.    This is after receiving IV fluids so she has really not dropped very much.    Hemoglobin has dropped 2 points.  I believe this is mostly dilutional.  At   this point she is feeling much better.  I have spoken to Dr. Latanya MaudlinGrimes who is on   call for OB/GYN.  He will follow her up in the office and I believe the   patient is safe for discharge at this time.  She has been given very good   instructions to come back for worsening pain or any other concerns.     CONTINUATION BY Posey ProntoOBERT E. Jaze Rodino, M.D.      DATE OF SERVICE:  06/09/2013     INITIAL ASSESSMENT AND MANAGEMENT PLAN:   This is a new problem for this patient.      LABORATORY STUDIES:    The following were obtained:  CBC white blood cell count 15.1, hemoglobin   15.9, hematocrit 47.6, platelet count 258.  Comprehensive metabolic panel   chloride 109, otherwise normal.  Urinalysis positive nitrites, 1 to 4 white   blood cells and 15 to 29 epithelial cells.     DIAGNOSTIC STUDIES:   Abdominal ultrasound shows bilateral complex ovarian cysts likely representing   hemorrhagic cysts, a small amount of hemorrhagic free fluid in the pelvis and   6 to 8 week followup is suggested to confirm resolution and normal blood flow   to both ovaries.      EMERGENCY DEPARTMENT COURSE:   The patient has been stable while in the Emergency Department.  She has   maintained good blood pressure the entire time she has been here.  She was   tachycardic upon arrival but received 1 liter of IV normal saline.  The   patient's pulse has come down to 73.      She received IV morphine for pain management.  She is feeling much better at   this time and has remained with a good blood pressure.  I discussed with Dr.   Latanya MaudlinGrimes who recommended repeating hemoglobin and hematocrit.  I got a bedside   hemoglobin and hematocrit which showed hemoglobin 13.6 and hematocrit 40, so   the patient is maintaining a good hemoglobin and hematocrit.  There has been a   slight drop in 2 points of hemoglobin but the patient has been given IV   fluids.    I contacted Dr. Allena KatzPatel, the radiologist, who read the ultrasound to get a   confirmation of the free fluid in the pelvis.  It said it was a small amount.    Given that, the patient's stability, I believe that she is safe for discharge   at this point.  Dr. Latanya MaudlinGrimes will follower her up in the office.      They have been given extremely good instructions to come back for any   worsening pain, any lightheadedness or any other concerns.     CLINICAL IMPRESSIONS AND DIAGNOSES:  1.  Ovarian cysts.  2.  Nausea and pelvic pain.    DISPOSITION AND PLAN:   She is discharged in stable condition.  She is to come back  for worsening   pain, fever, nausea or vomiting, lightheadedness or any other concerns.    Otherwise, contact Dr. Latanya Maudlin on Monday for followup.      ___________________  Posey Pronto MD  Dictated By: Caprice Renshaw E. Mertie Moores, PA-C    My signature above authenticates this document and my orders, the final  diagnosis (es), discharge prescription (s), and instructions in the [facility]   Pulsecheck record.  Nursing notes have been reviewed by the physician/mid-level provider.    If you have any questions please contact [phone].      D:06/09/2013 13:21:07  T: 06/09/2013 15:34:28  [id number]  Authenticated by Posey Pronto, M.D. On 06/14/2013 03:19:45 PM

## 2013-06-11 NOTE — ED Provider Notes (Signed)
[facility]  EMERGENCY DEPARTMENT TREATMENT REPORT  NAME:  [name], [name]  SEX:   F  ADMIT: 06/11/2013  DOB:   24-May-1970  MR#    [id number]  ROOM:    TIME DICTATED: 01 27 PM  ACCT#  [id number]        PRIMARY CARE PHYSICIAN:  None.    OB/GYN PHYSICIAN:  None.    TIME OF EVALUATION:  1134    CHIEF COMPLAINT:  Pelvic pain.    HISTORY OF PRESENT ILLNESS:  A 43 year old female presents G2, P2, A0,  who previously underwent a partial   hysterectomy in 2014, presents complaining of a 9 out of 10, sharp, constant   right lower quadrant pelvic pain.  She was seen in our facility 2 days ago   after she had painful intercourse.  Her pain was right-sided.  She had a   transvaginal ultrasound that showed bilateral complex ovarian cysts,   hemorrhagic free fluid in the pelvis.  Her white count was 15.  She was given   information for Dr. Latanya MaudlinGrimes to follow up with.  She has not followed up with   him.  She was told that she thinks she may be able to get into his office   later on today.  She states she was taking Percocet, but has ran out of   Percocet and now her pain has increased.  She denies any vaginal discharge or   bleeding.  No dysuria.  Denies nausea, vomiting or diarrhea.    REVIEW OF SYSTEMS:  CONSTITUTIONAL:  No fever, no chills.  EYES:  No visual changes.  ENT:  No URI symptoms.  HEMATOLOGIC:  No bruising.  RESPIRATORY:  No difficulty breathing.  CARDIOVASCULAR:  No chest pain.  GASTROINTESTINAL:  Denies nausea, vomiting or diarrhea.  GENITOURINARY:  Positive for pelvic pain.  Denies vaginal bleeding or   discharge, no dysuria.  INTEGUMENTARY:  No open lesions.  NEUROLOGICAL:  No headaches.  Denies any other complaints.    PAST MEDICAL HISTORY:  Chronic back pain, hysterectomy.    FAMILY HISTORY:  Unrelated.    SOCIAL HISTORY:  Nonsmoker.    ALLERGIES:  CODEINE, ERYTHROMYCIN.    MEDICATIONS:  Percocet.    PHYSICAL EXAMINATION:  VITAL SIGNS:  Blood pressure 122/81, pulse 80, respiratory rate 18,    temperature 98.1, O2 saturation 99% on room air, pain is a 10 out of 10.  GENERAL APPEARANCE:  A well-developed, well-nourished 43 year old female,   pleasant, nontoxic, overweight.  HEENT: Eyes:  Conjunctivae clear, lids normal.  Pupils equal, symmetrical, and   normally reactive. Nonicteric.  Mouth/Throat:  Surfaces of the pharynx,   palate, and tongue are pink, moist, and without lesions.   RESPIRATORY:  Clear and equal breath sounds.  No respiratory distress,   tachypnea, or accessory muscle use.   CARDIOVASCULAR:  Heart regular, without murmurs, gallops, rubs, or thrills.   CHEST:  Chest symmetrical without masses or tenderness.     GASTROINTESTINAL:  The abdomen is soft.  Pain reproduced in the right lower   quadrant.  No distension, no organomegaly, no masses.  Minimal pain in the   left lower quadrant.  SKIN:  Warm and dry.    INITIAL ASSESSMENT AND MANAGEMENT PLAN:  A 43 year old female presents for evaluation of right lower quadrant pelvic   pain.  She was seen in our facility 2 days ago, had an ultrasound that showed   a complex hemorrhagic cyst.  She looks well.  At  this time, we will repeat her   hemoglobin and hematocrit and compare it to the previous one.  She has no   vaginal bleeding.  At this time, I do not think we need to repeat a pelvic   exam.  She is completely stable.      DIAGNOSTIC STUDIES:   The patient's hematocrit and hemoglobin are 36 and 12.2 compared to Saturday   even it was 40 and 13.6.      COURSE IN THE EMERGENCY DEPARTMENT:  The patient remained stable while in our care.  She did receive 1 Percocet   5/325 mg p.o. which did help to decrease her pain.    FINAL DIAGNOSIS:  Pelvic pain.    DISPOSITION AND PLAN:  Discharged home in stable condition.  She was advised to follow up with Dr.   Latanya Maudlin, call this afternoon to their office.  Given prescription for Percocet.    Counseled not to drive, drink alcohol, take extra Tylenol  or operate    machinery while taking pain medications.  Drink plenty of fluids, return   immediately if any worsening symptoms.  Stable at this time.  The patient was   personally evaluated by myself and Dr. Orma Flaming, who agrees with the   above assessment and plan.      ___________________  Posey Pronto MD  Dictated By: Guy Sandifer, PA-C    My signature above authenticates this document and my orders, the final  diagnosis (es), discharge prescription (s), and instructions in the [facility]   Pulsecheck record.  Nursing notes have been reviewed by the physician/mid-level provider.    If you have any questions please contact [phone].    JM  D:06/11/2013 13:27:29  T: 06/11/2013 19:00:17  [id number]  Authenticated by Posey Pronto, M.D. On 06/14/2013 03:18:11 PM

## 2013-06-19 NOTE — ED Provider Notes (Signed)
[facility]  EMERG[facility] TREATMENT REPORT  NAME:  [name], [name]  SEX:   F  ADMIT: 06/19/2013  DOB:   March 25, 1970  MR#    [id number]  ROOM:    TIME DICTATED: 07 11 PM  ACCT#  [id number]    cc: Sigmund HazelMichael Grimes M.D.    PRIMARY CARE PHYSICIAN:  Dr. Latanya MaudlinGrimes    CHIEF COMPLAINT:  Pelvic pain.    HISTORY OF PRESENT ILLNESS:  This is a 43 year old female with a history of pelvic pain due to ovarian cyst  who has had ongoing bilateral pelvic pain, mainly right-sided, for the past 10  days.  This is her third visit for it.  On 03/21 she had blood work done as  well as an ultrasound.  Ultrasound was read as bilateral complex ovarian cysts  likely representing hemorrhagic cysts with hemorrhagic free fluid in the  pelvis, 6 to 8 weeks follow up suggested.  She had an initial H&H of 15.9  and 47.6.  We monitored her repeat H&H and it was 13.6 and 40.  She returned  2 days later for continued pain.  She had taken her Percocet.  A repeat H&H  was done and it was 12.2 and 36.  She denies having had any vaginal bleeding  during this time.  She has had a hysterectomy.  She has been trying to get a  hold of Dr. Latanya MaudlinGrimes for reevaluation and additional pain medication, but has  not been able to secure an appointment.  She states her pain is not worse, it  is just constant.  Yesterday she developed nausea and vomiting.  Denies fevers  or urinary symptoms.    REVIEW OF SYSTEMS:  CONSTITUTIONAL:  No fever or chills.  RESPIRATORY:  No cough, shortness of breath, or wheezing.   CARDIOVASCULAR:  No chest pain, chest pressure, or palpitations.   GASTROINTESTINAL:  Nausea, vomiting.  No diarrhea.  GENITOURINARY:  No dysuria, frequency, or urgency.   MUSCULOSKELETAL:  No joint pain or swelling.   INTEGUMENTARY:  No rashes.    PAST MEDICAL HISTORY:  Hysterectomy, ovarian cyst.    SOCIAL HISTORY:  No tobacco or alcohol.    FAMILY HISTORY:  None.    ALLERGIES:  MULTIPLE AND REVIEWED IN IBEX.    MEDICATIONS:  None.     PHYSICAL EXAMINATION:  VITAL SIGNS:  Blood pressure is 109/69, pulse 78, respirations 18, temperature  98.4, oxygen 100% on room air.  GENERAL APPEARANCE:  Patient appears well developed and well nourished.  Appearance and behavior are age and situation appropriate.  The patient  appears uncomfortable in no acute distress.  RESPIRATORY:  Clear and equal breath sounds.  No respiratory distress,  tachypnea, or accessory muscle use.     CARDIOVASCULAR:   Heart regular, without murmurs, gallops, rubs, or thrills.   CHEST:  Chest symmetrical without masses or tenderness.   GASTROINTESTINAL:  Abdomen soft, nondistended, tender all across the lower  abdomen.  MUSCULOSKELETAL:  Stance and gait appear normal.  SKIN:  Warm and dry without rashes.  No flank tenderness.    INITIAL ASSESSMENT AND MANAGEMENT PLAN:  A 43 year old female with continued pelvic pain, now with nausea and vomiting.  We will obtain labs, CT abdomen and pelvis, check urine and treat  symptomatically.    DIAGNOSTIC STUDIES:  CT abdomen and pelvis was negative for acute abnormality.  Urine positive for  nitrites, 5 to 9 epithelial cells, 1 to 4 WBCs, occasional RBCs and 3+  bacteria.  CBC:  H&H is 12.7 and 39.4, improved from the previous visit.  BMP unremarkable.    COURSE:  The patient has remained stable throughout her ED stay.  She was given IV  fluids for hydration,  1 gram IV Rocephin for a UTI, a total of 2 mg IV  Dilaudid, 4 mg IV morphine for pain, 4 mg IV Zofran antiemetic.  She has had  no vomiting in the ED.  Pain is controlled.  I discussed the results of  testing with her.  Urine was sent for culture.  She is going to continue to  call Dr. Latanya Maudlin and was written prescriptions for Percocet and Keflex.    FINAL DIAGNOSES:  1.  Pelvic pain.  2.  Urinary tract infection.    DISPOSITION:  The patient is discharged home in stable condition.  The patient is discharged  home in stable condition, with instructions to follow up with their regular   doctor.  They are advised to return immediately for any worsening or symptoms  of concern.  The patient was personally evaluated by myself and Dr. Candis Shine who agrees with the above assessment and plan.      ___________________  Wynelle Bourgeois MD  Dictated By: Caprice Renshaw E. Mertie Moores, PA-C    My signature above authenticates this document and my orders, the final  diagnosis (es), discharge prescription (s), and instructions in the [facility]  Pulsecheck record.  Nursing notes have been reviewed by the physician/mid-level provider.    If you have any questions please contact [phone].    rm  D:06/19/2013 19:11:33  T: 06/19/2013 19:57:26  [id number]  Electronically Authenticated by:  Wynelle Bourgeois, MD On 07/10/2013 08:56 PM EDT

## 2013-07-13 NOTE — ED Provider Notes (Signed)
[facility]  EMERGENCY DEPARTMENT TREATMENT REPORT  NAME:  [name], [name]  SEX:   F  ADMIT: 07/13/2013  DOB:   1970-11-12  MR#    [id number]  ROOM:    TIME DICTATED: 10 43 AM  ACCT#  [id number]        TIME OF EVALUATION:  10:30.    PRIMARY CARE PROVIDER:   None.     CHIEF COMPLAINT:   Right lower quadrant abdominal pain, adnexal pain.      HISTORY OF PRESENT ILLNESS:   This is a 43 year old female who has been seen here numerous times in the past  month for pelvic pain, adnexal type pain with negative workup with respect to  ultrasound which showed some hemorrhagic cysts, negative CT scan. Tells me  that she has followup ultrasound scheduled with Dr. Latanya MaudlinGrimes on May 7 but she  began experiencing increased pelvic pain yesterday evening.  To the point now  where it is 10/10 constant, states it feels like contraction type of pain but  does not go away.  She had been on Percocet but ran out.  She has not been  taking anything else for this pain since it came on.  She tells me it is  exactly the same type of pain she had when she was here and evaluated for  ovarian cyst a few weeks ago but a bit worse today.  She denies any fever,  cough, chest pain, shortness of breath, nausea or vomiting or diarrhea.     REVIEW OF SYSTEMS:    CONSTITUTIONAL:  No fever, chills, or weight loss.   EYES:   No visual symptoms.   ENT:  No sore throat, runny nose, or other URI symptoms.   ENDOCRINE:  No diabetic symptoms.   HEMATOLOGIC/LYMPHATIC:   No excessive bruising or lymph node swelling.   ALLERGIC/IMMUNOLOGIC:  No urticaria or allergy symptoms.   RESPIRATORY:  No cough, shortness of breath, or wheezing.   CARDIOVASCULAR:  No chest pain, chest pressure, or palpitations.   GASTROINTESTINAL:  Positive for right lower quadrant abdominal pain.    GENITOURINARY:  No dysuria, frequency, or urgency.,   MUSCULOSKELETAL:  No joint pain or swelling.   INTEGUMENTARY:  No rashes.   NEUROLOGICAL:  No headaches, sensory or motor symptoms.       PAST MEDICAL HISTORY:   Ovarian cyst.  Chronic back pain. L3-L4 degenerative changes.  No pertinent  surgical history other than hysterectomy in 2013.      PSYCHIATRIC HISTORY:    Noncontributory.     FAMILY HISTORY:   Noncontributory.     SOCIAL HISTORY:   The patient does not smoke, drink alcohol and denies drug abuse.      ALLERGIES:   CODEINE. ERYTHROMYCIN.      MEDICATIONS:   None currently.      PHYSICAL EXAMINATION:   VITAL SIGNS:  Blood pressure: 115/71, pulse 88, respirations 16, temperature  98.1, sating 100% on room air.   CONSTITUTIONAL: Well-developed, well-nourished appearing 43 year old female  lying comfortably on the bed on her right side, kind of holding her right  lower quadrant abdomen with her right hand.  She does not appear in any acute  discomfort, however. She is cooperative with my evaluation.    EYES:  Conjunctivae clear, lids normal.  Pupils equal, symmetrical, and  normally reactive.   Mouth/Throat:  Surfaces of the pharynx, palate, and tongue are pink, moist,  and without lesions.   RESPIRATORY:  Clear and equal  breath sounds.  No respiratory distress,  tachypnea, or accessory muscle use.   CARDIOVASCULAR:   Heart regular, without murmurs, gallops, rubs, or thrills.   GI:  Abdomen is soft, she has acute tenderness to palpation in the right  adnexal area.  No organomegaly or mass, hematoma or rebound tenderness, no  guarding.    MUSCULOSKELETAL:  Nails:  No clubbing or deformities.  Nailbeds pink with  prompt capillary refill.   SKIN:  Warm and dry without rashes.   NEUROLOGIC: The patient is alert and oriented x3, able to move all extremities  upper and lower bilaterally without difficulty.      CONTINUATION BY JEFFREY PADGETT, PA-C     PHYSICAL EXAMINATION, CONT'D:  GENITOURINARY:   Female genitalia:  External genitalia without swelling,  lesions, or discharge.  Vaginal walls have no bulging or lesions.  Cervix is   pink with no lesions or discharge.  She has adnexal tenderness on the right  side but not on the left.  No mass appreciated.      IMPRESSION AND PLAN:   This is a 43 year old female who presents for evaluation of pelvic pain on the  right side.  She has had this several different times in the Emergency  Department and has been evaluated with ultrasound on the March 23 as well as a  CT scan on the 31st.  Her pain is entirely consistent she says with the pain  that she has had previously.  It is just a little bit worse today and she has  adnexal tenderness on the right side with physical examination.  We will  medicate her for her pain.  At this point, I do not feel it is necessary for  ultrasound.  I suspect that she has an ovarian torsion.  We will get a  urinalysis and medicate for her pain.      DIAGNOSTIC INTERPRETATIONS:   The results of the urinalysis are completely normal.      COURSE IN THE EMERGENCY DEPARTMENT:    The patient was given a total of 12 mg of IV morphine, 4 of Zofran, rehydrated  with 1 liter of normal saline, and 800 mg of ibuprofen.  She did feel better  after that medication.  The pain was not completely gone, but it was  tolerable.  She felt she was ready for discharge.      DIAGNOSIS:      Ovarian cyst.      DISPOSITION:    The patient remained completely stable during her course in the Emergency  Department.  She was discharged home in stable condition.  I did write her a  prescription for some Norco and ibuprofen, then had a discussion with the  patient regarding her repeated visits for this particular problem, although  she does tell me that she has an appointment again on May 7 with Ob-Gyn for  hopeful resolution to this so she does not have to return here.  I have  advised her to keep that appointment and return to the Emergency Department if  condition worsens or any new symptoms develop.  The patient was agreeable with  that plan.       The patient was seen and evaluated by myself and Dr. Dixie Dials, who  agrees with the above assessment and plan.      ___________________  Christiana Pellant MD  Dictated By: Fransisca Kaufmann, PA-C    My signature above authenticates this document and my orders, the  final  diagnosis (es), discharge prescription (s), and instructions in the [facility]  Pul[facility]ck record.  Nursing notes have been reviewed by the physician/mid-level provider.    If you have any questions please contact [phone].    FS  D:07/13/2013 10:43:01  T: 07/13/2013 15:35:08  [id number]  Electronically Authenticated by:  Carlis StableBen A. Carmela HurtFickenscher, M.D. On 07/14/2013 01:23 PM EDT

## 2013-08-07 NOTE — ED Provider Notes (Signed)
[facility]  EMERGENCY DEPARTMENT TREATMENT REPORT  NAME:  [name] [name]  SEX:   F  ADMIT: 08/06/2013  DOB:   Aug 03, 1970  MR#    [id number]  ROOM:    TIME DICTATED: 02 51 PM  ACCT#  [id number]        CHIEF COMPLAINT:  Pelvic pain.    HISTORY OF PRESENT ILLNESS:  This is a 43 year old female who comes in with a complaint of pelvic pain.  She states it has been constant and pretty prominent for the past few months.  She was seen here on 03/31, had a CT showing normal appendix, hysterectomy,  otherwise normal, 03/21 had had bilateral complex cysts suspected hemorrhagic  with positive free fluid.  She has had since several visits to this ER for  persistent pain.  She said that it is constant, feels like a contraction that  is constant, not really coming and going.  She was taking Motrin, and she had  a Percocet prescription that was written by [place], but she is out of that.  She  says it is both sides, right greater than left, feels entirely consistent with  pain that has been due to the ovarian cyst before.  It has been constant, like  I said, for the past month and a half but worse over the past 2 days.  No  vaginal bleeding.  She has had a hysterectomy.  No leakage of fluid, no  trauma, no fever, no vomiting or diarrhea.  No urinary symptoms.  No  discharge.  She was supposed to have seen Dr. Latanya Maudlin on 05/07, but she just  started a new job, and so she was not able to go but states now that she has  been at her job for 2 weeks she can followup.      REVIEW OF SYSTEMS:  CONSTITUTIONAL:  No fever, chills, or weight loss.  EYES:   No visual symptoms.  ENT:  No sore throat, runny nose, or other URI symptoms.  RESPIRATORY:  No cough, shortness of breath, or wheezing.  CARDIOVASCULAR:  No chest pain, chest pressure, or palpitations.  GASTROINTESTINAL:  As above.    GENITOURINARY:  As above.    INTEGUMENTARY:  No rash.  NEUROLOGIC:  No headache.    PAST MEDICAL HISTORY:  Ovarian cyst, chronic back pain.     PAST SURGICAL HISTORY:  Hysterectomy in 2013.    MEDICATIONS:   Reviewed in Ibex.      ALLERGIES:   REVIEWED IN IBEX.     SOCIAL HISTORY:  She does not smoke, drink alcohol.  Denies drug abuse.    PHYSICAL EXAMINATION:  GENERAL:  This is a 43 year old sitting comfortably on the stretcher in no  acute distress.  VITAL SIGNS:  Blood pressure 118/84, pulse 74, respiratory rate 18,  temperature 98.3, satting 99% on room air.    GENERAL APPEARANCE:  Patient appears well developed and well nourished.  Appearance and behavior are age and situation appropriate.  HEENT:  Eyes:  Conjunctivae clear, lids normal.  Pupils equal, symmetrical,  and normally reactive.  Mucous membranes are moist.    NECK:  Supple.  RESPIRATORY:  Clear and equal breath sounds.  No respiratory distress,  tachypnea, or accessory muscle use.    CARDIOVASCULAR:   Heart regular, without murmurs, gallops, rubs, or thrills.    GASTROINTESTINAL:  Abdomen is soft and nontender.  Pelvic exam done with Tory,  ACT, as a standby showing scant amount of nonodorous discharge  in the vault.  With bimanual exam, there is no pain over the midline of the pelvis, but the  patient does have bilateral adnexal pain, right greater than left.  There is  no rebound, no guarding.  The patient moves easily to change position, sit up  and lie down for the exam.   SKIN:  Warm and dry.  NEUROLOGIC:  A&O times 3, moving all extremities well.    PSYCHIATRIC:  Recent and remote memory appears to be intact.      IMPRESSION AND PLAN:  This is a 43 year old female here with bilateral pelvic pain that has been  somewhat persistent since March.  She has been evaluated several times, and  the last ultrasound was approximately 2 months ago.  She is supposed to follow  up with Dr. Latanya MaudlinGrimes.  We did discuss the fact that she is having some  right-sided pelvic pain, does not really seem to be abdominal, with no fever,  no vomiting, persistent pain, and she had a CT scan 03/31 after which the  pain  has never really gone away.  We have a low suspicion that this would be  appendicitis.  We will start with urine and ultrasound.  She will be medicated  with p.o. medications.     RESULTS:   Urine dip is negative.  Ultrasound pelvic read by Dr. Van Clinesaughdrille,  hysterectomy, questionable left hemorrhagic cyst measuring 2.3 x 2.2 x 1.4 cm,  trace amount of free fluid in the cul-de-sac.  Both ovaries showed normal  blood flow.    EMERGENCY ROOM COURSE:   The patient has remained entirely stable, was given p.o. Percocet and Motrin  and asked for repeat dose of Percocet stating she usually takes 2 at a time.  At this time, I have gone over her results with her and told her that we do  see a on the left where she did have some adnexal tenderness, but there was  really nothing to explain the pain on the right.  Again, we went over that I  think appendicitis is unlikely given the persistence of the pain, lack of  fever, vomiting and the fact that she says this is the exact same constant  pain she has had since March.  She understands that without doing further  imaging such as labs or CT, we cannot definitively rule this out, but she did  not have any suspicion it was her appendix.  She came in basically telling me  her cyst pain was increasing, so we did have this discussion, but based on her  presentation, exam and history, I do not feel that labs or CT is warranted at  this time.  She agrees to come back should any different pain, fever, vomiting  occur.  I did review her PMP.  She has not had any prescriptions in the month  of May.  The last one was prescribed by Harold HedgeJeff Padgett from this facility.  I  did discuss with her being that this is an ongoing problem, somewhat  persistent, and she is planning to follow up with a specialist, we are always  happy to see her back, but further prescriptions should come from Dr. Latanya MaudlinGrimes.  She understands.  She is going to be discharged in stable condition.     DIAGNOSES:    1.  Pelvic pain.   2.  Ovarian cyst.      Work note was also provided.      ___________________  Judeen HammansSara N  Tsuchitani MD  Dictated By: Marland Kitchen.     My signature above authenticates this document and my orders, the final  diagnosis (es), discharge prescription (s), and instructions in the [facility]  Pul[facility]ck record.  Nursing notes have been reviewed by the physician/mid-level provider.    If you have any questions please contact [phone].    LH  D:08/06/2013 14:51:24  T: 08/07/2013 08:44:13  [id number]  Electronically Authenticated by:  Tana ConchSARA N. TSUCHITANI, MD On 08/17/2013 02:23 PM EDT

## 2013-08-14 MED ORDER — HYDROMORPHONE (PF) 1 MG/ML IJ SOLN
1 mg/mL | INTRAMUSCULAR | Status: DC
Start: 2013-08-14 — End: 2013-08-14
  Administered 2013-08-14: 17:00:00 via INTRAMUSCULAR

## 2013-08-14 MED ORDER — METHOCARBAMOL 750 MG TAB
750 mg | ORAL_TABLET | Freq: Three times a day (TID) | ORAL | Status: DC
Start: 2013-08-14 — End: 2014-07-10

## 2013-08-14 MED ORDER — HYDROCODONE-ACETAMINOPHEN 5 MG-325 MG TAB
5-325 mg | ORAL_TABLET | ORAL | Status: DC | PRN
Start: 2013-08-14 — End: 2013-10-19

## 2013-08-14 MED ORDER — HYDROCODONE-ACETAMINOPHEN 5 MG-325 MG TAB
5-325 mg | ORAL | Status: AC
Start: 2013-08-14 — End: 2013-08-14
  Administered 2013-08-14: 18:00:00 via ORAL

## 2013-08-14 MED FILL — HYDROCODONE-ACETAMINOPHEN 5 MG-325 MG TAB: 5-325 mg | ORAL | Qty: 2

## 2013-08-14 NOTE — ED Provider Notes (Signed)
HPI Comments: 42yoF, with hx of sciatica, to ED c/o right sided low back pain x last night. Pt states she dropped the remote behind the couch and had to move the couch to get it and pulled her back. Took 800mg  Motrin last night and Tramadol in the middle of the night without relief. States had to call out of work "because it hurts to walk up the steps" and there is no elevator in their building. Pain does not radiate. Denies any saddle paresthesias, N/V/D, weakness, abd pain, urinary/bowel incontinence, or any other complaints.     Patient is a 43 y.o. female presenting with back pain. The history is provided by the patient.   Back Pain   Pertinent negatives include no fever.        Past Medical History   Diagnosis Date   ??? Adult ADHD    ??? Hypertension    ??? Anxiety    ??? Musculoskeletal disorder    ??? Chronic back pain         Past Surgical History   Procedure Laterality Date   ??? Hx orthopaedic           No family history on file.     History     Social History   ??? Marital Status: MARRIED     Spouse Name: N/A     Number of Children: N/A   ??? Years of Education: N/A     Occupational History   ??? Not on file.     Social History Main Topics   ??? Smoking status: Never Smoker    ??? Smokeless tobacco: Not on file   ??? Alcohol Use: Yes   ??? Drug Use: No   ??? Sexual Activity: Not on file     Other Topics Concern   ??? Not on file     Social History Narrative                  ALLERGIES: Codeine and Erythromycin      Review of Systems   Constitutional: Negative for fever and chills.   Respiratory: Negative.    Cardiovascular: Negative.    Genitourinary: Negative.    Musculoskeletal: Positive for back pain.   Neurological: Negative.        Filed Vitals:    08/14/13 1216   BP: 130/96   Pulse: 114   Temp: 98.2 ??F (36.8 ??C)   Resp: 20   Height: 5\' 6"  (1.676 m)   Weight: 90.719 kg (200 lb)   SpO2: 98%            Physical Exam   Constitutional: She appears well-developed and well-nourished. No distress.   HENT:   Head: Normocephalic and  atraumatic.   Musculoskeletal:        Lumbar back: She exhibits decreased range of motion, tenderness and pain. She exhibits no bony tenderness, no swelling, no edema, no deformity, no laceration and no spasm.        Back:    Skin: She is not diaphoretic.        MDM  Number of Diagnoses or Management Options  Diagnosis management comments: Lumbar strain, muscle spasm, sciatica. Rx for pain meds, PCP f/u. Joanna Hews, PA 12:51 PM          Procedures

## 2013-08-14 NOTE — ED Notes (Signed)
Patient armband removed and shredded I have reviewed discharge instructions with the patient.  The patient verbalized understanding. Patient is ambulatory with NAD noted and VSS.  Patient is being discharged with two prescriptions at this time.

## 2013-08-14 NOTE — ED Notes (Signed)
I pulled my back out last hs R/T moving furniture

## 2013-09-25 NOTE — ED Provider Notes (Signed)
[facility]  EMERGENCY DEPARTMENT TREATMENT REPORT  NAME:  [name], [name]  SEX:   F  ADMIT: 09/23/2013  DOB:   Sep 28, 1970  MR#    [id number]  ROOM:    TIME DICTATED: 11 43 PM  ACCT#  [id number]        TIME OF EVALUATION:  1338    CHIEF COMPLAINT:   Anxiety and hypertension.    HISTORY OF PRESENT ILLNESS:  A 43 year old female who presents requesting refills of her anxiety and blood  pressure medications.  She takes clonidine 0.1 mg once daily and Klonopin 1 mg  once daily.  She ran out of her medications 3 days ago and is scheduled to  follow up with her psychiatrist, Dr. Deboraha SprangWilliam Yetter, who refills her  medications on Wednesday.  She does feel slightly anxious now and has been  having panic attacks on and off for the past 2 days.  She feels shaky with  sweaty palms and very nervous.  This is exactly like panic attacks she has  been having and that occur when she does not have her medication.  She would  like a dose of her anxiety medication now as well as refills to last her until  her followup appointment.    REVIEW OF SYSTEMS:  CONSTITUTIONAL:  As above.  No fever.  RESPIRATORY:  No shortness of breath.  CARDIOVASCULAR:  No chest pain or chest pressure.  GASTROINTESTINAL:  No vomiting, diarrhea or abdominal pain.   MUSCULOSKELETAL:  No lower extremity swelling.  NEUROLOGIC:  No headaches, lightheadedness, dizziness or unilateral weakness.    PAST MEDICAL HISTORY:  Hypertension, chronic back pain, L3 through L4 degenerative changes,  hysterectomy in 2013 and anxiety with panic attacks.    SOCIAL HISTORY:  No tobacco, alcohol or recreational drug use.    ALLERGIES:  ERYTHROMYCIN AND CODEINE SULFATE.      MEDICATIONS:  Clonidine and Klonopin.    PHYSICAL EXAMINATION:  VITAL SIGNS:  BP 136/89, pulse 76, respirations 20, temperature 97.9 oral,  pain 0, O2 saturation 100% on room air.  GENERAL APPEARANCE:  The patient appears slightly anxious in the room air,  well developed, well nourished.   HEENT:  Eyes:  Conjunctivae clear, lids normal.  Pupils equal, symmetrical and  normally reactive.  Mouth/Throat:  Surfaces of the pharynx, palate and tongue  are pink, moist and without lesions.   RESPIRATORY:  Clear and equal breath sounds.  No respiratory distress,  tachypnea or accessory muscle use.   CARDIOVASCULAR:   Heart regular, without murmurs, gallops, rubs or thrills.     ASSESSMENT AND MANAGEMENT PLAN:  The patient presents complaining of anxiety.  She has a history of the same.  She did run out of her medications a few days ago.  We will go ahead and treat  with a dose of her Klonopin and will discharge with refills of her Klonopin  and clonidine to last until she can see her MD at her scheduled appointment  this Wednesday.  Her blood pressure is well controlled here.  Plan discussed  with the patient who is very happy.    COURSE IN THE EMERGENCY DEPARTMENT:  The patient medicated with Klonopin 1 mg p.o.    FINAL DIAGNOSES:  1.  Anxiety.  2.  Medication refill.  3.  History of hypertension.    DISPOSITION AND PLAN:  The patient discharged home in stable condition with verbal and written  instructions for ongoing care.  She is to follow  up with Dr. Carlena SaxYetter as  scheduled this Wednesday for additional refills of her medications.  She is  aware she may return at any time for new or worsening symptoms.  The patient  was personally evaluated by myself and Dr. Carmela HurtFickenscher who agrees with the  above assessment and plan.      ___________________  Christiana PellantBen A Khamron Gellert MD  Dictated By: Verlin Grillsolleen Oleary, PA    My signature above authenticates this document and my orders, the final  diagnosis (es), discharge prescription (s), and instructions in the [facility]  Pul[facility]ck record.  Nursing notes have been reviewed by the physician/mid-level provider.    If you have any questions please contact [phone].    SC  D:09/24/2013 23:43:45  T: 09/25/2013 10:13:12  [id number]  Electronically Authenticated by:   Carlis StableBen A. Carmela HurtFickenscher, M.D. On 10/01/2013 07:10 AM EDT

## 2013-10-15 NOTE — ED Provider Notes (Signed)
[facility]  EMERGENCY DEPARTMENT TREATMENT REPORT  NAME:  [name], [name]  SEX:   F  ADMIT: 10/15/2013  DOB:   26-Jan-1971  MR#    [id number]  ROOM:    TIME DICTATED: 03 54 PM  ACCT#  [id number]        PRIMARY CARE PHYSICIAN:  Unknown.    CHIEF COMPLAINT:  Pelvic pain.    HISTORY OF PRESENT ILLNESS:  A 43 year old female who comes in with a complaint of pelvic pain that began  last night.  She describes it as being similar to her previous history of  ovarian cyst.  It as sharp, unrelenting.  She tried taking Motrin, a heating  pad, did not improve.  Her symptoms began last night.  She thinks she has a  urinary tract infection.  She has had some pressure with urination over the  last 2 days and a foul odor.  She was brought over in the ambulance with her  husband who is now in the ER as well with her sister-in-law who are also being  seen.  The patient denies any fever.  She says she had an episode of vomiting  this morning.  She had a normal bowel movement yesterday.  She had a partial  hysterectomy a few years ago.  She denies any other alleviating or aggravating  factors associated with her condition.    REVIEW OF SYSTEMS:  CONSTITUTIONAL:  No fever, chills or weight loss.   EYES:  No visual symptoms.   ENT:  No sore throat, runny nose or other URI symptoms.   RESPIRATORY:  No cough, shortness of breath or wheezing.    CARDIOVASCULAR:  No chest pain, chest pressure or palpitations.    GASTROINTESTINAL:  One episode of vomiting, abdominal pain that she describes  as being on both sides of her pelvis, but not in the middle.  GENITOURINARY:  Urinary pressure and frequency.  MUSCULOSKELETAL:  No joint pain or swelling.    INTEGUMENTARY:  No rashes.   NEUROLOGICAL:  No headaches, sensory or motor symptoms.        PAST MEDICAL HISTORY:  Ovarian cysts, chronic back pain, hysterectomy, anxiety.    SOCIAL HISTORY:  Denies alcohol, tobacco and drug use.    FAMILY HISTORY:  Noncontributory.    CURRENT MEDICATIONS:   None.    ALLERGIES:  CODEINE SULFATE, ERYTHROMYCIN.    PHYSICAL EXAMINATION:  VITAL SIGNS:  Blood pressure 143/107, pulse 93, respirations 18, temperature  is 97.7, pain is 10 out of 10, O2 saturations 100% on room air.  GENERAL APPEARANCE:  Patient appears well developed and well nourished.  Appearance and behavior are age and situation appropriate.    HEENT:  Eyes:  Conjunctivae clear, lids normal.  Pupils equal, symmetrical,  and normally reactive.  Mouth/Throat:  Surfaces of the pharynx, palate and  tongue are pink, moist and without lesions.   NECK:  Supple, nontender, symmetrical, no masses or JVD, trachea midline,  thyroid not enlarged, nodular or tender.    LYMPHATICS:  No cervical or submandibular lymphadenopathy palpated.   RESPIRATORY:  Clear and equal breath sounds.  No respiratory distress,  tachypnea or accessory muscle use.   CARDIOVASCULAR:  Heart regular, without murmurs, gallops, rubs or thrills.   CHEST:  Chest symmetrical without masses or tenderness.     GASTROINTESTINAL:  The abdomen is soft, tender to palpation over the right and  left lower quadrants.  There is no rebound tenderness.    GENITALIA:  The external genitalia are without swelling, lesions or discharge.  Vaginal walls have no bulging or lesions.  The cervix was absent.  The  cervical cuff was intact.  The patient had bilateral severe adnexal pain to  palpation.  MUSCULOSKELETAL:  Stance and gait appear normal.   SKIN:  Warm and dry without rashes.       INITIAL ASSESSMENT AND MANAGEMENT PLAN:  A 43 year old female who comes in with a complaint of severe pelvic pain  reminiscent of her previous ovarian cyst.  The patient was seen here in May of  this year, diagnosed with a hemorrhagic cyst.  She is also having urinary  complaints.  We will obtain an ultrasound to rule out the potential for  torsion or recurrence of a hemorrhagic cyst.  We will check urine for  infection.  We will medicate her with oral Percocet and ibuprofen and   reevaluate.  She otherwise looks well.  She is afebrile.  She is quite   anxious.    CONTINUED BY NICHOLE RICE, PA-C:    DIAGNOSTIC STUDIES RESULTS:  Ultrasound formal transvaginal, impression:  1) Left ovarian cyst measuring  1.9 x 1.5 x 1.5,  2) hysterectomy. Both ovaries show normal blood flow.  Adnexal regions are normal. No fluid seen in the cul-de-sac.  Urinalysis is  negative.    CLINICAL COURSE:  During the patient's stay in the Emergency Department.  She did not develop  any new or worsening symptoms.  She was medicated with ibuprofen as well as 2  Percocets.  CT imaging was discussed with the patient but was felt not to be  needed at this time.  The patient has had this pain pretty much everyday since  March.  It has not changed in characteristic.  It just gets more or less  intense each day.  She has not had a fever.  She is having normal bowel  movements.  She denies any urinary symptoms.  The patient agreed that she did  not feel that she needed a CAT scan, that this was her usual pain, but she was  also counseled to certainly return immediately if she develops fever,  worsening pain,  change in her pain or any other concerns she may have.  Life  coach did evaluate the patient to help her with follow up as patient has been  seen here multiple times for similar symptoms.    CLINICAL IMPRESSION AND  DIAGNOSIS:  Acute on chronic pelvic pain.     DISPOSITION AND PLAN:  The patient is discharged home in stable condition with discharge instructions  on the same.  She is to follow up with the resources as given to her by the  life coach. Return to the ER if condition worsens or new symptoms develop.  Encourage fluids.  Do not drive or operate machinery while medicated and a  prescription for ibuprofen, Zofran, and Norco were given.  The patient was  personally evaluated by myself and Dr. Hervey Ard who agrees with the above  assessment and plan.     CONTINUATION BY Sy Saintjean TSUCHITANI, MD:      I also examined the patient myself.  She is complaining of bilateral pelvic  pain, right greater than left, which she says has been pretty constant since  March.  She said she pretty much has had pain every single day at least since  March, probably since before, and some days are better and some days or worse.  She denies a  fever.  Her pain really is low in her pelvic area, not up in her  abdomen.  She has previously had a CT scan here before, but I told her that  with her lack of fever and the constant pain, I would have a low suspicion  today that appendicitis is causing the pain.  She complains of painful  intercourse.  She said when she wiped last night, she saw a teeny amount of  blood but has had no leakage of fluid, or vaginal bleeding.  Otherwise, she   says  that she has painful intercourse but has actually not had intercourse in some  time due to the pain.  Her hysterectomy was done in 2004.  I also put a  speculum into the vaginal vault and did a pelvic exam.  I did not see any sign   of any dehiscence or blood or fluid or  really anything in the vault that was unremarkable.  Again her pain is pelvic  in nature and has been somewhat constant.  She has been unable to follow up  with Dr. Latanya Maudlin as she has been advised to do previously.  We have had the  life coach see her to see if we can help arrange some followup, and we will  write her a prescription for medication at home.  We did do the ultrasound,  since she has had a cyst before, but I told her that, at this point, I do not  think that a CT of her abdomen would be diagnostic.  I specifically talked  about that with her, and she agreed, and she said she felt the same way.  She  feels that this is the same thing that has been going on for some time.      ___________________  Judeen Hammans Tsuchitani MD  Dictated By: Dayton Scrape Rice, PA-C    My signature above authenticates this document and my orders, the final   diagnosis (es), discharge prescription (s), and instructions in the [facility]  Pul[facility]heck record.  Nursing notes have been reviewed by the physician/mid-level provider.    If you have any questions please contact [phone].    [facility]  D:10/15/2013 15:54:16  T: 10/15/2013 22:51:10  [id number]  Electronically Authenticated and Edited by:  Tana Conch, MD On 11/02/2013 07:34 PM EDT

## 2013-10-19 LAB — METABOLIC PANEL, COMPREHENSIVE
A-G Ratio: 1.1 (ref 0.8–1.7)
ALT (SGPT): 25 U/L (ref 13–56)
AST (SGOT): 7 U/L — ABNORMAL LOW (ref 15–37)
Albumin: 3.2 g/dL — ABNORMAL LOW (ref 3.4–5.0)
Alk. phosphatase: 57 U/L (ref 45–117)
Anion gap: 4 mmol/L (ref 3.0–18)
BUN/Creatinine ratio: 10 — ABNORMAL LOW (ref 12–20)
BUN: 9 MG/DL (ref 7.0–18)
Bilirubin, total: 0.2 MG/DL (ref 0.2–1.0)
CO2: 29 mmol/L (ref 21–32)
Calcium: 8.2 MG/DL — ABNORMAL LOW (ref 8.5–10.1)
Chloride: 109 mmol/L — ABNORMAL HIGH (ref 100–108)
Creatinine: 0.9 MG/DL (ref 0.6–1.3)
GFR est AA: >60 mL/min/{1.73_m2} (ref >60)
GFR est non-AA: >60 mL/min/{1.73_m2} (ref >60)
Globulin: 2.8 g/dL (ref 2.0–4.0)
Glucose: 79 mg/dL (ref 74–99)
Potassium: 4.1 mmol/L (ref 3.5–5.5)
Protein, total: 6 g/dL — ABNORMAL LOW (ref 6.4–8.2)
Sodium: 142 mmol/L (ref 136–145)

## 2013-10-19 LAB — CBC WITH AUTOMATED DIFF
ABS. BASOPHILS: 0 10*3/uL (ref 0.0–0.1)
ABS. EOSINOPHILS: 0.2 10*3/uL (ref 0.0–0.4)
ABS. LYMPHOCYTES: 1.9 10*3/uL (ref 0.9–3.6)
ABS. MONOCYTES: 0.7 10*3/uL (ref 0.05–1.2)
ABS. NEUTROPHILS: 3.8 10*3/uL (ref 1.8–8.0)
BASOPHILS: 0 % (ref 0–2)
EOSINOPHILS: 3 % (ref 0–5)
HCT: 37.5 % (ref 35.0–45.0)
HGB: 12.5 g/dL (ref 12.0–16.0)
LYMPHOCYTES: 29 % (ref 21–52)
MCH: 29.5 PG (ref 24.0–34.0)
MCHC: 33.3 g/dL (ref 31.0–37.0)
MCV: 88.4 FL (ref 74.0–97.0)
MONOCYTES: 11 % — ABNORMAL HIGH (ref 3–10)
MPV: 10.7 FL (ref 9.2–11.8)
NEUTROPHILS: 57 % (ref 40–73)
PLATELET: 238 10*3/uL (ref 135–420)
RBC: 4.24 M/uL (ref 4.20–5.30)
RDW: 13.5 % (ref 11.6–14.5)
WBC: 6.6 10*3/uL (ref 4.6–13.2)

## 2013-10-19 LAB — URINALYSIS W/ RFLX MICROSCOPIC
Bilirubin: NEGATIVE
Blood: NEGATIVE
Glucose: NEGATIVE mg/dL
Ketone: NEGATIVE mg/dL
Leukocyte Esterase: NEGATIVE
Nitrites: POSITIVE — AB
Protein: NEGATIVE mg/dL
Specific gravity: 1.015 (ref 1.003–1.030)
Urobilinogen: 0.2 EU/dL (ref 0.2–1.0)
pH (UA): 6 (ref 5.0–8.0)

## 2013-10-19 LAB — URINE MICROSCOPIC ONLY
RBC: NEGATIVE /hpf (ref 0–5)
WBC: 0 /hpf (ref 0–4)

## 2013-10-19 LAB — HCG URINE, QL: HCG urine, QL: NEGATIVE

## 2013-10-19 MED ORDER — ONDANSETRON 4 MG TAB, RAPID DISSOLVE
4 mg | ORAL_TABLET | ORAL | Status: DC
Start: 2013-10-19 — End: 2014-07-10

## 2013-10-19 MED ORDER — MORPHINE 2 MG/ML INJECTION
2 mg/mL | Freq: Once | INTRAMUSCULAR | Status: AC
Start: 2013-10-19 — End: 2013-10-19
  Administered 2013-10-19: 19:00:00 via INTRAVENOUS

## 2013-10-19 MED ORDER — HYDROCODONE-ACETAMINOPHEN 5 MG-325 MG TAB
5-325 mg | ORAL_TABLET | ORAL | Status: DC | PRN
Start: 2013-10-19 — End: 2014-07-10

## 2013-10-19 MED ORDER — CEPHALEXIN 500 MG CAP
500 mg | ORAL_CAPSULE | Freq: Two times a day (BID) | ORAL | Status: AC
Start: 2013-10-19 — End: 2013-10-26

## 2013-10-19 MED ORDER — ONDANSETRON (PF) 4 MG/2 ML INJECTION
4 mg/2 mL | INTRAMUSCULAR | Status: AC
Start: 2013-10-19 — End: 2013-10-19
  Administered 2013-10-19: 18:00:00 via INTRAVENOUS

## 2013-10-19 MED ORDER — MORPHINE 2 MG/ML INJECTION
2 mg/mL | Freq: Once | INTRAMUSCULAR | Status: AC
Start: 2013-10-19 — End: 2013-10-19
  Administered 2013-10-19: 18:00:00 via INTRAVENOUS

## 2013-10-19 MED ADMIN — ondansetron (ZOFRAN) injection 4 mg: INTRAVENOUS | @ 19:00:00 | NDC 55150012502

## 2013-10-19 MED FILL — ONDANSETRON HCL 4 MG/2 ML INTRAVENOUS SYRINGE: 4 mg/2 mL | INTRAVENOUS | Qty: 2

## 2013-10-19 MED FILL — MORPHINE 2 MG/ML INJECTION: 2 mg/mL | INTRAMUSCULAR | Qty: 2

## 2013-10-19 NOTE — ED Provider Notes (Signed)
HPI Comments: 43yoF, with hx of hysterectomy and ovarian cyst, to ED c/o left pelvic pain x 2 days. Pain worse with movement and intercourse. Nothing alleviates the pain. Pt states pain started suddenly at rest. States pain is similar to when she had "large fibroids before the hysterectomy." No fever, chills, back pain, urinary complaints, discharge. Pain is constant.    Patient is a 43 y.o. female presenting with abdominal pain and nausea. The history is provided by the patient.   Abdominal Pain   Associated symptoms include nausea. Pertinent negatives include no fever, no diarrhea, no vomiting, no constipation and no dysuria.   Nausea   Associated symptoms include abdominal pain. Pertinent negatives include no chills, no fever and no diarrhea.        Past Medical History   Diagnosis Date   ??? Adult ADHD    ??? Hypertension    ??? Anxiety    ??? Musculoskeletal disorder    ??? Chronic back pain         Past Surgical History   Procedure Laterality Date   ??? Hx orthopaedic           No family history on file.     History     Social History   ??? Marital Status: MARRIED     Spouse Name: N/A     Number of Children: N/A   ??? Years of Education: N/A     Occupational History   ??? Not on file.     Social History Main Topics   ??? Smoking status: Never Smoker    ??? Smokeless tobacco: Not on file   ??? Alcohol Use: Yes   ??? Drug Use: No   ??? Sexual Activity: Not on file     Other Topics Concern   ??? Not on file     Social History Narrative                  ALLERGIES: Codeine and Erythromycin      Review of Systems   Constitutional: Negative for fever and chills.   HENT: Negative.    Respiratory: Negative.    Cardiovascular: Negative.    Gastrointestinal: Positive for nausea and abdominal pain. Negative for vomiting, diarrhea, constipation and blood in stool.   Genitourinary: Positive for pelvic pain and dyspareunia. Negative for dysuria and flank pain.   Musculoskeletal: Negative.    Skin: Negative.    Neurological: Negative.     All other systems reviewed and are negative.      Filed Vitals:    10/19/13 1234   BP: 120/83   Pulse: 93   Temp: 98.7 ??F (37.1 ??C)   Resp: 16   Height: 5\' 6"  (1.676 m)   Weight: 90.719 kg (200 lb)   SpO2: 99%            Physical Exam   Constitutional: She is oriented to person, place, and time. She appears well-developed and well-nourished. No distress.   HENT:   Head: Normocephalic and atraumatic.   Right Ear: External ear normal.   Left Ear: External ear normal.   Nose: Nose normal.   Mouth/Throat: Oropharynx is clear and moist. No oropharyngeal exudate.   Eyes: Conjunctivae are normal.   Neck: Normal range of motion. Neck supple.   Cardiovascular: Normal rate and regular rhythm.    Pulmonary/Chest: Effort normal and breath sounds normal.   Abdominal: Bowel sounds are normal. She exhibits no mass. There is tenderness. There is no rebound and  no guarding.       Musculoskeletal: Normal range of motion.   Lymphadenopathy:     She has no cervical adenopathy.   Neurological: She is alert and oriented to person, place, and time.   Skin: Skin is warm and dry. She is not diaphoretic.   Psychiatric: She has a normal mood and affect.        MDM  Number of Diagnoses or Management Options  Diagnosis management comments: US neg for torsion. Urine likely contaminated, but will culture. Rx for nausea, pain meds. GYN f/u. Pt is afebrile, VSS. Joanna HewsLIYA  Terica Yogi, PA 2:58 PM           Amount and/or Complexity of Data Reviewed  Clinical lab tests: ordered and reviewed  Tests in the radiology section of CPT??: ordered and reviewed    Risk of Complications, Morbidity, and/or Mortality  Presenting problems: moderate  Diagnostic procedures: moderate  Management options: low    Patient Progress  Patient progress: stable      Procedures

## 2013-10-19 NOTE — ED Notes (Signed)
Pt discharged home stable and ambulatory.   Pain level at discharge  .   Pt discharged with friend/family.   Reviewed discharged instructions with  rx and verbalized understanding.   Patient armband removed and shredded

## 2013-10-19 NOTE — ED Notes (Signed)
Pt. C/o   left lower abdominal   Pain for 3 days, nausea,  Hx , ovarian cyst, Partial Hysterectomy

## 2013-10-19 NOTE — ED Notes (Signed)
Pt to US with transport

## 2013-10-19 NOTE — ED Notes (Signed)
I performed a brief evaluation, including history and physical, of the patient here in triage and I have determined that pt will need further treatment and evaluation from the main side ER physician.  I have placed initial orders to help in expediting patients care.     October 19, 2013 at 12:36 PM - Gifford ShaveELIZABETH M Natasja Niday, PA        Visit Vitals   Item Reading   ??? BP 120/83 mmHg   ??? Pulse 93   ??? Temp 98.7 ??F (37.1 ??C)   ??? Resp 16   ??? Ht 5\' 6"  (1.676 m)   ??? Wt 90.719 kg (200 lb)   ??? BMI 32.30 kg/m2   ??? SpO2 99%

## 2013-10-21 LAB — CULTURE, URINE: Culture result:: >100000

## 2013-10-28 NOTE — ED Provider Notes (Signed)
[facility]  EMERGENCY DEPARTMENT TREATMENT REPORT  NAME:  [name], [name]  SEX:   F  ADMIT: 10/28/2013  DOB:   09/17/1970  MR#    [id number]  ROOM:    TIME DICTATED: 04 10 PM  ACCT#  [id number]        TIME OF EVALUATION:  12 p.m.    CHIEF COMPLAINT:  Abdominal pain.    HISTORY OF PRESENT ILLNESS:  The patient is a 43 year old female with history of frequent ovarian cysts  coming in today for evaluation stating that she had a history of ovarian cyst  and last night while cooking dinner, she started having pain in the right  adnexa radiating straight through to her back.  She says she thought she  noticed when she went to urinate there was some pink on the toilet paper.  She  thought she could be spotting.  She does not know if that was spotting or a  little bit of blood in her urine but has not noticed it today.  She has pain  daily in the pelvic area but it is obviously increased today.  She had a  history of prior hysterectomy, but still has both ovaries.  She had  accompanying nausea, no vomiting or diarrhea, no fevers, pain has persisted  into today.  Her last bowel movement was last night and it was normal per her.  She has not had any dysuria or irritative voiding symptoms of any kind.    REVIEW OF SYSTEMS:  CONSTITUTIONAL:  No fevers.  ENDOCRINE:  No diabetic symptoms.   RESPIRATORY:  No shortness breath.  CARDIOVASCULAR:  No chest pain.  GASTROINTESTINAL:  She has right adnexal pain, no abdominal pain or vomiting,  no diarrhea.  She has had nausea here and there.  GU:  No abnormal discharge unless last night was spotting.  It could have been  urinary as well.  No dysuria or frequency.  MUSCULOSKELETAL:  No joint pain or swelling.  INTEGUMENTARY:  No rashes.  NEUROLOGIC:  No headache.    PAST MEDICAL HISTORY:  Ovarian cyst, hypertension, chronic back pain, hysterectomy, anxiety.    FAMILY HISTORY:  Noncontributory.    SOCIAL HISTORY:  No alcohol, tobacco or drug use.    ALLERGIES:  REVIEWED IN IBEX.     MEDICATIONS:  Reviewed in Ibex.    PHYSICAL EXAMINATION:  VITAL SIGNS:  Blood pressure 112/71, pulse 84, respirations 22, temperature  98.1, sats 100% on room air.  GENERAL APPEARANCE:  Patient appears well developed and well nourished.  Appearance and behavior are age and situation appropriate.   ENT:  Oral mucosa appropriately moist.  RESPIRATORY:  Clear and equal breath sounds.  No respiratory distress,  tachypnea, or accessory muscle use.     CARDIOVASCULAR:   Heart regular, without murmurs, gallops, rubs, or thrills.       GI:  Abdomen soft.  She is not particularly tender in the right lower quadrant  but says that it feels sore because it is close to the right adnexa. No other  abdominal tenderness at all appreciated on examination.    GU:  External genitalia without swelling, lesions or discharge.  Vaginal walls  have no bulging or lesions.  She does not have a cervix as it has been  surgically removed.  She has no evidence of lacerations or vaginal bleeding  whatsoever.  I do not see any blood in the vaginal vault.  I spun the speculum  around and do  not see any lacerations or signs of bleeding or even where she  could have bleeding over the past 12 hours.  She has had no pain on bimanual  examination with palpation over the suprapubic and left adnexal areas but she  is tender in the right adnexa, very much so on examination.  No abnormal  adnexal masses appreciated.  MUSCULOSKELETAL:  Stance and gait appear normal.    SKIN:  Warm and dry.    INITIAL ASSESSMENT AND MANAGEMENT PLAN:   This is a 43 year old female coming in for evaluation of right adnexal pain,  history of ovarian cysts that is most likely outcome is that she has had an  ovarian cyst bothering her since last night.  We will check a urine and  ultrasound with Dopplers to make sure she does not have a torsion.  If that is  negative, we might have to do a CT scan to rule out acute appendicitis since  her pain is on the right side.     CONTINUATION BY SHANNON WALLACE, PA-C     Urinalysis positive for nitrites, 10 to 14 epithelials, 2+ bacteria.  Transvaginal ultrasound showed left ovary 1.9 x 0.8 x 1.7 cm with some  follicles.  Color flow duplex intact to the left ovary.  Right ovary was not  visualized.  She has evidence of hysterectomy all read by Dr. Jolayne Panther of  radiology.  Dr. Delton See spoke with Dr. Allena Katz via phone who said that the right  ovary is just too small to see.  The likelihood of a torsion of that would be  extremely unlikely.  There were no cysts identified there.  Her CBC  unremarkable.  Complete metabolic panel unremarkable other than a chloride of  108, sugar of 72, calcium 8.1.  CT of abdomen and pelvis ordered to rule out  appendicitis in a patient with what looked like right adnexal pain just to  make sure she was not having an appendicitis that was masking itself as pelvic  pain.  It showed appendix that was normal, evidence of hysterectomy read by  Dr. Allena Katz, radiology.    COURSE IN THE EMERGENCY DEPARTMENT:  The patient was initially given p.o. Percocet for her pain and ODT Zofran.  She was then given 6 mg of IM morphine, 4 mg of Zofran.  A repeat dose of 4 mg  of morphine. At the end of her stay, she asked for p.o. medication and was  given p.o. Norco.    DIAGNOSES:  1.  Abdominal pain, right lower quadrant.  2.  Nausea.  3.  Urinary tract infection.     DISPOSITION:  She is discharged in stable condition with a script for Norco, Zofran,  Naprosyn and Keflex, to follow up with PCP, Dr. Shea Evans, over at the  transitional care clinic.  I think she says she has an appointment on Thursday  with OB/GYN, Dr. Dareen Piano.  Return with any worsening concerns.  She was given  abdominal pain, nausea and UTI instructions.    The patient was personally evaluated by myself and Dr. Delton See who agrees with  the above assessment and plan.      ___________________  Gwenyth Allegra MD  Dictated By: Bettey Mare. Earlene Plater, PA-C     My signature above authenticates this document and my orders, the final  diagnosis (es), discharge prescription (s), and instructions in the [facility]  Pul[facility]heck record.  Nursing notes have been reviewed by the physician/mid-level provider.    If  you have any questions please contact [phone].    [place]  D:10/28/2013 16:10:44  T: 10/28/2013 23:14:39  [id number]  Electronically Authenticated by:  Gwenyth Allegraimothy S. Jakeb Lamping, M.D. On 10/31/2013 01:14 AM EDT

## 2013-10-31 NOTE — H&P (Signed)
[facility]  CONSULT[facility]  NAME:  [name], [name]  SEX:   F  ADMIT: 10/31/2013  DATE OF CONSULT: 10/31/2013  REFERRING PHYSICIAN:    DOB: 1970-05-28  MR#    [id number]  ROOM:    ACCT#  [id number]        CHIEF COMPLAINT:  This is the first transitional care clinic appointment [facility]/2013 for this 43 year old white female for right pelvic pain.  She had a normal ultrasound at that time and a CAT scan showed a normal  appendix.  She was diagnosed with UTI, prescribed Norco,  Zofran, Naprosyn and  Keflex.    HISTORY OF PRESENT ILLNESS:  The patient states she never filled her Keflex.  She had some old Keflex left  over from her previous UTI and has been taking that.  She did take a dose this  morning and she has been taking Naprosyn.  She continues to have the pelvic  pain, it is slightly better.  She is out of her Norco.  She describes it as   low in her pelvis and bilateral next to her bladder.  She has almost constant   pelvic pain that she describes as crampy.  She went to see an OB/GYN once, Dr.   Latanya MaudlinGrimes, but she never followed up because she did not have insurance and it   was too costly. She also has a history of anxiety.  She is on the waiting list   at [facility].  She was prescribed in the past Adderall, Xanax, Ambien and Effexor   for this.  She was previously physically seen by Dr. Carlena SaxYetter but can no longer   afford to see him due to finances.  She does not feel like her anxiety is a   problem at this time.  Of note, she has been seen 10 times in the emergency   room in the last year, mostly for pelvic pain.  Multiple ultrasounds have been   done and several CAT scans all being negative.  She does have a history of   complex ovarian cysts, chronic back pain, anxiety.  She had a hysterectomy in   2014 secondary to fibroids.    PSYCHIATRIC HISTORY:  As above.  Also, she attempted suicide at age 43.  She has no current suicidal   ideation and has never had any thoughts of hurting herself since that time.  Phq9 and GAD-7 screening were done, both were zero.     PERSONAL AND SOCIAL HISTORY:  She is married.  She is currently unemployed.  She has had 2 years of college.  She lives with her husband and they have 2 grown children.  Her husband is  also unemployed and attempting to get disability.  Tobacco:  Denies.  Alcohol:  Denies.  Drugs:  Denies.    FAMILY HISTORY:  Father died of colon cancer at age 41.  Mother has \\heart problems.\\  She is an only child.    REVIEW OF SYSTEMS:  GENERAL:  She has never had a mammogram.  LUNGS:  Denies cough or wheeze.  CARDIOVASCULAR:  Denies chest pain.  GASTROINTESTINAL:  Denies nausea, vomiting, constipation or diarrhea.  URINARY:  Denies dysuria at this time.  GENITAL:  Chronic pelvic pain as described.  PSYCHIATRIC:  Denies depression or anxiety at this time.    PHYSICAL EXAMINATION:  APPEARANCE:  Well-developed, well-nourished white female accompanied by her  husband.  VITAL SIGNS:  Temperature  99.7, heart rate 102, respiratory rate 18, blood  pressure 127/91, O2 saturation 97%, weight 203 pounds, 5 feet 6 inches.  LUNGS:  Clear.  CARDIOVASCULAR:  S1 and S2.  Regular.  ABDOMEN:  Soft, round, nondistended.  Tenderness right lower quadrant and left  lower quadrant next to her bladder.  No rebound.  NEUROLOGIC:  Mental status:  Alert, relaxed, cooperative, thought process  coherent, moderate medical literacy.    ASSESSMENT AND PLAN:  1.  Chronic pelvic pain.  Instructed the patient to fill her Keflex and take  for the full 10 days.  Continue the Naprosyn p.r.n.  Continue heat or cold for  the pain.  Differential diagnosis includes chronic interstitial cystitis and  adhesions, current pain may be partially caused by her UTI.  The plan is to  treat her UTI and then get another pain assessment.  We will consider  amitriptyline and possibly venlafaxine at that time for pain.  She was in   agreement.   2.  History of anxiety.  3.  Follow up in this clinic in 1 week.    4.  Refer to [facility].      ___________________  Wendee Copp NP  Dictated By:.   [facility]  D:10/31/2013 14:35:05  T: 10/31/2013 15:03:29  [id number]  Electronically Authenticated and Edited by:  Wendee Copp, NP On 10/31/2013 03:45 PM EDT

## 2013-10-31 NOTE — ED Provider Notes (Signed)
[facility]  EMERGENCY DEPARTMENT TREATMENT REPORT  NAME:  [name], [name]  SEX:   F  ADMIT: 10/31/2013  DOB:   20-Jan-1971  MR#    [id number]  ROOM:    TIME DICTATED: 05 34 PM  ACCT#  [id number]    cc: [facility]     PRIMARY CARE PHYSICIAN:  [facility]    EVALUATION TIME:  1525.    CHIEF COMPLAINT:  Pelvic pain.     HISTORY OF PRESENT ILLNESS:  This is a 43 year old female who says she has been having problems with  chronic pelvic pain since March as a new problem just this year. It has been  kind of constant dull discomfort with intermittent bouts of sharp pain that  come and go.  She has been seen here multiple times and has been told in the  past that she has had some cysts that maybe the cause of pain.  They have also  told that she could have endometriosis that is undiagnosed or even adhesions  as she had a partial hysterectomy year and a half ago.  She has been unable to  follow up with GYN because of lack of insurance.  She was here 5 days ago and  had a negative ultrasound and negative CT scan, normal blood work, was treated  for UTI with nitrite positive urine.  She was sent to the [facility] and went there today for an appointment having persisting pain.  They  advised that they were unable to treat this in anyway and that they  recommended she come back to the emergency room.  She has not had fevers,  chills, vomiting.  The pain has not really changed since her visit here 5 days  ago, it has just persisted.    REVIEW OF SYSTEMS:  CONSTITUTIONAL:  She denies fevers or chills.  EYES:  No visual symptoms.  ENT:  No sore throat, runny nose, or other URI symptoms.  RESPIRATORY: No cough, shortness of breath, or wheezing.   CARDIOVASCULAR:  No chest pain, chest pressure, or palpitations.  GASTROINTESTINAL:  Pelvic pain.   GENITOURINARY:  Pelvic pain.  Denies UTI symptoms.  MUSCULOSKELETAL:  No joint pain or swelling.    NEUROLOGICAL:  No headaches, sensory or motor symptoms.    PAST MEDICAL HISTORY:  Chronic back pain.  She has had a partial hysterectomy, ovarian cyst.    SOCIAL HISTORY:  The patient does not smoke or drink.    FAMILY HISTORY:  Not known.    ALLERGIES:  CODEINE AND ERYTHROMYCIN.     MEDICATIONS:  Listed and reviewed in [place].    PHYSICAL EXAMINATION:  GENERAL APPEARANCE:  Patient appears well developed and well nourished.  Appearance and behavior are age and situation appropriate.  VITAL SIGNS:  Blood pressure is 131/90, heart rate is 97, respiratory 16,  temperature 98.8, 9 out of 10 discomfort, 100% on room air.  HEENT:  Eyes: Pupils equal, symmetrical and normally reactive. Mouth/Throat:  Surfaces of the pharynx, palate, and tongue are pink, moist, and without  lesions.  RESPIRATORY: Clear and equal breath sounds.  No respiratory distress,  tachypnea, or accessory muscle use.  CARDIOVASCULAR:  Heart regular without murmurs, gallops, rubs or thrills.  GASTROINTESTINAL:  Abdomen soft, nontender, without complaint of pain to  palpation. No hepatomegaly or splenomegaly.  MUSCULOSKELETAL: Nails:  No clubbing or deformities.  Nailbeds pink  with  prompt capillary refill.  NEUROLOGIC: Alert, oriented. Sensation intact,  motor strength equal and  symmetric. Recent and remote memory appear to be intact.  Mildly anxious  appearing.    ASSESSMENT AND MANAGEMENT PLAN:  A patient here with a history of chronic pelvic pain since 05/2013.  She has  had multiple negative ultrasound here some showing cysts.  She had a negative  ultrasound and CT scan just 5 days ago with normal blood work. At this time I  am going to re-dip a urine to ensure she does not have any persistent UTI and  will change her medication to Percocet and Phenergan for better pain control  and nausea control.  The patient has been seen by the life coaches and they  are going to have her in to [facility] within the next 2 weeks for   followup with OB/GYN. I have discussed with the patient  I see no need with  this being a persistent chronic problem to do further workup and she is in  agreement.     DIAGNOSTIC INTERPRETATION:  The patient's urine did dip positive for nitrites and leukocyte esterase, 15  to 29 epithelials, 1 to 4 white 4+ bacteria. This is sent for culture as she  is currently on Keflex with no UTI symptoms.      COURSE IN THE EMERGENCY DEPARTMENT:   The patient was given IM morphine and Phenergan.  Again, the life coaches saw  her and she has paper work to take the free clinic. They have advised that  will probably be able to get her an appointment within the next couple of days  with GYN. At this point I am going to switch her Percocet and Phenergan and  advised that she keep those followup appointments for more definitive care and  treatment.    DIAGNOSES:  1. Pelvic pain.   2.  Urinary tract infection.    DISPOSITION:  Home.    The patient was personally evaluated by myself and Dr. Wilfrid LundVanden Hoek who agrees  with the above assessment and plan.     The patient is discharged with verbal and written instructions and a referral  for ongoing care. The patient is aware that they may return at any time for  new or worsening symptoms.      ___________________  Erlinda Hongodd Vanden Hoek MD  Dictated By: Marlana SalvageErin Icenbice, PA    My signature above authenticates this document and my orders, the final  diagnosis (es), discharge prescription (s), and instructions in the [facility]  Pul[facility]ck record.  Nursing notes have been reviewed by the physician/mid-level provider.    If you have any questions please contact [phone].    [facility]  D:10/31/2013 17:34:45  T: 10/31/2013 19:51:17  [id number]  Electronically Authenticated by:  Tikita Mabee L. Wilfrid LundVanden Hoek, M.D. On 11/02/2013 01:14 PM EDT

## 2013-11-06 NOTE — ED Provider Notes (Signed)
[facility]  EMERGENCY DEPARTMENT TREATMENT REPORT  NAME:  [name], [name]  SEX:   F  ADMIT: 11/06/2013  DOB:   08-14-1970  MR#    [id number]  ROOM:    TIME DICTATED: 03 36 PM  ACCT#  [id number]        TIME OF EVALUATION:  1520    PRIMARY CARE PROVIDER:  Unknown.    CHIEF COMPLAINT:  Abdominal pain, painful urination.    HISTORY OF PRESENT ILLNESS:  A 43 year old female presenting to the Emergency Department today secondary to  dysuria, frequency and urgency type symptoms.  The patient has been to our  Emergency Department 2 times earlier this month with similar symptoms.  She  has completed a course of Keflex secondary to urinary tract infection and has  been on pain medication.  She did run out of her Percocet yesterday and since  that time, her pain has gotten worse, is described as crampy to her low  abdomen and low back.  Today she started having increased nausea and has  actually vomited one time.  Denies any fever.  Due to worsening symptoms, she  is here for further evaluation.    REVIEW OF SYSTEMS:  CONSTITUTIONAL:  No fever.  EYES:  No visual symptoms.   ENT:  No sore throat, runny nose or other URI symptoms.    RESPIRATORY:  No shortness of breath.  CARDIOVASCULAR:  No chest pain.  GASTROINTESTINAL:  Positive for abdominal pain and vomiting, no diarrhea.  GENITOURINARY:  Positive for dysuria, frequency and urgency.  MUSCULOSKELETAL:  Reports low back pain.  INTEGUMENTARY:  No rashes.  NEUROLOGIC:  No headache.    PAST MEDICAL HISTORY:  History of partial hysterectomy.  The patient tells me that she has had  somewhat chronic pelvic pain over the past year and a half since a  hysterectomy performed.  She does have history of ovarian cysts, history of  hypertension, chronic back pain, anxiety.    SOCIAL HISTORY:  Nonsmoker.    FAMILY HISTORY:  Denies any history of female cancers.    MEDICATIONS:  None listed at this time.    ALLERGIES:  REVIEWED.    PHYSICAL EXAMINATION:   VITAL SIGNS:  Blood pressure 133/83, pulse 99, respirations 16, temperature  97.9, pain 10 out of 10, O2 saturation 100% on room air.  GENERAL:  The patient well nourished, well developed.  She is dry heaving when  I enter the room,  uncomfortable secondary to pain.  HEENT:   Eyes:  Conjunctivae clear, lids normal.  Pupils equal, symmetrical,  and normally reactive.  ENT:  Mouth:  Mucous membranes somewhat dry.  RESPIRATORY:  Clear and equal breath sounds.  No respiratory distress,  tachypnea or accessory muscle use.   CARDIOVASCULAR:  Heart regular, without murmurs, gallops, rubs or thrills.    GASTROINTESTINAL:  Normoactive bowel sounds.  Abdomen is soft, somewhat  diffusely tender to the lower abdomen but greatest in the suprapubic region.  No rebound or guarding is appreciated.  MUSCULOSKELETAL:  No localized midline tenderness to the back, no tenderness  over costovertebral angles bilaterally.  The patient's pain paravertebrally at  the low lumbar region.  Moving all extremities spontaneously.  SKIN:  No visualized rashes.  NEUROLOGIC:  Alert, oriented.  Sensation intact, motor strength equal and  symmetric.      CONTINUATION BY Kristeen MansBRITTANY IRWIN, PA:     INITIAL ASSESSMENT AND MANAGEMENT PLAN:   This is a 43 year old female.  This is  her third visit to the ED with  complaints of pelvic pain.  She has been worked up with a CT and ultrasounds  already which were unremarkable.  She is complaining of urinary symptoms.  We  will obtain urine and reassess laboratory data to assure no acute abnormality.    DIAGNOSTIC INTERPRETATIONS:  Urine revealed trace leukocytes, positive nitrites, however, seem to be  contaminated.  There were greater epithelial cells than white cells.  We will  go ahead and send for culture based on her symptoms.  CBC revealed a normal  white count 9.8 with 68% segs, hemoglobin and hematocrit of 12.4 and 37.2  otherwise unremarkable.  BMP revealed a chloride of 110, otherwise   unremarkable.  Urine culture pending.    ER COURSE:  The patient stable while here in the Emergency Department, initially medicated  for pain with IV Benadryl and IV Phenergan.  There were notes that the patient  did not have ID with her.  She was also given a liter of IV fluids for  hydration.  The patient did complain of pain numerous times and was given IV  Toradol.  She did finally show me a copy of her driver's license and was given  IV morphine for symptomatic relief.  Again, the patient had unremarkable  radiology studies earlier this month, and her pain is similar.  Differential  actually includes interstitial cystitis or other chronic pelvic pain issues.  The patient has an appointment with the [facility] in the near  future.  She is advised to keep this appointment as directed.  At this time,  do not feel comfortable prescribing her narcotic medications even though the  patient does wish to receive these.  It sounds like this is more of a chronic  issue at this time.  Did go ahead and write her for oral Ultram and Naprosyn  and advised her to keep her appointment as above.  Although the patient is  somewhat dissatisfied with not receiving narcotic pain medications, I do feel  that she is stable to go home with instructions to return for any new or  worsening symptoms, vomiting, fever, etc.  .  Again, urine culture is pending.    CLINICAL IMPRESSION AND DIAGNOSIS:  Pelvic pain.    DISPOSITION AND PLAN:  The patient discharged home.  Prescriptions and instructions above.  The  patient was personally evaluated by myself and Dr. Delton See who agrees with the  above assessment and plan.      ___________________  Gwenyth Allegra MD  Dictated By: Kristeen Mans, PA    My signature above authenticates this document and my orders, the final  diagnosis (es), discharge prescription (s), and instructions in the [facility]  Pul[facility]ck record.  Nursing notes have been reviewed by the physician/mid-level provider.     If you have any questions please contact [phone].    [facility]  D:11/06/2013 15:36:40  T: 11/06/2013 18:05:07  [id number]  Electronically Authenticated by:  Gwenyth Allegra, M.D. On 11/27/2013 01:10 PM EDT

## 2014-04-26 ENCOUNTER — Emergency Department (HOSPITAL_COMMUNITY): Payer: Self-pay

## 2014-04-26 ENCOUNTER — Encounter (HOSPITAL_COMMUNITY): Payer: Self-pay | Admitting: Nurse Practitioner

## 2014-04-26 ENCOUNTER — Emergency Department (HOSPITAL_COMMUNITY)
Admission: EM | Admit: 2014-04-26 | Discharge: 2014-04-26 | Disposition: A | Discharge status: Home / Self Care | Payer: Self-pay | Attending: Emergency Medicine | Admitting: Emergency Medicine

## 2014-04-26 DIAGNOSIS — Z90710 Acquired absence of both cervix and uterus: Secondary | ICD-10-CM | POA: Insufficient documentation

## 2014-04-26 DIAGNOSIS — R102 Pelvic and perineal pain: Secondary | ICD-10-CM | POA: Insufficient documentation

## 2014-04-26 LAB — COMPREHENSIVE METABOLIC PANEL
ALT: 21 U/L (ref 0–35)
AST: 17 U/L (ref 0–37)
Albumin: 4.4 g/dL (ref 3.5–5.2)
Alkaline Phosphatase: 71 U/L (ref 39–117)
Anion gap: 4 — ABNORMAL LOW (ref 5–15)
BUN: 10 mg/dL (ref 6–23)
CO2: 28 mmol/L (ref 19–32)
Calcium: 9.7 mg/dL (ref 8.4–10.5)
Chloride: 104 mmol/L (ref 96–112)
Creatinine, Ser: 0.65 mg/dL (ref 0.50–1.10)
GFR calc Af Amer: >90 mL/min (ref >90)
GFR calc non Af Amer: >90 mL/min (ref >90)
Glucose, Bld: 94 mg/dL (ref 70–99)
Potassium: 3.9 mmol/L (ref 3.5–5.1)
Sodium: 136 mmol/L (ref 135–145)
Total Bilirubin: 0.7 mg/dL (ref 0.3–1.2)
Total Protein: 6.9 g/dL (ref 6.0–8.3)

## 2014-04-26 LAB — CBC WITH DIFFERENTIAL/PLATELET
Basophils Absolute: 0 10*3/uL (ref 0.0–0.1)
Basophils Relative: 0 % (ref 0–1)
Eosinophils Absolute: 0.1 10*3/uL (ref 0.0–0.7)
Eosinophils Relative: 1 % (ref 0–5)
HCT: 40.5 % (ref 36.0–46.0)
Hemoglobin: 13.8 g/dL (ref 12.0–15.0)
Lymphocytes Relative: 30 % (ref 12–46)
Lymphs Abs: 2.1 10*3/uL (ref 0.7–4.0)
MCH: 29.9 pg (ref 26.0–34.0)
MCHC: 34.1 g/dL (ref 30.0–36.0)
MCV: 87.9 fL (ref 78.0–100.0)
Monocytes Absolute: 0.6 10*3/uL (ref 0.1–1.0)
Monocytes Relative: 8 % (ref 3–12)
Neutro Abs: 4.3 10*3/uL (ref 1.7–7.7)
Neutrophils Relative %: 61 % (ref 43–77)
Platelets: 314 10*3/uL (ref 150–400)
RBC: 4.61 MIL/uL (ref 3.87–5.11)
RDW: 12.8 % (ref 11.5–15.5)
WBC: 7.1 10*3/uL (ref 4.0–10.5)

## 2014-04-26 LAB — URINALYSIS, ROUTINE W REFLEX MICROSCOPIC
Bilirubin Urine: NEGATIVE
Glucose, UA: NEGATIVE mg/dL
Hgb urine dipstick: NEGATIVE
Ketones, ur: NEGATIVE mg/dL
Leukocytes, UA: NEGATIVE
Nitrite: NEGATIVE
Protein, ur: NEGATIVE mg/dL
Specific Gravity, Urine: 1.021 (ref 1.005–1.030)
Urobilinogen, UA: 0.2 mg/dL (ref 0.0–1.0)
pH: 6 (ref 5.0–8.0)

## 2014-04-26 LAB — WET PREP, GENITAL
Clue Cells Wet Prep HPF POC: NONE SEEN
Trich, Wet Prep: NONE SEEN
WBC, Wet Prep HPF POC: NONE SEEN
Yeast Wet Prep HPF POC: NONE SEEN

## 2014-04-26 MED ORDER — PROMETHAZINE HCL 25 MG PO TABS: 25.0000 mg | ORAL_TABLET | Freq: Three times a day (TID) | ORAL | Status: AC | PRN

## 2014-04-26 MED ORDER — OXYCODONE-ACETAMINOPHEN 5-325 MG PO TABS: 1.0000 | ORAL_TABLET | Freq: Four times a day (QID) | ORAL | Status: AC | PRN

## 2014-04-26 MED ORDER — SODIUM CHLORIDE 0.9 % IV BOLUS (SEPSIS)
1000.0000 mL | Freq: Once | INTRAVENOUS | Status: AC
  Administered 2014-04-26: 1000 mL via INTRAVENOUS

## 2014-04-26 MED ORDER — IOHEXOL 300 MG/ML  SOLN
80.0000 mL | Freq: Once | INTRAMUSCULAR | Status: AC | PRN
  Administered 2014-04-26: 80 mL via INTRAVENOUS

## 2014-04-26 MED ORDER — MORPHINE SULFATE 4 MG/ML IJ SOLN
4.0000 mg | Freq: Once | INTRAMUSCULAR | Status: AC
  Administered 2014-04-26: 4 mg via INTRAVENOUS
  Filled 2014-04-26: qty 1

## 2014-04-26 MED ORDER — IOHEXOL 300 MG/ML  SOLN
25.0000 mL | Freq: Once | INTRAMUSCULAR | Status: AC | PRN
  Administered 2014-04-26: 25 mL via ORAL

## 2014-04-26 MED ORDER — IBUPROFEN 800 MG PO TABS: 800.0000 mg | ORAL_TABLET | Freq: Three times a day (TID) | ORAL | Status: AC | PRN

## 2014-04-26 MED ORDER — KETOROLAC TROMETHAMINE 30 MG/ML IJ SOLN
30.0000 mg | Freq: Once | INTRAMUSCULAR | Status: AC
Start: 2014-04-26 — End: 2014-04-26
  Administered 2014-04-26: 30 mg via INTRAVENOUS
  Filled 2014-04-26: qty 1

## 2014-04-26 MED ORDER — ONDANSETRON HCL 4 MG/2ML IJ SOLN
4.0000 mg | Freq: Once | INTRAMUSCULAR | Status: AC
  Administered 2014-04-26: 4 mg via INTRAVENOUS
  Filled 2014-04-26: qty 2

## 2014-04-26 MED ORDER — MORPHINE SULFATE 4 MG/ML IJ SOLN
6.0000 mg | Freq: Once | INTRAMUSCULAR | Status: AC
  Administered 2014-04-26: 6 mg via INTRAVENOUS
  Filled 2014-04-26: qty 2

## 2014-04-26 MED ORDER — HYDROMORPHONE HCL 1 MG/ML IJ SOLN
1.0000 mg | Freq: Once | INTRAMUSCULAR | Status: AC
  Administered 2014-04-26: 1 mg via INTRAVENOUS
  Filled 2014-04-26: qty 1

## 2014-04-26 NOTE — Discharge Instructions (Signed)
Return here as needed.  Your testing here today did not show any abnormalities.  Follow-up with the GYN appointment

## 2014-04-26 NOTE — ED Notes (Signed)
Pt. Not in room at this time.

## 2014-04-26 NOTE — ED Provider Notes (Signed)
CSN: [id number]     Arrival date & time 04/26/14  0951 History   First MD Initiated Contact with Patient 04/26/14 [phone]     Chief Complaint  Patient presents with  . Pelvic Pain     (Consider location/radiation/quality/duration/timing/severity/associated sxs/prior Treatment) HPI Patient presents to the emergency department with pelvic pain that started 2 weeks ago.  The patient states she was seen in the emergency department for this and they did a CT scan and blood tests and did not find any abnormalities.  The patient states the pain has continued, but has not gotten worse.  Patient denies vomiting, but does have some nausea.  He should states that palpation makes the pain worse.  The patient states that nothing seems make her condition better.  She did say the oxycodone helped with her pain.  Patient denies chest pain, shortness of breath, headache, blurred vision, weakness, dizziness, numbness, back pain, dysuria, incontinence, vaginal discharge, vaginal bleeding, cough, runny nose, sore throat, or syncope History reviewed. No pertinent past medical history. Past Surgical History  Procedure Laterality Date  . Vaginal wall repair  2015  . Abdominal hysterectomy     No family history on file. History  Substance Use Topics  . Smoking status: Never Smoker   . Smokeless tobacco: Not on file  . Alcohol Use: No   OB History    No data available     Review of Systems  All other systems negative except as documented in the HPI. All pertinent positives and negatives as reviewed in the HPI.   Allergies  Review of patient's allergies indicates no known allergies.  Home Medications   Prior to Admission medications   Medication Sig Start Date End Date Taking? Authorizing Provider  acetaminophen (TYLENOL) 325 MG tablet Take 650 mg by mouth every 6 (six) hours as needed.   Yes Historical Provider, MD  ibuprofen (ADVIL,MOTRIN) 600 MG tablet Take 600 mg by mouth every 6 (six) hours as  needed for moderate pain.   Yes Historical Provider, MD  oxycodone (OXY-IR) 5 MG capsule Take 5 mg by mouth every 4 (four) hours as needed.   Yes Historical Provider, MD   BP 122/72 mmHg  Pulse 65  Temp(Src) 97.6 F (36.4 C) (Oral)  Resp 18  SpO2 99% Physical Exam  Constitutional: She is oriented to person, place, and time. She appears well-developed and well-nourished. No distress.  HENT:  Head: Normocephalic and atraumatic.  Mouth/Throat: Oropharynx is clear and moist.  Eyes: Pupils are equal, round, and reactive to light.  Neck: Normal range of motion. Neck supple.  Cardiovascular: Normal rate and regular rhythm.  Exam reveals no gallop and no friction rub.   No murmur heard. Pulmonary/Chest: Effort normal and breath sounds normal. No respiratory distress.  Abdominal: Soft. Bowel sounds are normal. She exhibits no distension. There is tenderness. There is no guarding.  Genitourinary: Cervix exhibits no motion tenderness, no discharge and no friability. Right adnexum displays tenderness. Right adnexum displays no mass and no fullness. Left adnexum displays no mass, no tenderness and no fullness.  Neurological: She is alert and oriented to person, place, and time. She exhibits normal muscle tone. Coordination normal.  Skin: Skin is warm and dry. No rash noted. No erythema.  Nursing note and vitals reviewed.   ED Course  Procedures (including critical care time) Labs Review Labs Reviewed  COMPREHENSIVE METABOLIC PANEL - Abnormal; Notable for the following:    Anion gap 4 (*)    All other  components within normal limits  URINALYSIS, ROUTINE W REFLEX MICROSCOPIC - Abnormal; Notable for the following:    APPearance HAZY (*)    All other components within normal limits  WET PREP, GENITAL  CBC WITH DIFFERENTIAL/PLATELET  GC/CHLAMYDIA PROBE AMP ([place])    Imaging Review US Transvaginal Non-ob  04/26/2014   CLINICAL DATA:  Right pelvic pain for 2 weeks.  EXAM: TRANSABDOMINAL  AND TRANSVAGINAL ULTRASOUND OF PELVIS  DOPPLER ULTRASOUND OF OVARIES  TECHNIQUE: Both transabdominal and transvaginal ultrasound examinations of the pelvis were performed. Transabdominal technique was performed for global imaging of the pelvis including uterus, ovaries, adnexal regions, and pelvic cul-de-sac.  It was necessary to proceed with endovaginal exam following the transabdominal exam to visualize the endometrium and ovaries. Color and duplex Doppler ultrasound was utilized to evaluate blood flow to the ovaries.  COMPARISON:  None.  FINDINGS: Uterus  Surgically absent.  Right ovary  Measurements: 2.2 x 1.6 x 1.6 cm. Normal appearance/no adnexal mass.  Left ovary  Measurements: 2 x 1.4 x 1.4 cm. Normal appearance/no adnexal mass.  Pulsed Doppler evaluation of both ovaries demonstrates normal low-resistance arterial and venous waveforms. Evaluation of the right ovary was complicated by adjacent bowel peristalsis.  Other findings  No free fluid.  IMPRESSION: 1. No ovarian torsion. 2. Prior hysterectomy.   Electronically Signed   By: Elige Ko   On: 04/26/2014 12:08   US Pelvis Complete  04/26/2014   CLINICAL DATA:  Right pelvic pain for 2 weeks.  EXAM: TRANSABDOMINAL AND TRANSVAGINAL ULTRASOUND OF PELVIS  DOPPLER ULTRASOUND OF OVARIES  TECHNIQUE: Both transabdominal and transvaginal ultrasound examinations of the pelvis were performed. Transabdominal technique was performed for global imaging of the pelvis including uterus, ovaries, adnexal regions, and pelvic cul-de-sac.  It was necessary to proceed with endovaginal exam following the transabdominal exam to visualize the endometrium and ovaries. Color and duplex Doppler ultrasound was utilized to evaluate blood flow to the ovaries.  COMPARISON:  None.  FINDINGS: Uterus  Surgically absent.  Right ovary  Measurements: 2.2 x 1.6 x 1.6 cm. Normal appearance/no adnexal mass.  Left ovary  Measurements: 2 x 1.4 x 1.4 cm. Normal appearance/no adnexal mass.   Pulsed Doppler evaluation of both ovaries demonstrates normal low-resistance arterial and venous waveforms. Evaluation of the right ovary was complicated by adjacent bowel peristalsis.  Other findings  No free fluid.  IMPRESSION: 1. No ovarian torsion. 2. Prior hysterectomy.   Electronically Signed   By: Elige Ko   On: 04/26/2014 12:08   Ct Abdomen Pelvis W Contrast  04/26/2014   CLINICAL DATA:  Pelvic/ lower abdominal pain x3 weeks, status post hysterectomy 2014, with vaginal wall repair in 2015  EXAM: CT ABDOMEN AND PELVIS WITH CONTRAST  TECHNIQUE: Multidetector CT imaging of the abdomen and pelvis was performed using the standard protocol following bolus administration of intravenous contrast.  CONTRAST:  80mL OMNIPAQUE IOHEXOL 300 MG/ML  SOLN  COMPARISON:  None.  FINDINGS: Lower chest:  Lung bases are clear.  Trace right pelvic fluid.  Hepatobiliary: Liver is within normal limits.  Gallbladder is unremarkable. No intrahepatic or extrahepatic ductal dilatation.  Pancreas: Within normal limits.  Spleen: Mildly lobular contour.  Adrenals/Urinary Tract: Adrenal glands are unremarkable.  Kidneys are within normal limits.  No hydronephrosis.  Bladder is within normal limits  Stomach/Bowel: Stomach is unremarkable.  No evidence of bowel obstruction.  Normal appendix.  Vascular/Lymphatic: No evidence of abdominal aortic aneurysm.  No suspicious abdominopelvic lymphadenopathy.  Reproductive: Status post  hysterectomy.  Bilateral ovaries are within normal limits.  Other: No abdominopelvic ascites.  Musculoskeletal: Degenerative changes at the L5-S1.  IMPRESSION: No evidence of bowel obstruction.  Normal appendix.  Status post hysterectomy.  No CT findings to account for the patient's abdominal pain.   Electronically Signed   By: Charline BillsSriyesh  Krishnan M.D.   On: 04/26/2014 15:14   [place] Art/ven Flow Abd Pelv Doppler  04/26/2014   CLINICAL DATA:  Right pelvic pain for 2 weeks.  EXAM: TRANSABDOMINAL AND TRANSVAGINAL  ULTRASOUND OF PELVIS  DOPPLER ULTRASOUND OF OVARIES  TECHNIQUE: Both transabdominal and transvaginal ultrasound examinations of the pelvis were performed. Transabdominal technique was performed for global imaging of the pelvis including uterus, ovaries, adnexal regions, and pelvic cul-de-sac.  It was necessary to proceed with endovaginal exam following the transabdominal exam to visualize the endometrium and ovaries. Color and duplex Doppler ultrasound was utilized to evaluate blood flow to the ovaries.  COMPARISON:  None.  FINDINGS: Uterus  Surgically absent.  Right ovary  Measurements: 2.2 x 1.6 x 1.6 cm. Normal appearance/no adnexal mass.  Left ovary  Measurements: 2 x 1.4 x 1.4 cm. Normal appearance/no adnexal mass.  Pulsed Doppler evaluation of both ovaries demonstrates normal low-resistance arterial and venous waveforms. Evaluation of the right ovary was complicated by adjacent bowel peristalsis.  Other findings  No free fluid.  IMPRESSION: 1. No ovarian torsion. 2. Prior hysterectomy.   Electronically Signed   By: Elige KoHetal  Patel   On: 04/26/2014 12:08   Return here as needed.  The patient has a GYN appointment and have advised her to follow-up with that.  Told to return here as needed.  Patient has had another CT scan was negative and ultrasound.     MDM   Final diagnoses:  Pelvic pain in female        Carlyle DollyChristopher W Fortune Brannigan, PA-C 04/26/14 1532  Jamesetta Orleanshristopher W BoerneLawyer, PA-C 04/26/14 1534  Hilario Quarryanielle S Ray, MD 04/29/14 [phone]

## 2014-04-26 NOTE — ED Notes (Signed)
PA student at bedside.

## 2014-04-26 NOTE — ED Notes (Signed)
Pt c/o pelvic pain and lower abdominal pain x3 weeks. sts was evaluated by ER and told she had adhesions and to follow up with GYN. Pt has appointment with GYN on Thursday but ran out of oxycodone 5mg  3 days ago and pain is 8/10. Patient endorses lower abdominal pain, worse on the right side, tender to palpation. Patient denies vaginal bleeding or discharge. Patient endorses vaginal wall very painful and unable to have sex. Patient has had hysterectomy in 2014 and vaginal wall repair 2015. Patient has taken tylenol 650 mg and motrin 600 mg alternatively every 6 hours and uses heating pack with mild-moderate relief.

## 2014-04-29 LAB — GC/CHLAMYDIA PROBE AMP (~~LOC~~) NOT AT ARMC
Chlamydia: NEGATIVE
Neisseria Gonorrhea: NEGATIVE

## 2014-06-17 ENCOUNTER — Encounter (HOSPITAL_COMMUNITY): Payer: Self-pay | Admitting: Emergency Medicine

## 2014-06-17 ENCOUNTER — Emergency Department (HOSPITAL_COMMUNITY)
Admission: EM | Admit: 2014-06-17 | Discharge: 2014-06-17 | Disposition: A | Discharge status: Home / Self Care | Payer: Self-pay | Attending: Emergency Medicine | Admitting: Emergency Medicine

## 2014-06-17 DIAGNOSIS — Z79899 Other long term (current) drug therapy: Secondary | ICD-10-CM | POA: Insufficient documentation

## 2014-06-17 DIAGNOSIS — Z3202 Encounter for pregnancy test, result negative: Secondary | ICD-10-CM | POA: Insufficient documentation

## 2014-06-17 DIAGNOSIS — R1030 Lower abdominal pain, unspecified: Secondary | ICD-10-CM | POA: Insufficient documentation

## 2014-06-17 DIAGNOSIS — R1031 Right lower quadrant pain: Secondary | ICD-10-CM

## 2014-06-17 DIAGNOSIS — Z90710 Acquired absence of both cervix and uterus: Secondary | ICD-10-CM | POA: Insufficient documentation

## 2014-06-17 LAB — URINALYSIS, ROUTINE W REFLEX MICROSCOPIC
Bilirubin Urine: NEGATIVE
Glucose, UA: NEGATIVE mg/dL
HGB URINE DIPSTICK: NEGATIVE
Ketones, ur: NEGATIVE mg/dL
Leukocytes, UA: NEGATIVE
Nitrite: NEGATIVE
PH: 6.5 (ref 5.0–8.0)
Protein, ur: NEGATIVE mg/dL
Specific Gravity, Urine: 1.014 (ref 1.005–1.030)
Urobilinogen, UA: 0.2 mg/dL (ref 0.0–1.0)

## 2014-06-17 LAB — COMPREHENSIVE METABOLIC PANEL
ALK PHOS: 77 U/L (ref 39–117)
ALT: 34 U/L (ref 0–35)
ANION GAP: 6 (ref 5–15)
AST: 24 U/L (ref 0–37)
Albumin: 3.9 g/dL (ref 3.5–5.2)
BILIRUBIN TOTAL: 0.7 mg/dL (ref 0.3–1.2)
CHLORIDE: 108 mmol/L (ref 96–112)
CO2: 25 mmol/L (ref 19–32)
Calcium: 9.6 mg/dL (ref 8.4–10.5)
Creatinine, Ser: 0.68 mg/dL (ref 0.50–1.10)
Glucose, Bld: 101 mg/dL — ABNORMAL HIGH (ref 70–99)
Potassium: 4.2 mmol/L (ref 3.5–5.1)
Sodium: 139 mmol/L (ref 135–145)
Total Protein: 6.8 g/dL (ref 6.0–8.3)

## 2014-06-17 LAB — CBC WITH DIFFERENTIAL/PLATELET
BASOS ABS: 0 10*3/uL (ref 0.0–0.1)
Basophils Relative: 0 % (ref 0–1)
EOS ABS: 0.1 10*3/uL (ref 0.0–0.7)
Eosinophils Relative: 3 % (ref 0–5)
HCT: 39.9 % (ref 36.0–46.0)
Hemoglobin: 13.1 g/dL (ref 12.0–15.0)
LYMPHS ABS: 1.2 10*3/uL (ref 0.7–4.0)
Lymphocytes Relative: 22 % (ref 12–46)
MCH: 28.6 pg (ref 26.0–34.0)
MCHC: 32.8 g/dL (ref 30.0–36.0)
MCV: 87.1 fL (ref 78.0–100.0)
Monocytes Absolute: 0.7 10*3/uL (ref 0.1–1.0)
Monocytes Relative: 12 % (ref 3–12)
NEUTROS ABS: 3.4 10*3/uL (ref 1.7–7.7)
NEUTROS PCT: 63 % (ref 43–77)
PLATELETS: 359 10*3/uL (ref 150–400)
RBC: 4.58 MIL/uL (ref 3.87–5.11)
RDW: 12.9 % (ref 11.5–15.5)
WBC: 5.5 10*3/uL (ref 4.0–10.5)

## 2014-06-17 LAB — POC URINE PREG, ED: Preg Test, Ur: NEGATIVE

## 2014-06-17 MED ORDER — METOCLOPRAMIDE HCL 10 MG PO TABS: 10.0000 mg | ORAL_TABLET | Freq: Four times a day (QID) | ORAL | Status: AC | PRN

## 2014-06-17 MED ORDER — HYDROCODONE-ACETAMINOPHEN 5-325 MG PO TABS: 1.0000 | ORAL_TABLET | ORAL | Status: AC | PRN

## 2014-06-17 MED ORDER — ONDANSETRON HCL 4 MG/2ML IJ SOLN
4.0000 mg | Freq: Once | INTRAMUSCULAR | Status: AC
  Administered 2014-06-17: 4 mg via INTRAVENOUS
  Filled 2014-06-17: qty 2

## 2014-06-17 MED ORDER — MORPHINE SULFATE 4 MG/ML IJ SOLN
4.0000 mg | Freq: Once | INTRAMUSCULAR | Status: AC
  Administered 2014-06-17: 4 mg via INTRAVENOUS
  Filled 2014-06-17: qty 1

## 2014-06-17 MED ORDER — METOCLOPRAMIDE HCL 5 MG/ML IJ SOLN
10.0000 mg | Freq: Once | INTRAMUSCULAR | Status: AC
  Administered 2014-06-17: 10 mg via INTRAVENOUS
  Filled 2014-06-17: qty 2

## 2014-06-17 MED ORDER — SODIUM CHLORIDE 0.9 % IV BOLUS (SEPSIS)
1000.0000 mL | Freq: Once | INTRAVENOUS | Status: AC
  Administered 2014-06-17: 1000 mL via INTRAVENOUS

## 2014-06-17 NOTE — Discharge Instructions (Signed)
Read the information below.  Use the prescribed medication as directed.  Please discuss all new medications with your pharmacist.  Do not take additional tylenol while taking the prescribed pain medication to avoid overdose.  You may return to the Emergency Department at any time for worsening condition or any new symptoms that concern you.  If you develop high fevers, worsening abdominal pain, uncontrolled vomiting, or are unable to tolerate fluids by mouth, return to the ER for a recheck.  ° °Abdominal Pain °Many things can cause abdominal pain. Usually, abdominal pain is not caused by a disease and will improve without treatment. It can often be observed and treated at home. Your health care provider will do a physical exam and possibly order blood tests and X-rays to help determine the seriousness of your pain. However, in many cases, more time must pass before a clear cause of the pain can be found. Before that point, your health care provider may not know if you need more testing or further treatment. °HOME CARE INSTRUCTIONS  °Monitor your abdominal pain for any changes. The following actions may help to alleviate any discomfort you are experiencing: °· Only take over-the-counter or prescription medicines as directed by your health care provider. °· Do not take laxatives unless directed to do so by your health care provider. °· Try a clear liquid diet (broth, tea, or water) as directed by your health care provider. Slowly move to a bland diet as tolerated. °SEEK MEDICAL CARE IF: °· You have unexplained abdominal pain. °· You have abdominal pain associated with nausea or diarrhea. °· You have pain when you urinate or have a bowel movement. °· You experience abdominal pain that wakes you in the night. °· You have abdominal pain that is worsened or improved by eating food. °· You have abdominal pain that is worsened with eating fatty foods. °· You have a fever. °SEEK IMMEDIATE MEDICAL CARE IF:  °· Your pain does  not go away within 2 hours. °· You keep throwing up (vomiting). °· Your pain is felt only in portions of the abdomen, such as the right side or the left lower portion of the abdomen. °· You pass bloody or black tarry stools. °MAKE SURE YOU: °· Understand these instructions.   °· Will watch your condition.   °· Will get help right away if you are not doing well or get worse.   °Document Released: 12/16/2004 Document Revised: 03/13/2013 Document Reviewed: 11/15/2012 °ExitCare® Patient Information ©2015 [employer]. This information is not intended to replace advice given to you by your health care provider. Make sure you discuss any questions you have with your health care provider. ° ° ° °Emergency Department Resource Guide °1) Find a Doctor and Pay Out of Pocket °Although you won't have to find out who is covered by your insurance plan, it is a good idea to ask around and get recommendations. You will then need to call the office and see if the doctor you have chosen will accept you as a new patient and what types of options they offer for patients who are self-pay. Some doctors offer discounts or will set up payment plans for their patients who do not have insurance, but you will need to ask so you aren't surprised when you get to your appointment. ° °2) Contact Your Local Health Department °Not all health departments have doctors that can see patients for sick visits, but many do, so it is worth a call to see if yours does. If you   don't know where your local health department is, you can check in your phone book. The CDC also has a tool to help you locate your state's health department, and many state websites also have listings of all of their local health departments. ° °3) Find a Walk-in Clinic °If your illness is not likely to be very severe or complicated, you may want to try a walk in clinic. These are popping up all over the country in pharmacies, drugstores, and shopping centers. They're usually staffed  by nurse practitioners or physician assistants that have been trained to treat common illnesses and complaints. They're usually fairly quick and inexpensive. However, if you have serious medical issues or chronic medical problems, these are probably not your best option. ° °No Primary Care Doctor: °- Call Health Connect at  832-8000 - they can help you locate a primary care doctor that  accepts your insurance, provides certain services, etc. °- Physician Referral Service- 1-800-533-3463 ° °Chronic Pain Problems: °Organization         Address  Phone   Notes  °[facility]  (336) 297-2271 Patients need to be referred by their primary care doctor.  ° °Medication Assistance: °Organization         Address  Phone   Notes  °[place] [facility] [address] °[place], [place] [address] (336) 641-8030 --Must be a resident of [place] °-- Must have NO insurance coverage whatsoever (no Medicaid/ Medicare, etc.) °-- The pt. MUST have a primary care doctor that directs their care regularly and follows them in the community °  °[facility]  (866) 331-1348   °[employer]  (888) 892-1162   ° °Agencies that provide inexpensive medical care: °Organization         Address  Phone   Notes  °[facility]  (336) 832-8035   °[facility]    (336) 832-7272   °[facility] [address] °[place], [place] [address] (336) 832-4777   °[facility] [address], °[place] (336) 271-4999   °[facility]    (336) 373-0678   °[facility]    (336) 272-1050   °[facility] ° [address], [place] Phone:  (336) 832-4444, Fax:  (336) 832-4440 Hours of Operation:  9 am - 6 pm, M-F.  Also accepts Medicaid/Medicare and self-pay.  °[facility] ° [address], [place] Phone: (336) 832-3150, Fax: (336) 832-3151. Hours of Operation:   8:30 am - 5:30 pm, M-F.  Also accepts Medicaid and self-pay.  °[facility] [place] [address], [place] Phone: (336) 878-6027   °[facility] [address], [place], [place] (336)723-1848, Ext. 123 Mondays & Thursdays: 7-9 AM.  First 15 patients are seen on a first come, first serve basis. °  ° °Medicaid-accepting [place] Providers: ° °Organization         Address  Phone   Notes  °[facility] [address], [place] (336) 641-2100 Also accepts self-pay patients.  °[facility] [address], [place] ° (336) 856-9996   °[facility] [address], [place] (336) 288-8857   °[facility] [address] [place] [address], [place] (336) 299-7000   °Veita Bland [address],   ° (336) 373-1557 Only accepts [place]   Access Medicaid patients after they have their name applied to their card.  ° °Self-Pay (no insurance) in [place]: ° °Organization         Address  Phone   Notes  °Sickle Cell Patients, [facility] [address], [place] (336) 832-1970   °[facility] [address], [place] (336) 832-4400   °[facility] ° [address], [place] (336) 992-4800   °[facility]/Dr. Osei-Bonsu ° [address] [place] [address], [place] or [address], [place] (336) 841-8500 Phone number for both [place] and [place] locations is the same.  °[facility] [address], [place] (336) 299-0000   °[facility] [address] [place] [address], [place] or [address] (336) 852-7530 °(336) 878-2260   °[facility] [address], [place] (336) 350-1642, phone; (336) 294-5005, fax Sees patients 1st and 3rd Saturday of every month.  Must not qualify for public or private insurance (i.e. Medicaid, Medicare, [place] Health  Choice, Veterans' Benefits) • Household income should be no more than 200% of the poverty level •The clinic cannot treat you if you are pregnant or think you are pregnant • Sexually transmitted diseases are not treated at the clinic.  ° ° °Dental Care: °Organization         Address  Phone  Notes  °[place] [facility] [address], [place] (336) 641-6152 Accepts children up to age 21 who are enrolled in Medicaid or [place] Health Choice; pregnant women with a Medicaid card; and children who have applied for Medicaid or [place] Health Choice, but were declined, whose parents can pay a reduced fee at time of service.  °[place] [place]  [address], [place] (336) 641-7733 Accepts children up to age 21 who are enrolled in Medicaid or [place] Health Choice; pregnant women with a Medicaid card; and children who have applied for Medicaid or [place] Health Choice, but were declined, whose parents can pay a reduced fee at time of service.  °[facility] ° [address], [place] (336) 641-4533 Patients are seen by appointment only. Walk-ins are not accepted. [facility] will see patients 18 years of age and older. °Monday - Tuesday (8am-5pm) °Most Wednesdays (8:30-5pm) °$30 per visit, cash only  °[facility] ° [address], [place] (336) 641-4533 Patients are seen by appointment only. Walk-ins are not accepted. [facility] will see patients 18 years of age and older. °One Wednesday Evening (Monthly: Volunteer Based).  $30 per visit, cash only  °[facility]  (919) [phone] for adults; Children under age 4, call Graduate Pediatric Dentistry at ([phone]) [phone]. Children aged 4-14, please call ([phone]) [phone] to request a pediatric application. ° Dental services are provided in all areas of dental care including fillings, crowns and bridges, complete  and partial dentures, implants, gum treatment, root canals, and extractions. Preventive care is also provided. Treatment is provided to both adults and children. °Patients are selected via a lottery and there is often a waiting list. °  °[facility] [address], °[place] ° (336) 763-8833 [email or web address] °  °[facility] [address], [place], [place] (336)723-1848, Ext. 123 Second and Fourth Thursday of each month, opens at 6:30 AM; Clinic ends at 9   AM.  Patients are seen on a first-come first-served basis, and a limited number are seen during each clinic.  ° °[facility] ° [address], [place], [place] (336) 723-7904   Eligibility Requirements °You must have lived in [place], [place], or [place] counties for at least the last three months. °  You cannot be eligible for state or federal sponsored healthcare insurance, including Veterans Administration, Medicaid, or Medicare. °  You generally cannot be eligible for healthcare insurance through your employer.  °  How to apply: °Eligibility screenings are held every Tuesday and Wednesday afternoon from 1:00 pm until 4:00 pm. You do not need an appointment for the interview!  °[facility] [address], [place], [place] 336-631-2330   °[facility]  336-342-8273   °[facility]  336-703-3100   °[facility]  336-570-6415   ° °Behavioral Health Resources in the Community: °Intensive Outpatient Programs °Organization         Address  Phone  Notes  °[facility] [address], [place], [place] 336-878-6098   °[facility] [address], [place], [place] 336-832-9800   °[facility] [address], [place], [place] ° 336-882-2125   °[facility] [address],  °[place], [place] 1-800-853-5163 or 336-641-4981   °Substance Abuse Resources °Organization          Address  Phone  Notes  °[facility]  336-882-2125   °[facility]  336-784-9470   °[facility]  336-285-9073   °[facility]  336-845-3988   °Residential & Outpatient Substance Abuse Program  1-800-659-3381   °Psychological Services °Organization         Address  Phone  Notes  ° Health  336- 832-9600   °[facility]  336- 378-7881   °[facility] [address], [place] 1-800-853-5163 or 336-641-4981   ° °Mobile Crisis Teams °Organization         Address  Phone  Notes  °[facility], Mobile Crisis Care Unit  1-877-626-1772   °Assertive °[facility] ° [address] [place], [place] 336-834-9664   °Sharon DeEsch [address] °[place] 336-554-5454   ° °Self-Help/Support Groups °Organization         Address  Phone             Notes  °[facility] [place] - variety of support groups  336- 373-1402 Call for more information  °Narcotics Anonymous (NA), [facility] [address], °[place]  2 meetings at this location  ° °Residential Treatment Programs °Organization         Address  Phone  Notes  °[facility] [address],    °[place]  1-866-801-8205   °[facility] ° [address], [place], [place] 704-293-8524   °[facility] [address], [place] 336-845-3988 Admissions: 8am-3pm M-F  °[facility] [address]    °[place], [place] 336-841-1104   °[facility] [address], [place], [place] 336-379-7146   °[facility] [address]  °[place], [place] 336-285-9073   °[facility] Intensive Outpatient [address], [place], [place] 336-852-3033   °[facility] [address]  °[place], [place] 1-877-615-2722 or 336-784-9470   °[facility]   [facility]) [address] [place], [place]  [phone] Accepts Medicaid  [facility] [address]  [place] 3-086-578-46961-(343)701-8635 Substance Abuse/Addiction Treatment   [facility]         Address  Phone  Notes  [facility]  (913) 265-8620(888) 856-289-6786   Angie FavaJulie Brannon, PhD [address] [place], [place]   [phone](336) 210-145-1759 or (613)038-2048(336) [phone]   [facility]   [address] [place], [place] (317)474-3898(336) ([phone]   [facility] [address], [place], [place] 657-651-8108(336) [phone] Insurance/Medicaid/sponsorship through [facility] [address]                                    [place], [place] [phone](336) [phone] Therapy/tele-psych/case  [facility] [address]   [place], [place] [phone](336) [phone]    Dr. Lolly MustacheArfeen  [phone](336) [phone]   [facility] [place]  [employer] [facility] 1) [address], [place] 2) [address], [place] 3)  [address], [place] (252)[phone](336) [phone] [phone](336) [phone]  [phone](336) ([phone]   [facility] Child [facility] (757)824-2068(336) 2063673039 or [phone](336) [phone] (After Hours)

## 2014-06-17 NOTE — ED Provider Notes (Signed)
CSN: [id number]     Arrival date & time 06/17/14  0741 History   First MD Initiated Contact with Patient 06/17/14 [phone]     Chief Complaint  Patient presents with  . Abdominal Pain     (Consider location/radiation/quality/duration/timing/severity/associated sxs/prior Treatment) HPI   Patient presents with intermittent RLQ abdominal pain x 1.5 months, worse for the past 5 days.  States she has been evaluated for this in the ED with CT scan and [place] without any findings, is waiting for gynecology appt at [facility] for evaluation with exploratory laparotomy (pt previously advised possible  adhesions vs endometriosis causing pain).  The pain comes and goes randomly, she has several days in a row of constant pain followed by several days with no pain.  The pain is always located in the same place but now involves a slightly larger area and is moving towards the midline.  Has N/V when the pain is very bad.  Usually controlled with motrin, tylenol, and a heating pad, but not anymore.  Denies fevers, chills, urinary, vaginal, or bowel symptoms.  S/P partial hysterectomy performed due to fibroids.    History reviewed. No pertinent past medical history. Past Surgical History  Procedure Laterality Date  . Vaginal wall repair  2015  . Abdominal hysterectomy     History reviewed. No pertinent family history. History  Substance Use Topics  . Smoking status: Never Smoker   . Smokeless tobacco: Not on file  . Alcohol Use: No   OB History    No data available     Review of Systems  All other systems reviewed and are negative.     Allergies  Review of patient's allergies indicates no known allergies.  Home Medications   Prior to Admission medications   Medication Sig Start Date End Date Taking? Authorizing Provider  acetaminophen (TYLENOL) 325 MG tablet Take 650 mg by mouth every 6 (six) hours as needed.    Historical Provider, MD  ibuprofen (ADVIL,MOTRIN) 800 MG tablet Take 1 tablet (800 mg  total) by mouth every 8 (eight) hours as needed. 04/26/14   Charlestine Night, PA-C  oxycodone (OXY-IR) 5 MG capsule Take 5 mg by mouth every 4 (four) hours as needed.    Historical Provider, MD  oxyCODONE-acetaminophen (PERCOCET/ROXICET) 5-325 MG per tablet Take 1 tablet by mouth every 6 (six) hours as needed for severe pain. 04/26/14   Charlestine Night, PA-C  promethazine (PHENERGAN) 25 MG tablet Take 1 tablet (25 mg total) by mouth every 8 (eight) hours as needed for nausea or vomiting. 04/26/14   Christopher Lawyer, PA-C   BP 157/102 mmHg  Pulse 100  Temp(Src) 98 F (36.7 C) (Oral)  Resp 16  Ht  (1.676 m)  Wt 200 lb (90.719 kg)  BMI 32.30 kg/m2  SpO2 100% Physical Exam  Constitutional: She appears well-developed and well-nourished. No distress.  HENT:  Head: Normocephalic and atraumatic.  Neck: Neck supple.  Cardiovascular: Normal rate and regular rhythm.   Pulmonary/Chest: Effort normal and breath sounds normal. No respiratory distress. She has no wheezes. She has no rales.  Abdominal: Soft. She exhibits no distension. There is tenderness in the right lower quadrant and suprapubic area. There is no rebound and no guarding.  Neurological: She is alert.  Skin: She is not diaphoretic.  Nursing note and vitals reviewed.   ED Course  Procedures (including critical care time) Labs Review Labs Reviewed  URINALYSIS, ROUTINE W REFLEX MICROSCOPIC - Abnormal; Notable for the following:  APPearance HAZY (*)    All other components within normal limits  COMPREHENSIVE METABOLIC PANEL - Abnormal; Notable for the following:    Glucose, Bld 101 (*)    BUN <5 (*)    All other components within normal limits  CBC WITH DIFFERENTIAL/PLATELET  POC URINE PREG, ED    Imaging Review No results found.   EKG Interpretation None       9:33 AM Discussed pt with Dr Jodi MourningZavitz.   10:01 AM Reexamination of abdomen remains unchanged.  Tenderness in RLQ.  No guarding, no rebound.  Pt reports  she feels great improvement.  [facility]  Has no PCP.  Will provide resources for PCP.   MDM   Final diagnoses:  RLQ abdominal pain    Afebrile nontoxic patient with 1.5 months of RLQ pain, evaluated previously in ED for same with negative workup.  Currently without PCP, awaiting appt with [facility] gyn because financial assistance plan available there.  Labs unremarkable.  UA unremarkable.  Reexamination unchanged.  IVF, pain, nausea medication given with great improvement.  I do not believe there would be benefit from repeat imaging at this time.  Pelvic US and CT abd/pelvis from last month reviewed.  D/C home with PCP, gyn follow up, pain medication, reglan for nausea.  Discussed result, findings, treatment, and follow up  with patient.  Pt given return precautions.  Pt verbalizes understanding and agrees with plan.       Trixie Dredgemily Sierra Bissonette, PA-C 06/17/14 1005  Blane OharaJoshua Zavitz, MD 06/17/14 1515

## 2014-06-17 NOTE — ED Notes (Signed)
Pt complaining of abd pain in right lower quadrant since Thursday. Pt states she is nauseous/vomiting. Denies diarrhea.

## 2014-07-10 ENCOUNTER — Emergency Department: Admit: 2014-07-10 | Payer: Self-pay | Primary: Nurse Practitioner

## 2014-07-10 ENCOUNTER — Inpatient Hospital Stay
Admit: 2014-07-10 | Discharge: 2014-07-10 | Disposition: A | Discharge status: Home / Self Care | Payer: Self-pay | Attending: Emergency Medicine

## 2014-07-10 DIAGNOSIS — G8929 Other chronic pain: Secondary | ICD-10-CM

## 2014-07-10 LAB — POC URINE MACROSCOPIC
Bilirubin: NEGATIVE
Blood: NEGATIVE
Glucose: NEGATIVE mg/dl
Ketone: NEGATIVE mg/dl
Leukocyte Esterase: NEGATIVE
Nitrites: NEGATIVE
Protein: NEGATIVE mg/dl
Specific gravity: 1.015 (ref 1.005–1.030)
Urobilinogen: 0.2 EU/dl (ref 0.0–1.0)
pH (UA): 6 (ref 5–9)

## 2014-07-10 LAB — CT/GC DNA
CHLAMYDIA TRACHOMATIS DNA: NOT DETECTED
NEISSERIA GONORRHOEAE DNA: NOT DETECTED

## 2014-07-10 LAB — WET PREP: Wet prep: NONE SEEN

## 2014-07-10 MED ORDER — ONDANSETRON (PF) 4 MG/2 ML INJECTION
4 mg/2 mL | Freq: Once | INTRAMUSCULAR | Status: DC
Start: 2014-07-10 — End: 2014-07-10

## 2014-07-10 MED ORDER — MORPHINE 4 MG/ML SYRINGE
4 mg/mL | INTRAMUSCULAR | Status: AC
Start: 2014-07-10 — End: 2014-07-10
  Administered 2014-07-10: 12:00:00 via INTRAMUSCULAR

## 2014-07-10 MED ORDER — ONDANSETRON 4 MG TAB, RAPID DISSOLVE
4 mg | ORAL | Status: AC
Start: 2014-07-10 — End: 2014-07-10
  Administered 2014-07-10: 12:00:00 via SUBLINGUAL

## 2014-07-10 MED ORDER — HYDROCODONE-ACETAMINOPHEN 5 MG-325 MG TAB
5-325 mg | ORAL | Status: AC
Start: 2014-07-10 — End: 2014-07-10
  Administered 2014-07-10: 14:00:00 via ORAL

## 2014-07-10 MED ORDER — SODIUM CHLORIDE 0.9 % IJ SYRG
Freq: Once | INTRAMUSCULAR | Status: DC
Start: 2014-07-10 — End: 2014-07-10

## 2014-07-10 MED ORDER — HYDROCODONE-ACETAMINOPHEN 5 MG-325 MG TAB
5-325 mg | ORAL_TABLET | Freq: Four times a day (QID) | ORAL | Status: DC | PRN
Start: 2014-07-10 — End: 2014-07-11

## 2014-07-10 MED ORDER — SODIUM CHLORIDE 0.9% BOLUS IV
0.9 % | INTRAVENOUS | Status: DC
Start: 2014-07-10 — End: 2014-07-10

## 2014-07-10 MED ORDER — KETOROLAC TROMETHAMINE 30 MG/ML INJECTION
30 mg/mL (1 mL) | INTRAMUSCULAR | Status: DC
Start: 2014-07-10 — End: 2014-07-10

## 2014-07-10 MED ORDER — IBUPROFEN 800 MG TAB
800 mg | ORAL_TABLET | Freq: Four times a day (QID) | ORAL | Status: DC | PRN
Start: 2014-07-10 — End: 2014-07-11

## 2014-07-10 MED FILL — ONDANSETRON 4 MG TAB, RAPID DISSOLVE: 4 mg | ORAL | Qty: 1

## 2014-07-10 MED FILL — MORPHINE 4 MG/ML SYRINGE: 4 mg/mL | INTRAMUSCULAR | Qty: 1

## 2014-07-10 MED FILL — HYDROCODONE-ACETAMINOPHEN 5 MG-325 MG TAB: 5-325 mg | ORAL | Qty: 2

## 2014-07-10 NOTE — Progress Notes (Signed)
Life Coach had the pleasure of meeting the patient again.  Life Coach has worked with the patient fairly extensively in the past.  Patent examinerLife Coach provided the patient with an abundance of resources.  Life Coach inquired if the patient would like to see a GYN specialist and patient was in agreement.  Life Coach scheduled the patient as follows and will continue to follow up with the patient.   [facility]  [address]  [place], [place] [id number]  [phone]  Appointment:  July 16, 2014  (check in at 11:15 a.m.) ~appt. 11:30 a.m.~  Provider:     Harland DingwallFinley, NP

## 2014-07-10 NOTE — ED Provider Notes (Signed)
[facility]  EMERGENCY DEPARTMENT TREATMENT REPORT  NAME:  [name], [name]  SEX:   F  ADMIT: 07/10/2014  DOB:   [birth year]  MR#    [id number]  ROOM:  PY19  TIME DICTATED: 10 31 AM  ACCT#  [id number]    cc: Iantha Fallen, NP     TIME SEEN:  7:55 a.m.    CHIEF COMPLAINT:  Abdominal pain.    PRIMARY CARE PHYSICIAN:  None.    HISTORY OF PRESENT ILLNESS:  This is a 44 year old female with history of chronic pelvic pain who presents   to the ED complaining of right lower quadrant pain for the past week   associated with some nausea.  She states that it has gotten to the point where   she could not take, was no longer relieved with Tylenol, Motrin and heating   pads.  She said she was diagnosed with ovarian cyst in her right ovary about a   year and a half ago and states that this feels exactly the same.  Her pain   feels like menstrual cramps, has been constant.  She denies any urinary or   vaginal symptoms, has not followed up with an OB/GYN.  She states that she   spoke to a life coach previously but has moved and just came back.  The   patient states that she is having normal bowel movements.  The patient states   that she has had a partial hysterectomy but still has both of her ovaries.    REVIEW OF SYSTEMS:  CONSTITUTIONAL:  No fever.  EYES:   No visual symptoms.  ENT:  No sore throat, runny nose, or other URI symptoms.  ENDOCRINE:  Negative for diabetes.  RESPIRATORY:  No cough, shortness of breath.  CARDIOVASCULAR:  No chest pain.  GASTROINTESTINAL:  Positive for nausea, right upper quadrant abdominal pain.  GENITOURINARY:  No dysuria or vaginal discharge.  MUSCULOSKELETAL:  No joint pain or swelling.  INTEGUMENTARY:  No rashes.  NEUROLOGICAL:  No headaches.    PAST MEDICAL HISTORY:  Ovarian cyst, hypertension, chronic back pain, chronic pelvic pain.    PAST SURGICAL HISTORY:  Orthopedic surgery.    SOCIAL HISTORY:  The patient denies tobacco or drug use.    FAMILY HISTORY:   Father had died of colon cancer.  Mother is alive.     MEDICATIONS:   None.    ALLERGIES:  CODEINE AND ERYTHROMYCIN.     PHYSICAL EXAMINATION:  VITAL SIGNS:  BP 118/85, pulse 87, respirations 15, temperature 98.1, O2 sats   99% on room air.  GENERAL APPEARANCE:  This is an obese Caucasian female in no acute distress   but does appear uncomfortable while lying on the stretcher.  HEENT:  Eyes:  Conjunctivae clear, nonicteric.  RESPIRATORY:  Clear to auscultation bilaterally.  CARDIOVASCULAR:  Regular rate and rhythm, no murmurs, rubs or gallops.  ABDOMEN:  Soft.  Minimally tender in her right lower quadrant but not above   McBurney's point.  No rebound or guarding noted.  MUSCULOSKELETAL:  The patient able to move all four extremities.  No   peripheral edema noted.  SKIN:  Warm and dry without rashes.   NEUROLOGIC:  The patient is alert, oriented, answers questions appropriately.    INITIAL ASSESSMENT AND MANAGEMENT PLAN:  This is a 44 year old female with history of chronic pelvic pain who presents   to the ED complaining of pain in the right lower quadrant, which she states is  consistent with her ovarian cyst.  We will order urinalysis, transvaginal   ultrasound, treat her symptomatically.  We will also have the life coaches   involved.    DIAGNOSTIC INTERPRETATIONS:  Urinalysis unremarkable.  Negative nitrites, leukocyte esterase and blood.    Wet prep pending.  GC chlamydia results pending.  Transvaginal ultrasound as   read by Dr. Kathlene Cote:  Uterus is absent compatible with hysterectomy.  The   right and left ovaries are not visualized.    COURSE IN THE EMERGENCY DEPARTMENT:   The patient remained stable throughout her stay in the ER.  Discussed with her   the results of all her testing.  She was given IV fluids, IV Zofran, IV   morphine and prior to discharge had requested p.o. medications so that they   would last longer.  She was given 2 p.o. Norco.  Life coaches had also talked    to the patient who helped arrange OB/GYN.  Met with the nurse practitioner of   family, scheduled for 04/26 at 11:30 a.m.  Discussed with her to follow up   with the OB as scheduled by a life coach.  Not to operate machinery while   taking Norco.  Also, that her cultures are still pending.  She will get a   phone call if positive and needs treatment.  She agreed to this plan and all   her questions were answered.    DIAGNOSIS:  Acute-on-chronic pelvic pain, right side.    DISPOSITION:  The patient discharged home in stable condition with instructions as stated   above.  Also return to the ER if condition worsens or new symptoms develop.    Prescriptions for Norco and ibuprofen were given.  The patient was personally   evaluated by myself and Dr. Sonny Masters who agrees with the above assessment and   plan.      ___________________  Joice Lofts MD  Dictated By: Lucio Edward, PA-C    My signature above authenticates this document and my orders, the final   diagnosis (es), discharge prescription (s), and instructions in the Epic   record.  If you have any questions please contact [phone].    Nursing notes have been reviewed by the physician/ advanced practice   clinician.    Gentry  D:07/10/2014 10:31:49  T: 07/10/2014 11:10:15  [id number]

## 2014-07-10 NOTE — ED Notes (Signed)
10:18 AM  07/10/2014     Discharge instructions given to patient (name) with verbalization of understanding. Patient accompanied by self.  Patient discharged with the following prescriptions norco, zofran. Patient discharged to per patient her husband is admitted and will go to his room (destination).      LYN DAVID, RN

## 2014-07-10 NOTE — ED Notes (Signed)
Pt ambulatory to triage. Pt c/o RLQ pain x 1 week with nausea. Denies vomiting. Denies diarrhea. Denies urinary symptoms. Denies vaginal dc. Denies any fevers. Pt states she did 975mg  of tylenol 30 mins pta.

## 2014-07-11 ENCOUNTER — Inpatient Hospital Stay
Admit: 2014-07-11 | Discharge: 2014-07-11 | Disposition: A | Discharge status: Home / Self Care | Payer: Self-pay | Attending: Emergency Medicine

## 2014-07-11 ENCOUNTER — Inpatient Hospital Stay
Admit: 2014-07-11 | Discharge: 2014-07-12 | Disposition: A | Discharge status: Home / Self Care | Payer: Self-pay | Attending: Emergency Medicine

## 2014-07-11 DIAGNOSIS — H6092 Unspecified otitis externa, left ear: Secondary | ICD-10-CM

## 2014-07-11 MED ORDER — HYDROCODONE-ACETAMINOPHEN 5 MG-325 MG TAB
5-325 mg | ORAL | Status: AC
Start: 2014-07-11 — End: 2014-07-11
  Administered 2014-07-11: 06:00:00 via ORAL

## 2014-07-11 MED ORDER — NEOMYCIN-POLYMYXIN-HC 3.5 MG-10,000 UNIT/ML-1 % EAR DROPS, SUSP
3.5-10000-1 mg/mL-unit/mL-% | OTIC | Status: AC
Start: 2014-07-11 — End: 2014-07-11
  Administered 2014-07-11: 06:00:00 via OTIC

## 2014-07-11 MED ORDER — DIPHENHYDRAMINE 50 MG CAP
50 mg | ORAL | Status: AC
Start: 2014-07-11 — End: 2014-07-11
  Administered 2014-07-11: 06:00:00 via ORAL

## 2014-07-11 MED ORDER — NEOMYCIN-COLIST-HC-THONZONIUM 3.3 MG-3 MG-10 MG-0.5 MG/ML EAR DROP
Freq: Four times a day (QID) | OTIC | Status: DC
Start: 2014-07-11 — End: 2014-07-13

## 2014-07-11 MED ORDER — OXYCODONE-ACETAMINOPHEN 5 MG-325 MG TAB
5-325 mg | ORAL | Status: AC
Start: 2014-07-11 — End: 2014-07-11
  Administered 2014-07-11: 12:00:00 via ORAL

## 2014-07-11 MED FILL — HYDROCODONE-ACETAMINOPHEN 5 MG-325 MG TAB: 5-325 mg | ORAL | Qty: 1

## 2014-07-11 MED FILL — NEOMYCIN-POLYMYXIN-HC 3.5 MG-10,000 UNIT/ML-1 % EAR DROPS, SUSP: 3.5-10000-1 mg/mL-unit/mL-% | OTIC | Qty: 10

## 2014-07-11 MED FILL — DIPHENHYDRAMINE 50 MG CAP: 50 mg | ORAL | Qty: 1

## 2014-07-11 MED FILL — OXYCODONE-ACETAMINOPHEN 5 MG-325 MG TAB: 5-325 mg | ORAL | Qty: 1

## 2014-07-11 NOTE — ED Notes (Signed)
C/o L ear pain, states she took motrin and norco at 2130 without relief, pt states norco and motrin were prescribed to her-she was here yesterday for abd pain

## 2014-07-11 NOTE — ED Provider Notes (Signed)
[facility]ATMENT REPORT  NAME:  [name], [name]  SEX:   F  ADMIT: 07/11/2014  DOB:   [birth year]  MR#    [id number]  ROOM:  [id number]  TIME DICTATED: 02 03 AM  ACCT#  [id number]        CHIEF COMPLAINT:  Ear pain.    HISTORY OF PRESENT ILLNESS:  The patient is complaining of left ear pain.  She is with her husband upstairs   in the hospital as he is admitted.  She was here earlier today for abdominal   pain.  Workup revealed that she probably has pelvic adhesions; so, she was   referred to gynecology.  She says she was given a prescription for Norco and   Motrin for that.  She took them and still having more ear pain.  She has not   had a fever.  Denies any sore throat or sinus symptoms.  Did not have any ear   pain earlier.  Did take a shower prior to the pain starting up stairs.  Denies   any sore throat, no cough, no difficulty breathing.    REVIEW OF SYSTEMS:  CONSTITUTIONAL:  No fever, chills, weight loss.  ENT:  As described in HPI with ear pain.  RESPIRATORY:  No cough, shortness of breath, or wheezing.  CARDIOVASCULAR:  No chest pain, chest pressure, or palpitations.  GASTROINTESTINAL:  No vomiting, diarrhea, or abdominal pain.  Abdominal pain   as prior without any change.  GENITOURINARY:  No dysuria, frequency, or urgency.  MUSCULOSKELETAL:  No joint pain or swelling.  INTEGUMENTARY:  No rashes.  NEUROLOGICAL:  No headaches, sensory or motor symptoms.    PAST MEDICAL HISTORY:  ADHD, hypertension, anxiety, musculoskeletal disorder, chronic back pain,   orthopedic surgeries in the past.    FAMILY HISTORY:  Noncontributory.    SOCIAL HISTORY:  Drinks alcohol socially, denies smoking or illicit drug use.    MEDICATIONS:  Hydrocodone, ibuprofen prescribed from prior for her pelvic pain.    PHYSICAL EXAMINATION:  VITAL SIGNS:  Reveals a temperature of 98.1, pulse 85, blood pressure 127/88,   respirations 18.  GENERAL:  Well-developed woman.   HEENT:  Eyes:  Conjunctivae clear, lids normal.  Pupils equal, symmetrical,   and normally reactive.   Mouth/Throat:  Surfaces of the pharynx, palate, and   tongue are pink, moist, and without lesions.  ENT reveals right tympanic   membrane clear without any abnormality or inflammation.  Left ear reveals TMs   are clear, good reflexes.  The canal is erythematous, swollen and she tender   at the tragus and around the canal with exam.  LYMPHATIC:   No cervical or submandibular lymphadenopathy palpated.  RESPIRATORY:  Clear and equal breath sounds.  No respiratory distress,   tachypnea, or accessory muscle use.  CARDIOVASCULAR:   Heart regular, without murmurs, gallops, rubs, or thrills.  GI:  Abdomen soft, nontender, without complaint of pain to palpation.  No   hepatomegaly or splenomegaly.  MUSCULOSKELETAL:  Nails:  No clubbing or deformities.  Nail beds pink with   prompt capillary refill.  SKIN:  Warm and dry without rash.   NEUROLOGIC:  Awake, moves everything equally.  No focal motor or sensory   deficits.  BACK:  No CVA tenderness.    EMERGENCY DEPARTMENT MANAGEMENT:  The patient has otitis externa.  She is going to be given Cortisporin   suspension drops.  She is requesting pain medication stronger, but she,  unfortunately, is already on narcotics and anti-inflammatories.  She is due   for another narcotic.  I am going to mix that with some Benadryl as her chief   complaint is she cannot fall sleep with the pain.  We will place some eardrops   in her ear as well.    FINAL IMPRESSION:  Acute otitis externa.    DISPOSITION:  Home.  Continue Cortisporin eardrops and pain medicines as before, keep the   ear dry.  Return to ER for worsening symptoms as needed.      ___________________  Dianna Rossetti M.D.  Dictated By: Marland Kitchen     My signature above authenticates this document and my orders, the final   diagnosis (es), discharge prescription (s), and instructions in the Epic   record.   If you have any questions please contact [phone].    Nursing notes have been reviewed by the physician/ advanced practice   clinician.    SB  D:07/11/2014 02:03:10  T: 07/11/2014 04:56:32  [id number]

## 2014-07-11 NOTE — ED Provider Notes (Signed)
[facility] TREATMENT REPORT  NAME:  Deakins, Jonte  SEX:   F  ADMIT: 07/11/2014  DOB:   12-Jun-1970  MR#    [id number]  ROOM:  [id number]  TIME DICTATED: 08 25 AM  ACCT#  [id number]        TIME OF EVALUATION:  8:07 a.m.    PRIMARY CARE PHYSICIAN:  The patient does not have a primary care physician at this time.    CHIEF COMPLAINT:  Ear pain.    HISTORY OF PRESENT ILLNESS:  The patient is a 44 year old white female presenting with left ear pain.  She   states that she was seen here last night for the ear pain.  She was given   Cortisporin otic drops and she was also seen here yesterday for pelvic pain   and she was given Norco and Motrin.  She has been taking Norco and Motrin and   using the drops and the ear pain is just progressively getting worse.  She has   been applying a warm rag to the left ear and it is not helping either.  She   states the pain is so bad that she cannot tolerate it.  No fever, no chills,   no other signs or symptoms or complaints at this time.  The patient is   requesting something stronger for pain.       REVIEW OF SYSTEMS:  CONSTITUTIONAL:  No fever, chills, or weight loss.   EYES:  No visual symptoms.   ENT:  Negative sore throat.  Negative runny or stuffy nose.  Positive left ear   pain, positive left ear discharge.  RESPIRATORY:  No cough, shortness of breath, or wheezing.   CARDIOVASCULAR:  No chest pain, chest pressure, or palpitations.   GASTROINTESTINAL:  No vomiting, diarrhea, or abdominal pain.  Positive chronic   pelvic pain.    GENITOURINARY:  No dysuria, frequency, or urgency.   MUSCULOSKELETAL:  No joint pain or swelling.   INTEGUMENTARY:  No rashes.   NEUROLOGICAL:  No headache, sensory or motor symptoms.   PSYCHIATRIC:  No suicidal or homicidal ideation.   Denies complaints in all other systems.     PAST MEDICAL HISTORY:  ADHD, hypertension, anxiety, musculoskeletal disorder, chronic back pain,   chronic pelvic pain.    PAST SURGICAL HISTORY:   Orthopedic surgery.    FAMILY HISTORY:   Negative.    SOCIAL HISTORY:  She has never been a smoker.  She does use alcohol.  She denies drug use.  She   has a husband, she is married.    ALLERGIES:  CODEINE, ERYTHROMYCIN.    MEDICATIONS:  She is currently taking Norco, Motrin and Cortisporin otic drops.    PHYSICAL EXAMINATION:  VITAL SIGNS:  Temperature is 97.5 degrees Fahrenheit, pulse 103, respirations   20, blood pressure is 114/81 with a MAP of 82, SpO2 is 100% on room air, pain   is 10 out of 10.  She is 5 feet 6 inches and weighs 200 pounds.  GENERAL APPEARANCE:  Patient appears well developed and well nourished.    Appearance and behavior are age and situation appropriate.   HEENT:  Eyes:  Conjunctivae clear, lids normal.  Pupils equal, symmetrical,   and normally reactive.  Ears and nose:  Hearing is grossly intact to voice.    Internal and external exam of the nose is normal and unremarkable.  Right   external ear is normal, right ear canal are  clear and normal.  Right tympanic   membrane is normal.  Left external ear shows some edema to the external   auditory meatus.  Left ear canal is erythematous with a white discharge.    Tympanic membrane not visible due to edema of the ear canal and discharge in   the ear canal.  Mouth/Throat:  Surfaces of the pharynx, palate, and tongue are   pink, moist, and without lesions.  Nasal mucosa, septum, and turbinates   unremarkable.    LYMPHATICS:  No cervical or submandibular lymphadenopathy palpated.   NECK:  Soft and supple.    RESPIRATORY:  Clear and equal breath sounds.  No respiratory distress,   tachypnea, or accessory muscle use.   CARDIOVASCULAR:  Heart regular, without murmurs, gallops, rubs, or thrills.    DP pulses 2+ and equal bilaterally.    SKIN:  Warm and dry without rashes.   NEUROLOGIC:  Alert, oriented.  Sensation intact, motor strength equal and   symmetric.   PSYCHIATRIC:  Judgment appears appropriate.      INITIAL ASSESSMENT:    The patient looks like she is uncomfortable.  She is laying on the stretcher   holding her ear.  She definitely looks like she is in pain.  Differential   diagnosis: Otitis externa, otalgia, otitis media.  No labs ordered here.  I   did give her 1 Percocet here.  No diagnostic studies done here.    PROCEDURE:  Placed an ear wick to the left ear using the patient's Cortisporin drops,   placed 3 drops in the ear with the ear wick in place.  The ear wick expanded.    The patient had some mild pain throughout the procedure, but tolerated the   procedure well.    DIAGNOSIS:  Left otitis externa.    DISPOSITION:  The patient advised to continue the Norco, the Motrin and the Cortisporin otic   drops and to give her ENT, Dr. Kem Kays, the number to follow up with if the ear   pain does not improve.  Advised that she may continue the warm compresses if   she wishes.  She agreed with the treatment plan.  Discharge instructions   given.  The patient is discharged home in stable condition, with instructions   to follow up with their regular doctor.  They are advised to return   immediately for any worsening or symptoms of concern.  I advised her that the   medications that we gave her are sufficient for pain.  There is nothing that   we could give her that is going to change anything.  Offered to give her   something here.       The patient was personally evaluated by myself and Dr. Haze Justin who agrees   with the above assessment and plan.      ___________________  Konrad Felix MD  Dictated By: Leamon Arnt, PA    My signature above authenticates this document and my orders, the final   diagnosis (es), discharge prescription (s), and instructions in the Epic   record.  If you have any questions please contact [phone].    Nursing notes have been reviewed by the physician/ advanced practice   clinician.    ST  D:07/11/2014 08:25:00  T: 07/11/2014 09:29:11  [id number]

## 2014-07-11 NOTE — ED Notes (Signed)
Pt states she was seen here this morning at 0300 this morning and dx with an ear infection. Pt states pain isn't getting any better and wants to be seen again.

## 2014-07-11 NOTE — ED Notes (Signed)
Pt was here for the same last night, states pain is worse

## 2014-07-11 NOTE — ED Notes (Signed)
I have reviewed discharge instructions with the patient.  The patient verbalized understanding.

## 2014-07-12 MED ORDER — HYDROCODONE-ACETAMINOPHEN 5 MG-325 MG TAB
5-325 mg | ORAL_TABLET | Freq: Four times a day (QID) | ORAL | Status: DC | PRN
Start: 2014-07-12 — End: 2014-07-13

## 2014-07-12 MED ORDER — HYDROCODONE-ACETAMINOPHEN 5 MG-325 MG TAB
5-325 mg | ORAL | Status: AC
Start: 2014-07-12 — End: 2014-07-11
  Administered 2014-07-12: 01:00:00 via ORAL

## 2014-07-12 MED ORDER — IBUPROFEN 400 MG TAB
400 mg | ORAL | Status: AC
Start: 2014-07-12 — End: 2014-07-11
  Administered 2014-07-12: 01:00:00 via ORAL

## 2014-07-12 MED ORDER — IBUPROFEN 800 MG TAB
800 mg | ORAL_TABLET | Freq: Four times a day (QID) | ORAL | Status: DC | PRN
Start: 2014-07-12 — End: 2014-07-13

## 2014-07-12 MED FILL — IBUPROFEN 400 MG TAB: 400 mg | ORAL | Qty: 2

## 2014-07-12 MED FILL — HYDROCODONE-ACETAMINOPHEN 5 MG-325 MG TAB: 5-325 mg | ORAL | Qty: 1

## 2014-07-12 NOTE — ED Provider Notes (Signed)
[facility]  EMERG[facility] TREATMENT REPORT  NAME:  [name], [name]  SEX:   F  ADMIT: 07/11/2014  DOB:   02/03/1971  MR#    [id number]  ROOM:  OTF  TIME DICTATED: 02 57 AM  ACCT#  [id number]        TIME OF EVALUATION:  2056    PRIMARY CARE PHYSICIAN:  None    CHIEF COMPLAINT:  Ear pain.    HISTORY OF PRESENT ILLNESS:  A 44 year old female, third visit in 3 days, who was diagnosed with otitis   media last night, early this morning.  A wick was placed.  She presents   because she has used up the Vicodin she had from prior ovarian cyst and pain   is uncontrolled with motion.  She denies any increase hearing loss or drainage   from the ear.    REVIEW OF SYSTEMS:  CONSTITUTIONAL:  No fevers.  ENT:  Left ear pain.  No sore throat, no runny nose.    PAST MEDICAL HISTORY:  ADHD, hypertension, anxiety, chronic back pain.    SOCIAL HISTORY:  Nonsmoker.    FAMILY HISTORY:  Noncontributory.    ALLERGIES:  REVIEWED IN EPIC.    MEDICATIONS:  NEOMYCIN.    PHYSICAL EXAMINATION:  VITAL SIGNS:  Blood pressure not obtained, pulse 93, respirations 18,   temperature 98.8, pain 10 out of 10, O2 sats 97% on room air.  GENERAL APPEARANCE:  Patient appears well developed and well nourished.    Appearance and behavior are age and situation appropriate.   HEENT:  Eyes:  Conjunctivae clear, lids normal.  Pupils equal, symmetrical and   normally reactive.  ENT:  Shonna ChockWick in left externa canal.  Right internal and   external examinations of the ears are unremarkable.  Tugging on the tragus   with the auricle of the left ear reproduces the patient's pain.  Hearing   grossly intact to the left ear.  Mouth/Throat:  Surfaces of the pharynx,   palate and tongue are pink, moist and without lesions.  Nasal mucosa, septum,   and turbinates unremarkable.  Teeth and gums unremarkable.     INITIAL ASSESSMENT AND MANAGEMENT PLAN:  I offered to remove the patient's ear wick.  We both agreed that since she had    this evaluated this same morning, she has not had increased hearing loss or   drainage and her pain is because she has run out of pain medicine that we do   not believe it is necessary at this time.  She does decline this examination.    Recommend follow up with PCP, return with new or worsening symptoms.  We will   give the patient analgesic medication as she has run out.  PMP did not show   any other prescriptions in the last 30 days except for the one mentioned for   her ovarian cyst.    DIAGNOSTIC STUDIES:  None.    COURSE:  The patient medicated, Motrin and Norco.  Did not develop other symptoms.    Informed of results and discharged.    FINAL DIAGNOSIS:  Otitis externa, left ear, unspecified type.    DISPOSITION AND TREATMENT PLAN:  Discharge instructions given.  Prescription for Norco.  The patient is   discharged home in stable condition, with instructions to follow up with their   regular doctor.  They are advised to return immediately for any worsening or   symptoms of concern.  The patient was personally  evaluated by myself and Dr.   Victorino Dike Himmel-Cherylyn Sundby who agrees with the above assessment and plan.      ___________________  Liberty Handy Himmel-Rondrick Barreira DO  Dictated By: Mearl Latin. Lockie Pares, PA-C    My signature above authenticates this document and my orders, the final   diagnosis (es), discharge prescription (s), and instructions in the Epic   record.  If you have any questions please contact [phone].    Nursing notes have been reviewed by the physician/ advanced practice   clinician.    SC  D:07/12/2014 02:57:36  T: 07/12/2014 08:24:09  [id number]

## 2014-07-13 ENCOUNTER — Observation Stay
Admit: 2014-07-13 | Discharge: 2014-07-16 | Disposition: A | Discharge status: Home / Self Care | Payer: Self-pay | Attending: Internal Medicine | Admitting: Internal Medicine

## 2014-07-13 ENCOUNTER — Emergency Department: Admit: 2014-07-13 | Payer: Self-pay | Primary: Nurse Practitioner

## 2014-07-13 DIAGNOSIS — H60503 Unspecified acute noninfective otitis externa, bilateral: Secondary | ICD-10-CM

## 2014-07-13 LAB — METABOLIC PANEL, BASIC
BUN: 7 mg/dl (ref 7–25)
CO2: 25 mEq/L (ref 21–32)
Calcium: 9.3 mg/dl (ref 8.5–10.1)
Chloride: 103 mEq/L (ref 98–107)
Creatinine: 0.7 mg/dl (ref 0.6–1.3)
GFR est AA: >60
GFR est non-AA: >60
Glucose: 104 mg/dl (ref 74–106)
Potassium: 3.9 mEq/L (ref 3.5–5.1)
Sodium: 136 mEq/L (ref 136–145)

## 2014-07-13 LAB — POC URINE MACROSCOPIC
Bilirubin: NEGATIVE
Blood: NEGATIVE
Glucose: NEGATIVE mg/dl
Ketone: NEGATIVE mg/dl
Leukocyte Esterase: NEGATIVE
Nitrites: NEGATIVE
Protein: NEGATIVE mg/dl
Specific gravity: 1.01 (ref 1.005–1.030)
Urobilinogen: 0.2 EU/dl (ref 0.0–1.0)
pH (UA): 6 (ref 5–9)

## 2014-07-13 LAB — CBC WITH AUTOMATED DIFF
BASOPHILS: 0.2 % (ref 0–3)
EOSINOPHILS: 1 % (ref 0–5)
HCT: 37.4 % (ref 37.0–50.0)
HGB: 12.5 gm/dl — ABNORMAL LOW (ref 13.0–17.2)
IMMATURE GRANULOCYTES: 0.5 % (ref 0.0–3.0)
LYMPHOCYTES: 14.8 % — ABNORMAL LOW (ref 28–48)
MCH: 29.4 pg (ref 25.4–34.6)
MCHC: 33.4 gm/dl (ref 30.0–36.0)
MCV: 88 fL (ref 80.0–98.0)
MONOCYTES: 13 % (ref 1–13)
MPV: 10.4 fL — ABNORMAL HIGH (ref 6.0–10.0)
NEUTROPHILS: 70.5 % — ABNORMAL HIGH (ref 34–64)
NRBC: 0 (ref 0–0)
PLATELET: 264 10*3/uL (ref 140–450)
RBC: 4.25 M/uL (ref 3.60–5.20)
RDW-SD: 40.2 (ref 36.4–46.3)
WBC: 9.9 10*3/uL (ref 4.0–11.0)

## 2014-07-13 LAB — POC HCG,URINE: HCG urine, QL: NEGATIVE

## 2014-07-13 MED ORDER — HYDROMORPHONE (PF) 1 MG/ML IJ SOLN
1 mg/mL | Freq: Once | INTRAMUSCULAR | Status: AC
Start: 2014-07-13 — End: 2014-07-13
  Administered 2014-07-13: via INTRAVENOUS

## 2014-07-13 MED ORDER — MORPHINE 4 MG/ML SYRINGE
4 mg/mL | INTRAMUSCULAR | Status: AC
Start: 2014-07-13 — End: 2014-07-13
  Administered 2014-07-13: 16:00:00 via INTRAVENOUS

## 2014-07-13 MED ORDER — CIPROFLOXACIN IN D5W 400 MG/200 ML IV PIGGY BACK
400 mg/200 mL | INTRAVENOUS | Status: AC
Start: 2014-07-13 — End: 2014-07-13
  Administered 2014-07-13: 16:00:00 via INTRAVENOUS

## 2014-07-13 MED ORDER — SODIUM CHLORIDE 0.9 % IJ SYRG
Freq: Three times a day (TID) | INTRAMUSCULAR | Status: DC
Start: 2014-07-13 — End: 2014-07-16
  Administered 2014-07-13 – 2014-07-16 (×5): via INTRAVENOUS

## 2014-07-13 MED ORDER — CIPROFLOXACIN-DEXAMETHASONE 0.3 %-0.1 % EAR DROPS, SUSP
Freq: Two times a day (BID) | OTIC | Status: DC
Start: 2014-07-13 — End: 2014-07-16
  Administered 2014-07-13 – 2014-07-16 (×6): via OTIC

## 2014-07-13 MED ORDER — ONDANSETRON (PF) 4 MG/2 ML INJECTION
4 mg/2 mL | Freq: Four times a day (QID) | INTRAMUSCULAR | Status: DC | PRN
Start: 2014-07-13 — End: 2014-07-15
  Administered 2014-07-14 – 2014-07-15 (×5): via INTRAVENOUS

## 2014-07-13 MED ORDER — CIPROFLOXACIN IN D5W 400 MG/200 ML IV PIGGY BACK
400 mg/200 mL | Freq: Two times a day (BID) | INTRAVENOUS | Status: DC
Start: 2014-07-13 — End: 2014-07-13

## 2014-07-13 MED ORDER — SODIUM CHLORIDE 0.9 % IJ SYRG
INTRAMUSCULAR | Status: DC | PRN
Start: 2014-07-13 — End: 2014-07-16
  Administered 2014-07-14: 18:00:00 via INTRAVENOUS

## 2014-07-13 MED ORDER — SODIUM CHLORIDE 0.9 % IJ SYRG
Freq: Once | INTRAMUSCULAR | Status: AC
Start: 2014-07-13 — End: 2014-07-13
  Administered 2014-07-13: 16:00:00 via INTRAVENOUS

## 2014-07-13 MED ORDER — HYDROMORPHONE (PF) 1 MG/ML IJ SOLN
1 mg/mL | INTRAMUSCULAR | Status: DC | PRN
Start: 2014-07-13 — End: 2014-07-13

## 2014-07-13 MED ORDER — CIPROFLOXACIN IN D5W 400 MG/200 ML IV PIGGY BACK
400 mg/200 mL | INTRAVENOUS | Status: DC
Start: 2014-07-13 — End: 2014-07-13

## 2014-07-13 MED ORDER — DEXAMETHASONE SODIUM PHOSPHATE 10 MG/ML IJ SOLN
10 mg/mL | Freq: Once | INTRAMUSCULAR | Status: AC
Start: 2014-07-13 — End: 2014-07-13
  Administered 2014-07-13: 18:00:00 via INTRAVENOUS

## 2014-07-13 MED ORDER — HYDROCODONE-ACETAMINOPHEN 5 MG-325 MG TAB
5-325 mg | ORAL | Status: AC
Start: 2014-07-13 — End: 2014-07-13
  Administered 2014-07-13: 20:00:00 via ORAL

## 2014-07-13 MED ORDER — CIPROFLOXACIN IN D5W 400 MG/200 ML IV PIGGY BACK
400 mg/200 mL | Freq: Two times a day (BID) | INTRAVENOUS | Status: DC
Start: 2014-07-13 — End: 2014-07-16
  Administered 2014-07-14 – 2014-07-16 (×6): via INTRAVENOUS

## 2014-07-13 MED ORDER — SODIUM CHLORIDE 0.9% BOLUS IV
0.9 % | INTRAVENOUS | Status: AC
Start: 2014-07-13 — End: 2014-07-13
  Administered 2014-07-13: 16:00:00 via INTRAVENOUS

## 2014-07-13 MED ORDER — CLINDAMYCIN IN D5W 600 MG/50 ML IV PIGGY BACK
600 mg/50 mL | INTRAVENOUS | Status: DC
Start: 2014-07-13 — End: 2014-07-13

## 2014-07-13 MED ORDER — HYDROMORPHONE (PF) 1 MG/ML IJ SOLN
1 mg/mL | INTRAMUSCULAR | Status: DC | PRN
Start: 2014-07-13 — End: 2014-07-14
  Administered 2014-07-13 – 2014-07-14 (×4): via INTRAVENOUS

## 2014-07-13 MED ORDER — HYDROMORPHONE (PF) 1 MG/ML IJ SOLN
1 mg/mL | INTRAMUSCULAR | Status: AC
Start: 2014-07-13 — End: 2014-07-13
  Administered 2014-07-13: 17:00:00 via INTRAVENOUS

## 2014-07-13 MED ORDER — ONDANSETRON (PF) 4 MG/2 ML INJECTION
4 mg/2 mL | Freq: Once | INTRAMUSCULAR | Status: AC
Start: 2014-07-13 — End: 2014-07-13
  Administered 2014-07-13: 16:00:00 via INTRAVENOUS

## 2014-07-13 MED FILL — BD POSIFLUSH NORMAL SALINE 0.9 % INJECTION SYRINGE: INTRAMUSCULAR | Qty: 20

## 2014-07-13 MED FILL — CIPRODEX 0.3 %-0.1 % EAR DROPS,SUSPENSION: OTIC | Qty: 7.5

## 2014-07-13 MED FILL — HYDROMORPHONE (PF) 1 MG/ML IJ SOLN: 1 mg/mL | INTRAMUSCULAR | Qty: 1

## 2014-07-13 MED FILL — HYDROCODONE-ACETAMINOPHEN 5 MG-325 MG TAB: 5-325 mg | ORAL | Qty: 1

## 2014-07-13 MED FILL — CIPROFLOXACIN IN D5W 400 MG/200 ML IV PIGGY BACK: 400 mg/200 mL | INTRAVENOUS | Qty: 200

## 2014-07-13 MED FILL — DEXAMETHASONE SODIUM PHOSPHATE 10 MG/ML IJ SOLN: 10 mg/mL | INTRAMUSCULAR | Qty: 1

## 2014-07-13 MED FILL — MORPHINE 4 MG/ML SYRINGE: 4 mg/mL | INTRAMUSCULAR | Qty: 2

## 2014-07-13 MED FILL — ONDANSETRON (PF) 4 MG/2 ML INJECTION: 4 mg/2 mL | INTRAMUSCULAR | Qty: 2

## 2014-07-13 NOTE — ED Notes (Signed)
Patient upset states "Someone down in the ED told me that I could have Dilaudid every 2 hours now you are telling me that it is every 4"

## 2014-07-13 NOTE — ED Notes (Signed)
Jacki ConesLaurie, LPN requested to be called back. States she is unable to call report and will call me back asap. My direct number was given to her. CRN notified.

## 2014-07-13 NOTE — ED Notes (Signed)
Patient states "since I can not have dilaudid every two hours,  Can I have the Norco every two hours.) Advised patient that that medication last longer and is normally only given every 6-8 hours or so and as an order from the DR. Told patient that I would talk to PA

## 2014-07-13 NOTE — ED Notes (Signed)
TRANSFER - OUT REPORT:    Verbal report given to Jacki ConesLaurie, LPN (name) on [name]  being transferred to EDO 4(unit) for routine progression of care       Report consisted of patient???s Situation, Background, Assessment and   Recommendations(SBAR).     Information from the following report(s) SBAR, Kardex, ED Summary and Procedure Summary was reviewed with the receiving nurse.    Lines:   Peripheral IV 07/13/14 Left Antecubital (Active)   Site Assessment Clean, dry, & intact 07/13/2014 11:33 AM   Phlebitis Assessment 0 07/13/2014 11:33 AM   Infiltration Assessment 0 07/13/2014 11:33 AM   Dressing Status Clean, dry, & intact 07/13/2014 11:33 AM   Dressing Type Transparent 07/13/2014 11:33 AM   Action Taken Blood drawn 07/13/2014 11:33 AM        Opportunity for questions and clarification was provided.      Patient transported with:   [employer]

## 2014-07-13 NOTE — ED Provider Notes (Signed)
[facility]  EMERGENCY DEPARTMENT TREATMENT REPORT  NAME:  [name], [name]  SEX:   F  ADMIT: 07/13/2014  DOB:   05-16-1970  MR#    [id number]  ROOM:  [id number]  TIME DICTATED: 02 49 PM  ACCT#  [id number]        I hereby certify this patient for admission based upon medical necessity as   noted below.       TIME OF EVALUATION:  10:50    CHIEF COMPLAINT:  Bilateral ear pain, swelling and drainage.    HISTORY OF PRESENT ILLNESS:  This 44 year old female presents with bilateral left ear pain that started   over 3 days ago.  She was seen here in the Emergency Department on 07/11/2014   early in the morning and was diagnosed with swimmer's ear.  She came back   later that day and was reevaluated for pain.  She had been started on some   Cortisporin eardrops and later that day was given Percocet and a wick was   placed in her left ear.  The patient states the only thing she can think that   caused the swimmer's ear was showering at a hospital while her husband was   hospitalized, and she thinks the water got in her ear a few days ago.  She   denies any recent underwater swimming.  She is now having some significant   preauricular swelling, especially on the left side, and now the right side has   clogged up and is draining some white fluid and is also starting to swell and   become painful.  The pain radiates towards her jaw.  She states she feels   febrile and chilled.  She cannot get the eardrops in at this point because of   the swelling and the drainage inside her ear.  She has not had multiple   swimmer's ear infections in the past.  She states she does not have a history   of diabetes, but does admit to increased frequency with urination, polyuria   and polydipsia.  She has never been tested to her knowledge for diabetes in   the past.  She denies any runny nose, nasal congestion or postnasal drip.  She   does have a mild sore throat.    REVIEW OF SYSTEMS:  CONSTITUTIONAL:  Subjective fever and chills.   EYES:  No visual symptoms.   ENT:  Per HPI.  ENDOCRINE:  Per HPI.  RESPIRATORY:  No cough, shortness of breath or wheezing.  GASTROINTESTINAL:  No nausea, vomiting or abdominal pain.  MUSCULOSKELETAL:  No myalgias or arthralgias.  INTEGUMENTARY:  No rashes.   NEUROLOGIC:  Has a headache.    PAST MEDICAL HISTORY:  ADHD, hypertension, anxiety, chronic back pain, endometriosis and ovarian   cyst.    SOCIAL HISTORY:  No tobacco.  Minimal EtOH.  No recreational drugs.    ALLERGIES:  CODEINE CAUSES ITCHING, ERYTHROMYCIN.    FAMILY HISTORY:  Mom has diabetes, coronary artery disease and cancers run in the family.    PHYSICAL EXAMINATION:  VITAL SIGNS:  Blood pressure 122/68, pulse 100, respirations 18, temperature   99, O2 saturation is 100%.  GENERAL:  The patient is a moderately overweight Caucasian female who is lying   on her left side.  She appears to be in pain, nontoxic appearing.  HEENT:  Eyes:  Conjunctivae clear, lids normal.  Pupils equal, symmetrical and   normally reactive.  ENT:  Hearing is grossly  intact to voice.  Internal   examination of both ears shows edematous preauricular area with tragal   tenderness and some whitish fluid draining out of her ear.  The canal is   extremely swollen.  I cannot see the tympanic membrane due to the swelling.    She has no mastoid tenderness bilaterally.  She has marked swelling anteriorly   over the left preauricular area which is also tender.  I suspect there is   preauricular lymphadenopathy with the swelling.  No submandibular   lymphadenopathy, no sign of Ludwig's angina.  No cervical lymphadenopathy   palpated.  Mouth/throat:  Buccal mucosa is slightly dry.  Posterior pharynx is   erythematous without enlarged exudative tonsils which are approximately 1+   enlarged.  Uvula is midline.  Swallows without difficulty.  NECK:  Supple, no nuchal rigidity, symmetrical.  Trachea is midline.  CARDIOVASCULAR:  Increased rate, regular rhythm.  No murmurs, gallops, rubs or    thrills.  GASTROINTESTINAL:  Abdomen is soft, nontender.  RESPIRATORY:  Clear and equal breath sounds.  No respiratory distress,   tachypnea or accessory muscle use.   SKIN:  Warm and dry, no rashes.      CONTINUATION BY CATHERINE WALGREN, PA-C:    INITIAL ASSESSMENT AND MANAGEMENT PLAN:  This 44 year old female presents with severe bilateral ear pain and clinically   is showing signs and symptoms of acute otitis externa.  We cannot visualize   the tympanic membrane.  Her ear canals are completely swollen and filled with   a white thin fluid.  She has tragal tenderness and anterior swelling,   especially over the left side of her face just anterior to the ear and over   the parotid area.  She has been seen twice for her ear problem and she could   not keep a wick inside her ear, and she does not feel the antibiotic eardrops   were going in.  She feels febrile and continues to be in severe pain  and even   the Percocet has not helped.  We will establish an IV and medicate her with   morphine and Zofran initially.  We will also order CT maxillofacial without   contrast to assess for signs of infection, mastoiditis or other complications   in the maxillofacial area.  The patient will be medicated with Cipro.    Concerns would be if the patient has a malignant otitis externa complicated by   diabetes; therefore, we will check some basic labs to ensure the patient is   not diabetic.  She is complaining of some diabetic symptoms.  We will order   urinalysis, a CBC and BMP.    COURSE IN THE EMERGENCY DEPARTMENT:  The patient's pain did not improve on the morphine and Zofran.  We switched   her over to Dilaudid which helped decrease her pain significantly.    DIAGNOSTIC STUDIES:  Urinalysis is negative.  Urine pregnancy is negative.  CBC within normal   limits.  BMP within normal limits.  Glucose 104.  CT scan maxillofacial shows:    (1) Cervical adenopathy.  (2) Abnormal soft tissue thickening both external    auditory canals suggesting otitis externa occluding the canal itself.    Abnormal soft tissue density both middle ear cavities which may represent   chronic otitis media.  (3) Borderline sized intraparotid nodes with fat   stranding that could indicate parotiditis, read by the radiologist.    MEDICAL DECISION:  The patient  with intractable pain on IV antibiotics now for treatment of   otitis externa.  We started her on Cipro.  We feel the patient would benefit   from being placed in the observation unit for continued IV antibiotics and   pain management.  We gave her 10 mg of Decadron IV and we ordered Ciprodex ear   drops.  Wicks  were placed in both ears.  This was painful for the patient,   but she tolerated it well.    FINAL DIAGNOSES:  1.  Acute bilateral otitis externa with intractable pain.  2.  Parotiditis.      DISPOSITION AND PLAN:  The patient was placed in the ED observation unit for continued pain   management, IV antibiotics and eardrops.  The hope is to send her home   tomorrow after her second dose of Cipro this morning.  Life coach was ordered   and hopefully they can help her set up ENT followup as soon as possible since   the patient is uninsured.  The patient was personally evaluated by myself and   Dr. Arvella Merles, who agrees with the above assessment and plan.      ___________________  Smitty Cords MD  Dictated By: Geoffry Paradise. Katheran James, PA-C    My signature above authenticates this document and my orders, the final   diagnosis (es), discharge prescription (s), and instructions in the Epic   record.  If you have any questions please contact [phone].    Nursing notes have been reviewed by the physician/ advanced practice   clinician.    MLT  D:07/13/2014 14:49:01  T: 07/13/2014 15:11:31  [id number]

## 2014-07-13 NOTE — ED Progress Note (Signed)
Pt called the hospital operator stating that she was having increasing pain.  I did evaluate her.  She was tearful but able to answer question.  Has persistent swelling to the bilateral ear with pain.  Extra dose of dilaudid ordered.  Pt advised to only call our nurses to report pain and that we would do our best to address her issues.  Pt voiced understanding.  Cont pain control and antibiotics.

## 2014-07-13 NOTE — ED Progress Note (Signed)
Pt is currently sleeping.  Will continue to monitor, treat symptomatically.

## 2014-07-13 NOTE — ED Notes (Signed)
B/l ear pains. Left ear started 1 week ago. Right ear started yesterday.

## 2014-07-13 NOTE — ED Notes (Signed)
Box lunches given to patient

## 2014-07-13 NOTE — ED Notes (Signed)
Patient c/o Pain states 10/10 Patient last had Dilauded at 1255 and is not due. Spoke with PA-C and will order Norco PO. order for Cipro also changed to q 12 hrs.

## 2014-07-14 LAB — CREATININE
Creatinine: 0.7 mg/dl (ref 0.6–1.3)
Creatinine: 0.7 mg/dl (ref 0.6–1.3)
GFR est AA: >60
GFR est AA: >60
GFR est non-AA: >60
GFR est non-AA: >60

## 2014-07-14 MED ORDER — NALOXONE 0.4 MG/ML INJECTION
0.4 mg/mL | INTRAMUSCULAR | Status: DC | PRN
Start: 2014-07-14 — End: 2014-07-16

## 2014-07-14 MED ORDER — HYDROCODONE-ACETAMINOPHEN 5 MG-325 MG TAB
5-325 mg | Freq: Four times a day (QID) | ORAL | Status: DC | PRN
Start: 2014-07-14 — End: 2014-07-14
  Administered 2014-07-14 (×2): via ORAL

## 2014-07-14 MED ORDER — RANITIDINE 150 MG TAB
150 mg | Freq: Two times a day (BID) | ORAL | Status: DC
Start: 2014-07-14 — End: 2014-07-16
  Administered 2014-07-15 – 2014-07-16 (×3): via ORAL

## 2014-07-14 MED ORDER — HYDROMORPHONE (PF) 1 MG/ML IJ SOLN
1 mg/mL | Freq: Once | INTRAMUSCULAR | Status: AC
Start: 2014-07-14 — End: 2014-07-14
  Administered 2014-07-14: 23:00:00 via INTRAVENOUS

## 2014-07-14 MED ORDER — IBUPROFEN 400 MG TAB
400 mg | Freq: Three times a day (TID) | ORAL | Status: DC
Start: 2014-07-14 — End: 2014-07-16
  Administered 2014-07-14 – 2014-07-16 (×6): via ORAL

## 2014-07-14 MED ORDER — HYDROMORPHONE 2 MG TAB
2 mg | Freq: Four times a day (QID) | ORAL | Status: DC | PRN
Start: 2014-07-14 — End: 2014-07-16
  Administered 2014-07-14 – 2014-07-16 (×8): via ORAL

## 2014-07-14 MED ORDER — OXYCODONE-ACETAMINOPHEN 5 MG-325 MG TAB
5-325 mg | ORAL | Status: DC | PRN
Start: 2014-07-14 — End: 2014-07-14
  Administered 2014-07-14: 16:00:00 via ORAL

## 2014-07-14 MED ORDER — CLONAZEPAM 1 MG TAB
1 mg | Freq: Three times a day (TID) | ORAL | Status: DC
Start: 2014-07-14 — End: 2014-07-16
  Administered 2014-07-14 – 2014-07-16 (×7): via ORAL

## 2014-07-14 MED ORDER — IBUPROFEN 400 MG TAB
400 mg | Freq: Three times a day (TID) | ORAL | Status: DC | PRN
Start: 2014-07-14 — End: 2014-07-16
  Administered 2014-07-14 (×2): via ORAL

## 2014-07-14 MED FILL — IBUPROFEN 400 MG TAB: 400 mg | ORAL | Qty: 2

## 2014-07-14 MED FILL — CIPROFLOXACIN IN D5W 400 MG/200 ML IV PIGGY BACK: 400 mg/200 mL | INTRAVENOUS | Qty: 200

## 2014-07-14 MED FILL — HYDROMORPHONE 2 MG TAB: 2 mg | ORAL | Qty: 2

## 2014-07-14 MED FILL — HYDROMORPHONE (PF) 1 MG/ML IJ SOLN: 1 mg/mL | INTRAMUSCULAR | Qty: 1

## 2014-07-14 MED FILL — HYDROCODONE-ACETAMINOPHEN 5 MG-325 MG TAB: 5-325 mg | ORAL | Qty: 1

## 2014-07-14 MED FILL — CLONAZEPAM 1 MG TAB: 1 mg | ORAL | Qty: 1

## 2014-07-14 MED FILL — BD POSIFLUSH NORMAL SALINE 0.9 % INJECTION SYRINGE: INTRAMUSCULAR | Qty: 10

## 2014-07-14 MED FILL — OXYCODONE-ACETAMINOPHEN 5 MG-325 MG TAB: 5-325 mg | ORAL | Qty: 1

## 2014-07-14 MED FILL — BD POSIFLUSH NORMAL SALINE 0.9 % INJECTION SYRINGE: INTRAMUSCULAR | Qty: 20

## 2014-07-14 MED FILL — ONDANSETRON (PF) 4 MG/2 ML INJECTION: 4 mg/2 mL | INTRAMUSCULAR | Qty: 2

## 2014-07-14 NOTE — ED Progress Note (Signed)
Patient resting and without complaints. Continue pain medications ; reassess in AM

## 2014-07-14 NOTE — ED Notes (Signed)
TRANSFER - OUT REPORT:    Verbal [facility]name[facility] on [name]  being transferred to 4 west (4207(unit) for routine progression of care       Report consisted of patient???s Situation, Background, Assessment and   Recommendations(SBAR).     Information from the following report(s) SBAR was reviewed with the receiving nurse.    Lines:   Peripheral IV 07/13/14 Right Antecubital (Active)   Site Assessment Clean, dry, & intact 07/13/2014  8:00 PM   Phlebitis Assessment 0 07/13/2014  8:00 PM   Infiltration Assessment 0 07/13/2014  8:00 PM   Dressing Status Clean, dry, & intact;Occlusive 07/13/2014  8:00 PM   Hub Color/Line Status Pink 07/13/2014  8:00 PM        Opportunity for questions and clarification was provided.      Patient transported with:   Registered Nurse

## 2014-07-14 NOTE — H&P (Signed)
[facility]  History and Physical  NAME:  [name], [name]  SEX:   F  ADMIT: 07/13/2014  DOB:06-04-1970  MR#    [id number]  ROOM:  [id number]  ACCT#  [id number]    I hereby certify this patient for admission based upon medical necessity as   noted below:    <    CHIEF COMPLAINT:   Both ear pain for 4 or 5 days.    HISTORY OF PRESENT ILLNESS:  The patient is a 44 year old female with no significant medical problems who   was in her usual health until 3 to 4 days ago when she started having earache.    Initially the left one and then right, kept getting worse. Rated pain is 10   out of 10, associated with decreased hearing.  She was seen in the emergency   room, she was prescribed some eardrops and antibiotics which she took for a   couple of days and then improved. She came back the day before yesterday and   she was admitted for observation, started on IV antibiotics and IV analgesics   but her condition was not improving, so it was decided that she should get   admitted.    REVIEW OF SYSTEMS:  Significant for some feverish feeling weak and staying nervous.  Review of all   other systems was negative, 12+ review of systems was done.    PAST MEDICAL HISTORY:  Significant for anxiety disorder.    PAST SURGICAL HISTORY:  Significant for back surgery and hysterectomy.    FAMILY HISTORY:  Positive for lung cancer in mother.  Father had colon cancer.    PERSONAL AND SOCIAL HISTORY:  The patient is married.  No history of tobacco use or alcohol use.  Lives with   her husband.    CURRENT MEDICATIONS:  The patient takes Klonopin up to 3 times a day.    ALLERGIES:  CODEINE AND ERYTHROMYCIN.    PHYSICAL EXAMINATION:  GENERAL:  Showed a middle-aged female, well built, who was in severe distress   due to pain.  VITAL SIGNS:  Stable.  HEENT:  Normocephalic, atraumatic. NECK:  Supple, no neck vein distention. No   carotid bruits.  No thyromegaly. Ear exam showed some erythema of the external    auditory meatus on both sides.  The patient had some tenderness over the left   mastoid area on the left side.  No cervical lymphadenopathy.  CHEST:  Symmetrical, normal expansion.  HEART:  Normal S1 and S2.  Regular rate and rhythm.  LUNGS:  Clear.  ABDOMEN:  Soft, nontender, bowel sounds audible.  NEUROLOGIC:  The patient is awake, alert.    PERTINENT LABS:  CBC was normal.  Chemistry was normal.  CT showed some soft tissue thickening   of both external auditory canals suggesting otitis externa.    ASSESSMENT:  1.  Severe otitis externa, bilateral with possible otitis media.  2.  Anxiety disorder.  3.  Obesity.    MANAGEMENT PLAN:  The patient being admitted to regular floor.  She will be continued on IV   antibiotics.  For pain control, the patient will be given Dilaudid alternate   with ibuprofen.  Risks and benefits of the medications were explained to the   patient.  If the patient shows no improvement in the next 24 to 48 hours will   consult ENT.      ___________________  Hazel Sams MD  Dictated By: .   [facility]  D:07/14/2014 13:04:53  T: 07/14/2014 13:50:12  [id number]

## 2014-07-14 NOTE — ED Notes (Signed)
Patient just given IBUPROFEN and is asking for her Percocet and/or Dilaudid

## 2014-07-14 NOTE — Other (Signed)
Bedside and Verbal shift change report given to Philis FendtMickeya Hayes, RN  (oncoming nurse) by Wendie SimmerSarah Service, RN  (offgoing nurse). Report included the following information SBAR, Kardex and MAR.

## 2014-07-14 NOTE — ED Notes (Signed)
Requesting for pain med.Taking po fluids.

## 2014-07-14 NOTE — Discharge Summary (Signed)
[facility]  [facility]charge Summary  NAME:  [name] RegisterDUMAS, [name]  SEX:   F  DOB: [birth year]  MR#    [id number]  ROOM:  [id number]  ACCT#  [id number]        I hereby certify this patient for admission based upon medical necessity as   noted below:    PRIMARY CARE PHYSICIAN:  None.    DATE AND TIME OF ASSIGNMENT TO THE OBSERVATION UNIT:    07/13/2014 at 1407.    DATE AND TIME OF DISPOSITION:  07/14/2014 at 11:00 a.m.    ASSIGNMENT DIAGNOSES:  1.  Bilateral infective otitis externa.  2.  Parotiditis.    DISPOSITION DIAGNOSES:  1.  Infective bilateral otitis externa.  2.  Parotiditis.    HISTORY OF PRESENT ILLNESS:  The patient came in with a couple of days of pain and swelling in both ears,   left greater than right.  She has been placed on Cortisporin and had   persistent discomfort, came back, had ear wicks placed which fell out and then   came back and was placed in our observation unit for IV and topical   antibiotics.    COURSE IN THE OBSERVATION UNIT:  The patient did not develop any fevers or chills.  Her blood pressure and   vitals remained stable.  She was not vomiting.  She did have poor pain control   with Dilaudid, was started on hydrocodone which helped minimally.  When I   evaluated this morning, she told me that it felt like the medication, the   Dilaudid, was wearing off after about an hour and a half, she feels like her   swelling and pain are the same as when she came in yesterday.  She is not   feeling any improvement.  She is swallowing okay.  She discussed with me that   oxycodone seems to work better for her when she has pain issues.  She also   requested being on ibuprofen and told me that she is supposed to be taking   Klonopin as her daily medication at home 3 times a day and her stay here in   the hospital has increased her anxiety.  When I was evaluating her with her   not feeling improvement, I spoke with Dr. Sherlon Handingomash about it.  He was over to see    her and at this time thinks that she will need further antibiotics and   possibly ENT consultation.  We are going to place her in the hospital to   continue her IV antibiotics and pain control.    EXAMINATION AT DISPOSITION:  VITAL SIGNS:  Most recent vital signs show a  blood pressure of 110/60, heart   rate 89, respiratory rate 18, temperature 97.7, 100% on room air.  HEENT:  Eyes:  Conjunctivae clear, lids normal.  Pupils equal, symmetrical,   and normally reactive.   Mouth/Throat:  Surfaces of the pharynx, palate, and   tongue are pink, moist, and without lesions.   Ears:  The patient's left and   right ears both are swollen, left greater than right.  They are both painful   to move the pinna and tragus.  Both canals are patent with a visible TM;   however, the right seems a little bit more closed and then the left ear wick   appears to have fallen out.  She has adenopathy around her ears.  She has some   swelling of the parotid  area on her left greater than right.  Her airway is   patent.  RESPIRATORY:  Clear and equal breath sounds.  No respiratory distress,   tachypnea, or accessory muscle use.  CARDIOVASCULAR:   Heart regular, without murmurs, gallops, rubs, or thrills.  NEUROLOGIC:  Alert, oriented.  Sensation intact, motor strength equal and   symmetric.  PSYCHIATRIC:  Recent and remote memory appears to be intact.    DISPOSITION:  The patient at this point has been discussed with Dr. Tasia Catchings by Dr. Sherlon Handing.  He   is going to admit her to continue antibiotic therapy.    COURSE IN THE OBSERVATION UNIT:  The patient was getting Ciprodex drops as well as IV Cipro q.12h.      ___________________  Wynelle Bourgeois MD  Dictated ZO:XWRU Bradd Burner, PA    My signature above authenticates this document and my orders, the final  diagnosis(es), discharge prescription(s) and instructions in the [facility] record.  If you have any questions please contact [phone].   Nursing notes have been reviewed by the physician/mid-level provider.    If you have any questions please contact [phone].    SB  D:07/14/2014 11:14:15  T: 07/14/2014 11:56:44  [id number]

## 2014-07-14 NOTE — ED Notes (Signed)
[facility] BEDSIDE_VERBAL_RECORDED_WRITTEN:19080::"Bedside"} shift change report given to LAURIE RN (oncoming nurse) by Marcelino DusterMICHELLE RN (offgoing nurse). Report included the following information SBAR and Kardex.

## 2014-07-14 NOTE — Other (Signed)
Received report from Charge nurse Angelique Blonderenise, RN on pt.

## 2014-07-15 MED ORDER — PROMETHAZINE 25 MG TAB
25 mg | Freq: Four times a day (QID) | ORAL | Status: DC | PRN
Start: 2014-07-15 — End: 2014-07-16
  Administered 2014-07-15 – 2014-07-16 (×4): via ORAL

## 2014-07-15 MED FILL — IBUPROFEN 400 MG TAB: 400 mg | ORAL | Qty: 2

## 2014-07-15 MED FILL — ONDANSETRON (PF) 4 MG/2 ML INJECTION: 4 mg/2 mL | INTRAMUSCULAR | Qty: 2

## 2014-07-15 MED FILL — HYDROMORPHONE 2 MG TAB: 2 mg | ORAL | Qty: 2

## 2014-07-15 MED FILL — CLONAZEPAM 1 MG TAB: 1 mg | ORAL | Qty: 1

## 2014-07-15 MED FILL — RANITIDINE 150 MG TAB: 150 mg | ORAL | Qty: 1

## 2014-07-15 MED FILL — CIPROFLOXACIN IN D5W 400 MG/200 ML IV PIGGY BACK: 400 mg/200 mL | INTRAVENOUS | Qty: 200

## 2014-07-15 MED FILL — PROMETHAZINE 25 MG TAB: 25 mg | ORAL | Qty: 1

## 2014-07-15 NOTE — Other (Signed)
Bedside shift change report given to Karena, RN (oncoming nurse) by Richard, RN (offgoing nurse). Report included the following information SBAR.

## 2014-07-15 NOTE — Nurse Consult (Signed)
This pt has no chronic health conditions and is here for bilat ear infections. I gave [facility] the [facility] schedule and spent quite a bit of time explaining it to her and how to establish herself with them. She verbalizes understanding.

## 2014-07-15 NOTE — Other (Addendum)
Report received, orders reviewed. Assessment complete per flowsheet. breath sounds clear and equal bilaterally. Respirations even and unlabored.  Apical heartbeat regular.  radial pulses present on right and left.  Pedal pulses present bilaterally, abd soft and round. bowel sounds active x4. No apparent distress noted . bed wheels locked in low position. Will continue to monitor.  MESSAGE: Ms [name] [phone] has nausea that is not satisfied with Zofran. May we get Phenergan for her nausea? Richard     Phenergan administrated.  Pt nausea decreased.    [id number]   MESSAGE: Ms [name] [phone] Reports pain 10/10 Left Ear. States: is there anything you can give her for increased pain because her Q6PRN med is not due for a long time? Richard [phone]    [phone]   MESSAGE: [phone] MESSAGE: Ms [name] [phone] Reports pain 10/10 Left Ear. States: is there anything you can give her for increased pain because her Q6PRN med is not due for a long time? at 2010. Richard [phone]

## 2014-07-15 NOTE — Progress Notes (Signed)
Progress Note    Patient: [name]   Age:  43 y.o.  DOA: 07/13/2014   Admit Dx / CC: acute bilateral otitis externa, parotiditis  Parotiditis  Otitis externa  LOS:  LOS: 1 day     Active Problems   Active Problems:    Parotiditis (07/14/2014)      Otitis externa (07/14/2014)        Additional Assessments   1.?? Severe otitis externa, bilateral with possible otitis media.  2.?? Anxiety disorder.  3.?? Obesity.    Plan:   The patient being admitted to regular floor.?? She will be continued on IV `antibiotics.?? For pain control, the patient will be continued on Dilaudid PO alternate ??  with ibuprofen.?? Possible discharge in AM.     Subjective:   Better than yesterday - Right ear hurts more than Left Ear today -     Objective:     Visit Vitals   Item Reading   ??? BP 105/59 mmHg   ??? Pulse 74   ??? Temp 98 ??F (36.7 ??C)   ??? Resp 16   ??? SpO2 100%       Physical Exam:   General appearance: alert, cooperative, mild distress, appears stated age  Head: Normocephalic, without obvious abnormality, atraumatic  Neck: supple, trachea midline  Lungs: CTA bilaterally  Heart: RRR, S1, S2 normal, no murmur, click, rub or gallop  Abdomen: soft, non-tender. Bowel sounds Audible, No masses,  no organomegaly  Extremities: extremities normal, atraumatic, no cyanosis or edema  Skin: Skin color, texture, turgor normal. No rashes or lesions  Neurologic: Grossly normal  Ears: Right Auditory Canal-Drainage and puss worse than Left    Intake and Output:   Current Shift:     Last three shifts:  04/23 1901 - 04/25 0700  In: 560 [P.O.:360; I.V.:200]  Out: -     Labs/Data Reviewed:     Recent Results (from the past 48 hour(s))   CREATININE    Collection Time: 07/13/14 11:15 AM   Result Value Ref Range    Creatinine 0.7 0.6 - 1.3 mg/dl    GFR est AA >52.8      GFR est non-AA >60     CBC WITH AUTOMATED DIFF    Collection Time: 07/13/14 11:25 AM   Result Value Ref Range    WBC 9.9 4.0 - 11.0 1000/mm3    RBC 4.25 3.60 - 5.20 M/uL     HGB 12.5 (L) 13.0 - 17.2 gm/dl    HCT 41.3 24.4 - 01.0 %    MCV 88.0 80.0 - 98.0 fL    MCH 29.4 25.4 - 34.6 pg    MCHC 33.4 30.0 - 36.0 gm/dl    PLATELET 272 536 - 644 1000/mm3    MPV 10.4 (H) 6.0 - 10.0 fL    RDW-SD 40.2 36.4 - 46.3      NRBC 0 0 - 0      IMMATURE GRANULOCYTES 0.5 0.0 - 3.0 %    NEUTROPHILS 70.5 (H) 34 - 64 %    LYMPHOCYTES 14.8 (L) 28 - 48 %    MONOCYTES 13.0 1 - 13 %    EOSINOPHILS 1.0 0 - 5 %    BASOPHILS 0.2 0 - 3 %   METABOLIC PANEL, BASIC    Collection Time: 07/13/14 11:25 AM   Result Value Ref Range    Sodium 136 136 - 145 mEq/L    Potassium 3.9 3.5 - 5.1 mEq/L  Chloride 103 98 - 107 mEq/L    CO2 25 21 - 32 mEq/L    Glucose 104 74 - 106 mg/dl    BUN 7 7 - 25 mg/dl    Creatinine 0.7 0.6 - 1.3 mg/dl    GFR est AA >16.1>60.0      GFR est non-AA >60      Calcium 9.3 8.5 - 10.1 mg/dl   POC URINE MACROSCOPIC    Collection Time: 07/13/14 12:06 PM   Result Value Ref Range    Glucose Negative NEGATIVE,Negative mg/dl    Bilirubin Negative NEGATIVE,Negative      Ketone Negative NEGATIVE,Negative mg/dl    Specific gravity 0.9601.010 1.005 - 1.030      Blood Negative NEGATIVE,Negative      pH (UA) 6.0 5 - 9      Protein Negative NEGATIVE,Negative mg/dl    Urobilinogen 0.2 0.0 - 1.0 EU/dl    Nitrites Negative NEGATIVE,Negative      Leukocyte Esterase Negative NEGATIVE,Negative      Color Yellow      Appearance Clear     POC HCG,URINE    Collection Time: 07/13/14 12:13 PM   Result Value Ref Range    HCG urine, Ql. negative NEGATIVE,Negative,negative     CREATININE    Collection Time: 07/14/14  5:13 AM   Result Value Ref Range    Creatinine 0.7 0.6 - 1.3 mg/dl    GFR est AA >45.4>60.0      GFR est non-AA >60         Imaging:   No results found.    Medication Reviewed:     Current Facility-Administered Medications   Medication Dose Route Frequency   ??? ibuprofen (MOTRIN) tablet 800 mg  800 mg Oral Q8H PRN   ??? clonazePAM (KlonoPIN) tablet 1 mg  1 mg Oral TID   ??? naloxone (NARCAN) injection 0.1 mg  0.1 mg IntraVENous PRN    ??? HYDROmorphone (DILAUDID) tablet 4 mg  4 mg Oral Q6H PRN   ??? ibuprofen (MOTRIN) tablet 800 mg  800 mg Oral TID   ??? ranitidine (ZANTAC) tablet 150 mg  150 mg Oral BID   ??? sodium chloride (NS) flush 5-10 mL  5-10 mL IntraVENous Q8H   ??? sodium chloride (NS) flush 5-10 mL  5-10 mL IntraVENous PRN   ??? ondansetron (ZOFRAN) injection 4 mg  4 mg IntraVENous Q6H PRN   ??? ciprofloxacin-dexamethasone (CIPRODEX) 0.3-0.1 % otic suspension 4 Drop  4 Drop Both Ears BID   ??? ciprofloxacin (CIPRO) 400 mg IVPB (premix)  400 mg IntraVENous Q12H         Progress Note written by Ambrose PancoastMaxwell Wagner (scribe), reviewed and edited by Hazel Samsahir Marti Acebo M.D.   Ambrose PancoastMaxwell Wagner  P# [phone]  July 15, 2014

## 2014-07-15 NOTE — Progress Notes (Signed)
Problem: Falls - Risk of  Goal: *Absence of falls  Outcome: Progressing Towards Goal  Pt has remained free of falls throughout the night. Fall precautions in place. Continue to implement interventions and monitor.

## 2014-07-15 NOTE — Progress Notes (Signed)
D/C Plan Home - No needs at this time; patients husband is here ae inpatient she plans to stay with him until discharge.

## 2014-07-15 NOTE — Progress Notes (Addendum)
Life Coach attempted to make contact with the patient however the number listed for the patient is not in service.  Life Coach will attempt to locate patient's email address in order to make contact.  Life Coach saw the patient earlier this month and provided the patient with an appointment.  Patient has been provided with an abundance of resources to utilize however the patient continuously visits the ER instead.  Life Coach even provided the patient with the care a [name] schedule (this resource renders care FREE of charge for the uninsured) and offered to render transportation if needed.

## 2014-07-16 MED ORDER — CIPROFLOXACIN 500 MG TAB
500 mg | ORAL_TABLET | Freq: Two times a day (BID) | ORAL | Status: DC
Start: 2014-07-16 — End: 2014-08-25

## 2014-07-16 MED ORDER — OXYCODONE-ACETAMINOPHEN 10 MG-325 MG TAB
10-325 mg | ORAL_TABLET | Freq: Three times a day (TID) | ORAL | Status: DC | PRN
Start: 2014-07-16 — End: 2014-07-21

## 2014-07-16 MED ORDER — IBUPROFEN 800 MG TAB
800 mg | ORAL_TABLET | Freq: Three times a day (TID) | ORAL | Status: DC | PRN
Start: 2014-07-16 — End: 2014-07-21

## 2014-07-16 MED ORDER — RANITIDINE 150 MG TAB
150 mg | ORAL_TABLET | Freq: Two times a day (BID) | ORAL | Status: DC
Start: 2014-07-16 — End: 2014-09-12

## 2014-07-16 MED FILL — HYDROMORPHONE 2 MG TAB: 2 mg | ORAL | Qty: 2

## 2014-07-16 MED FILL — PROMETHAZINE 25 MG TAB: 25 mg | ORAL | Qty: 1

## 2014-07-16 MED FILL — CLONAZEPAM 1 MG TAB: 1 mg | ORAL | Qty: 1

## 2014-07-16 MED FILL — IBUPROFEN 400 MG TAB: 400 mg | ORAL | Qty: 2

## 2014-07-16 MED FILL — CIPROFLOXACIN IN D5W 400 MG/200 ML IV PIGGY BACK: 400 mg/200 mL | INTRAVENOUS | Qty: 200

## 2014-07-16 MED FILL — RANITIDINE 150 MG TAB: 150 mg | ORAL | Qty: 1

## 2014-07-16 NOTE — Discharge Summary (Signed)
Discharge Summary    Patient: [name]               Sex: female          DOA: 07/13/2014         Date of Birth:  Jan 22, 1971      Age:  44 y.o.        LOS:  LOS: 2 days                Admit Date: 07/13/2014    Discharge Date: 07/16/2014    Admission Diagnoses: acute bilateral otitis externa, parotiditis  Parotiditis  Otitis externa  Parotiditis    Discharge Diagnoses:     1.?? Severe otitis externa, bilateral with possible otitis media.  2.?? Anxiety disorder.  3.?? Obesity.    Discharge Medications:     Current Discharge Medication List      START taking these medications    Details   ibuprofen (MOTRIN) 800 mg tablet Take 1 Tab by mouth every eight (8) hours as needed.  Qty: 60 Tab, Refills: 0      ranitidine (ZANTAC) 150 mg tablet Take 1 Tab by mouth two (2) times a day.  Qty: 60 Tab, Refills: 0      ciprofloxacin HCl (CIPRO) 500 mg tablet Take 1.5 Tabs by mouth two (2) times a day.  Qty: 28 Tab, Refills: 0      oxyCODONE-acetaminophen (PERCOCET) 10-325 mg per tablet Take 1 Tab by mouth every eight (8) hours as needed for Pain. Max Daily Amount: 3 Tabs.  Qty: 15 Tab, Refills: 0         CONTINUE these medications which have NOT CHANGED    Details   clonazePAM (KLONOPIN) 1 mg tablet Take 1 mg by mouth three (3) times daily.             Follow-up: Free-Clinic 1-week  Disposition: Home  Discharge Condition: Good  Activity: Activity as tolerated  Diet: Regular Diet  Labs:  Labs: Results:   Chemistry Recent Labs      07/14/14   0513  07/13/14   1125  07/13/14   1115   GLU   --   104   --    NA   --   136   --  K   --   3.9   --    CL   --   103   --    CO2   --   25   --    BUN   --   7   --    CREA  0.7  0.7  0.7   CA   --   9.3   --       CBC w/Diff Lab Results   Component Value Date/Time    WBC 9.9 07/13/2014 11:25 AM    HGB 12.5 07/13/2014 11:25 AM    HCT 37.4 07/13/2014 11:25 AM    PLATELET 264 07/13/2014 11:25 AM    MCV 88.0 07/13/2014 11:25 AM        Imaging/Procedures:   CT Results (most recent):     Results from Hospital Encounter encounter on 07/13/14   CT MAXILLOFACIAL WO CONT   Narrative History: Bilateral otitis externa and edema; question mastoiditis    3 mm axial images obtained through the facial and temporal bones without  contrast and reconstructed in sagittal and coronal planes.    FINDINGS: Bilateral soft tissue thickening is seen occluding both external  auditory canals. The mastoid air cells remain normally aerated however. There is  abnormal soft tissue density in the left presacral space adjacent to the  ossicles with similar but more extensive density surrounding the ossicles on the  right in the middle ear cavity. No definite ossicular erosion. Inner ear  structures unremarkable. Visualized portions of the brain normal. Orbits and  paranasal sinuses remarkable only for mild mucous retention cyst versus polyp  formation base of right maxillary sinus.    Borderline enlarged lymph nodes are seen bilaterally in the parotid glands with  generalized stranding and mild prominence of the glands. Submandibular glands  unremarkable. Enlarged jugulodigastric nodes bilaterally measuring short axis  diameter 12 mm on the left and 13 mm on the right. Additional normal to upper  normal size anterior and posterior triangle nodes seen bilaterally.         Impression IMPRESSION:  1. Cervical adenopathy.  2. Abnormal soft tissue thickening both external auditory canals suggesting  otitis externa occluding the canals themselves. Abnormal soft tissue density  both middle ear cavities which may represent chronic otitis media.  3. Borderline size intraparotid nodes with fat stranding that could indicate  parotiditis.        [place] Results (most recent):    Results from Hospital Encounter encounter on 07/10/14   US TRANSVAGINAL   Narrative Clinical history: Pelvic pain, right greater than left    EXAMINATION: Endovaginal pelvic ultrasound 07/10/2014    Correlation: 10/28/2013    FINDINGS:   Uterus is absent compatible with hysterectomy. The right or left ovaries are not  visualized.         Impression IMPRESSION:  Hysterectomy.        No results found for this or any previous visit.    Treatment Team: Treatment Team: Attending Provider: Hazel Sams, MD; Consulting Provider: Hazel Sams, MD; Care Manager: Josefina Do, [facility] Course:     The pt is a 44 y.o. female that presented to the ED with bilateral ear pain for several days - she was admitted to regular floor for Otitis Externa and placed on IV-Cipro. For pain control, the patient was given Dilaudid PO alternated with ibuprofen. The pt responded well to treatment and did not have any acute events during her stay. On the day of discharge the pt was feeling better - her ear pain had improved in both ears, and she wished to go home. The pt will be discharged home and will need to complete her Cipro use her Ear drops, NSAIDs, and Percocet as directed.     Discharge Summary written by Ambrose Pancoast (scribe), reviewed and edited by Hazel Sams M.D.   Ambrose Pancoast  July 16, 2014        Total time spent: Over 30 min.

## 2014-07-16 NOTE — Progress Notes (Signed)
Problem: Falls - Risk of  Goal: *Absence of falls  Outcome: Progressing Towards Goal  Patient remains free of falls at this time. Fall precautions are in place. Will continue to monitor.

## 2014-07-16 NOTE — Progress Notes (Signed)
Life Coach attempted to make contact with patient in order to remind of upcoming appointment however the patient's number was not in service.  Life Coach will email the patient a reminder.

## 2014-07-17 NOTE — Progress Notes (Signed)
Life Coach made contact with the facility and it was confirmed that the patient was not in attendance for scheduled appointment once again. Life Coach has attempted to work with the patient on SEVERAL occasions in the past.  Patient is basically non-compliant and renders numerous excuses for not attending scheduled appointment and not even having the common courtesy to call and cancel.  Patient is fully aware of the FREE services rendered through the [facility] as well as other resources readily available at very low cost that alleviates the debt that is accumulated through ER visits.

## 2014-07-21 ENCOUNTER — Inpatient Hospital Stay
Admit: 2014-07-21 | Discharge: 2014-07-21 | Disposition: A | Discharge status: Home / Self Care | Payer: Self-pay | Attending: Emergency Medicine

## 2014-07-21 DIAGNOSIS — H6093 Unspecified otitis externa, bilateral: Secondary | ICD-10-CM

## 2014-07-21 MED ORDER — IBUPROFEN 600 MG TAB
600 mg | ORAL_TABLET | Freq: Four times a day (QID) | ORAL | Status: DC | PRN
Start: 2014-07-21 — End: 2014-08-24

## 2014-07-21 MED ORDER — KETOROLAC TROMETHAMINE 30 MG/ML INJECTION
30 mg/mL (1 mL) | INTRAMUSCULAR | Status: AC
Start: 2014-07-21 — End: 2014-07-21
  Administered 2014-07-21: 06:00:00 via INTRAMUSCULAR

## 2014-07-21 MED ORDER — OXYCODONE-ACETAMINOPHEN 5 MG-325 MG TAB
5-325 mg | ORAL_TABLET | ORAL | Status: DC | PRN
Start: 2014-07-21 — End: 2014-08-25

## 2014-07-21 MED FILL — KETOROLAC TROMETHAMINE 30 MG/ML INJECTION: 30 mg/mL (1 mL) | INTRAMUSCULAR | Qty: 2

## 2014-07-21 NOTE — ED Notes (Signed)
Patient was admitted last Monday through Wednesday for bilateral ear infection and started on IV antibiotics. Patient finished prescription cipro PO on Friday and took last narcotic pain medication on Saturday morning. Has an appt with transitional care on Monday. Took motrin 600mg  and Tylenol 975mg  at 2030.

## 2014-07-21 NOTE — ED Notes (Signed)
2:07 AM  07/21/2014     Discharge instructions given to patient (name) with verbalization of understanding. Patient accompanied by spouse.  Patient discharged with the following prescriptions percocet and motrin. Patient discharged to home (destination).      CALLIE ANN COVIELLO, RN

## 2014-07-21 NOTE — ED Provider Notes (Signed)
[facility]  EMERGENCY DEPARTMENT TREATMENT REPORT  NAME:  [name], [name]  SEX:   F  ADMIT: 07/21/2014  DOB:   1970-05-03  MR#    [id number]  ROOM:  [id number]  TIME DICTATED: 02 37 AM  ACCT#  [id number]        TIME OF SERVICE:   0106.    CHIEF COMPLAINT:  Ear  pain.    HISTORY OF PRESENT ILLNESS:  The patient is a 44 year old female who was just recently discharged from the   hospital April 26  for extensive bilateral otitis externa.  She was treated   inpatient with IV antibiotics and oral Dilaudid.  She was sent home with   Percocet and oral ciprofloxacin, which she has been taking as prescribed.  She   does run out of her Percocet yesterday.  She has been having increased   bilateral ear pain since yesterday.  Denies any drainage from the ear.  Denies   any associated fever, chills, nausea, vomiting or any other medical   complaints.    REVIEW OF SYSTEMS:  CONSTITUTIONAL:  No fever or chills.  ENT:  Bilateral ear pain.  GASTROINTESTINAL:  No nausea, vomiting.  NEUROLOGIC:  Denies headache.    PAST MEDICAL HISTORY:  History of ADHD, hypertension, anxiety and chronic back pain.    CURRENT MEDICATIONS:  Reviewed on Epic.      ALLERGIES:  CODEINE, ERYTHROMYCIN.    SOCIAL HISTORY:  Denies any use of tobacco, notes occasional alcohol use.  Denies illicit drug   use.    PHYSICAL EXAMINATION:  VITAL SIGNS:  Blood pressure is 138/97, pulse 97, respirations 20, temperature   98.3, O2 saturation 100% on room air.  GENERAL APPEARANCE:  The patient appears well developed, well nourished.  She   is alert.  EYES:  Conjunctivae clear, lids normal.  Pupils equal, symmetrical, and   normally reactive.   ENT:  Ears:  Hearing is grossly intact to voice.  External examination of ears   bilaterally reveal some tenderness to palpation to the tragus and the auricle   bilaterally.  Internal exam of the right ear reveals mild erythema to the   auditory canal but otherwise no edema, unable to visualize the tympanic    membrane which is intact, pearly white with good light reflex.  No exudative   fluid behind the TM noted.  Internal examination of left ear reveals some mild   edema of the auditory canal and associated erythema.  I am able to visualize   the tympanic membrane which appears unremarkable.  Mouth/Throat:  Surfaces of the pharynx, palate, and tongue are pink, moist,   and without lesions. Nasal mucosa, septum, and turbinates unremarkable.   LYMPHATICS:  There is some mild pre and posterior auricular lymphadenopathy to   the left ear.  No other lymphadenopathy present.    INITIAL ASSESSMENT:  This is a well appearing 44 year old female here for evaluation of persistent   bilateral ear pain after diagnosis of bilateral otitis externa.  She is on   otic drops at home as well as oral ciprofloxin.  She states beforehand the   physician told her they could not see her ear drums.  I am able to see her   bilateral tympanic membranes.  Therefore, I feel that the antibiotics are   working effectively.  I think her main concern is pain control being she ran   out of the Percocet yesterday and since then has been having increased  pain.    I have offered an injection of Toradol here for pain and we will discharge her   home with four Percocet, enough to carry her over to Monday.  She will have   to follow up with her primary care physician for further pain management.  The   patient is agreeable to this plan.    FINAL DIAGNOSIS:  Bilateral otitis externa.    DISPOSITION:  The patient is stable for discharge home.  The patient to follow up with her   primary care physician on Monday for further evaluation and further pain   management as needed.  She is encouraged to continue current antibiotics,   return should she have any new or worsening symptoms. The patient was   personally evaluated by myself and Dr. Jimmey Ralph who agrees with the above   assessment and plan.      ___________________  Johny Drilling MD   Dictated By: Truddie Crumble. Cathlyn Parsons, PA-C    My signature above authenticates this document and my orders, the final   diagnosis (es), discharge prescription (s), and instructions in the Epic   record.  If you have any questions please contact [phone].    Nursing notes have been reviewed by the physician/ advanced practice   clinician.    FS  D:07/21/2014 02:37:39  T: 07/21/2014 10:24:03  [id number]

## 2014-08-24 ENCOUNTER — Inpatient Hospital Stay
Admit: 2014-08-24 | Discharge: 2014-08-24 | Disposition: A | Discharge status: Home / Self Care | Payer: Self-pay | Attending: Emergency Medicine

## 2014-08-24 ENCOUNTER — Inpatient Hospital Stay: Admit: 2014-08-24 | Discharge: 2014-08-24 | Payer: Self-pay | Attending: Emergency Medicine

## 2014-08-24 DIAGNOSIS — K029 Dental caries, unspecified: Secondary | ICD-10-CM

## 2014-08-24 DIAGNOSIS — K088 Other specified disorders of teeth and supporting structures: Secondary | ICD-10-CM

## 2014-08-24 MED ORDER — HYDROMORPHONE 2 MG TAB
2 mg | ORAL | Status: AC
Start: 2014-08-24 — End: 2014-08-24
  Administered 2014-08-24: 09:00:00 via ORAL

## 2014-08-24 MED ORDER — KETOROLAC TROMETHAMINE 15 MG/ML INJECTION
15 mg/mL | INTRAMUSCULAR | Status: DC
Start: 2014-08-24 — End: 2014-08-24

## 2014-08-24 MED ORDER — OXYCODONE-ACETAMINOPHEN 5 MG-325 MG TAB
5-325 mg | ORAL | Status: AC
Start: 2014-08-24 — End: 2014-08-24
  Administered 2014-08-24: 22:00:00 via ORAL

## 2014-08-24 MED ORDER — IBUPROFEN 800 MG TAB
800 mg | ORAL_TABLET | Freq: Four times a day (QID) | ORAL | Status: DC | PRN
Start: 2014-08-24 — End: 2014-08-25

## 2014-08-24 MED ORDER — KETOROLAC TROMETHAMINE 30 MG/ML INJECTION
30 mg/mL (1 mL) | INTRAMUSCULAR | Status: AC
Start: 2014-08-24 — End: 2014-08-24
  Administered 2014-08-24: 09:00:00 via INTRAMUSCULAR

## 2014-08-24 MED ORDER — KETOROLAC TROMETHAMINE 30 MG/ML INJECTION
30 mg/mL (1 mL) | INTRAMUSCULAR | Status: AC
Start: 2014-08-24 — End: 2014-08-24
  Administered 2014-08-24: 22:00:00 via INTRAVENOUS

## 2014-08-24 MED ORDER — KETOROLAC TROMETHAMINE 30 MG/ML INJECTION
30 mg/mL (1 mL) | INTRAMUSCULAR | Status: DC
Start: 2014-08-24 — End: 2014-08-24

## 2014-08-24 MED FILL — HYDROMORPHONE 2 MG TAB: 2 mg | ORAL | Qty: 1

## 2014-08-24 MED FILL — KETOROLAC TROMETHAMINE 30 MG/ML INJECTION: 30 mg/mL (1 mL) | INTRAMUSCULAR | Qty: 2

## 2014-08-24 MED FILL — OXYCODONE-ACETAMINOPHEN 5 MG-325 MG TAB: 5-325 mg | ORAL | Qty: 1

## 2014-08-24 NOTE — ED Provider Notes (Signed)
[facility]  EMERGENCY DEPARTMENT TREATMENT REPORT  NAME:  [name], [name]  SEX:   F  ADMIT: 08/24/2014  DOB:   09-29-1970  MR#    [id number]  ROOM:  [id number]  TIME DICTATED: 06 14 AM  ACCT#  [id number]        TIME OF EVALUATION:  1191    PRIMARY CARE PROVIDER:  Unknown.    CHIEF COMPLAINT:  Dental pain.    HISTORY OF PRESENT ILLNESS:  This is a 44 year old female presenting to the Emergency Department today   secondary to dental pain.  The patient states that her husband is upstairs   pending surgery later today.  She was trying to rest in the hospital with him   and could sleep secondary to left-sided dental pain.  She tried taking   Tylenol, ibuprofen, applying ice, using Orajel, all without relief and due to   persistence of pain,  she is now here in the Emergency Department for further   evaluation.    REVIEW OF SYSTEMS:  CONSTITUTIONAL:  No fever.  ENT:  Positive for dental pain.  RESPIRATORY:  No shortness of breath.  CARDIOVASCULAR:  No chest pain.    PAST MEDICAL HISTORY:  Hypertension, anxiety, chronic back pain, ADHD.    SOCIAL HISTORY:  Denies tobacco use.    FAMILY HISTORY:  Noncontributory.    MEDICATIONS:  Reviewed.    ALLERGIES:  REVIEWED.    PHYSICAL EXAMINATION:  VITAL SIGNS:  Temperature 98.1, pulse 87, respirations 16, O2 sats 99% on room   air.  GENERAL:  The patient well nourished, well developed, answering questions   appropriately, in no distress.  ENT:  The patient with fair dentition.  She does have noted dental caries to   the left lower third molar where she is tender to palpation below the gumline.    There is no endurance or fluctuance, no trismus on evaluation.  Mucus   membranes are moist.  Internal and external examination of the ears   bilaterally unremarkable, TMs no bulging or erythema.    RESPIRATORY:  Clear and equal breath sounds.  No respiratory distress,   tachypnea, or accessory muscle use.    CARDIOVASCULAR:   Heart regular, without murmurs, gallops, rubs, or  thrills.    GASTROINTESTINAL:  Abdomen soft and nontender.    INITIAL ASSESSMENT AND MANAGEMENT PLAN:    This is a 44 year old female here secondary to dental pain.  There is no   evidence of any kind of an underlying abscess.  Based on dental pain policy,   the patient offered dental block while here in the Emergency Department, which   she did take.  This was done using a mixture of 1% lidocaine and 0.5%   bupivacaine.  The patient tolerating procedure well.  She some relief after   the procedure and I had finished her discharge paperwork.  Plan was to send   her home with ibuprofen.  She is already on Augmentin which she just started a   day ago secondary to a urinary tract infection.  Because she has just   recently started this, do feel that this would cover both organisms and she   was advised just to continue this medication and follow up with her dentist.    However, when nursing staff went to go take the paperwork to her, she was   crying and stating that she was still hurting and the dental block did not   seem to work.  Based on increased pain, she was given IM Toradol and an oral   tablet of Dilaudid.  She will not be sent home with any kind of narcotic   pills.  She was advised to follow up with a dentist.  Life coach referral   provided.  Return for any new or worsening symptoms.  The patient voices   understanding.    CLINICAL IMPRESSION AND DIAGNOSES:  1.  Dental caries.  2.  Dental pain.    DISPOSITION AND PLAN:  The patient discharged home with instructions above.  The patient was   personally evaluated by myself and Dr. Carmela HurtFickenscher, who agrees with the above   assessment and plan.      ___________________  Christiana PellantBen A Lyriq Finerty MD  Dictated By: Kristeen MansBrittany Irwin, PA    My signature above authenticates this document and my orders, the final   diagnosis (es), discharge prescription (s), and instructions in the Epic   record.  If you have any questions please contact [phone].     Nursing notes have been reviewed by the physician/ advanced practice   clinician.    MLT  D:08/24/2014 06:14:41  T: 08/24/2014 15:55:22  [id number]

## 2014-08-24 NOTE — ED Notes (Signed)
Dental pain. Left lower jaw

## 2014-08-24 NOTE — ED Notes (Signed)
I have reviewed discharge instructions with the patient.  The patient verbalized understanding.

## 2014-08-24 NOTE — ED Notes (Signed)
Left sided facial swelling with left lower toothache.  Sx started yesterday.  Denies fevers.

## 2014-08-25 ENCOUNTER — Inpatient Hospital Stay
Admit: 2014-08-25 | Discharge: 2014-08-25 | Disposition: A | Discharge status: Home / Self Care | Payer: Self-pay | Attending: Emergency Medicine

## 2014-08-25 DIAGNOSIS — R51 Headache: Secondary | ICD-10-CM

## 2014-08-25 MED ORDER — KETOROLAC TROMETHAMINE 30 MG/ML INJECTION
30 mg/mL (1 mL) | INTRAMUSCULAR | Status: AC
Start: 2014-08-25 — End: 2014-08-25
  Administered 2014-08-25: 16:00:00 via INTRAMUSCULAR

## 2014-08-25 MED ORDER — IBUPROFEN 600 MG TAB
600 mg | ORAL_TABLET | Freq: Four times a day (QID) | ORAL | Status: DC | PRN
Start: 2014-08-25 — End: 2014-09-12

## 2014-08-25 MED ORDER — OXYCODONE-ACETAMINOPHEN 5 MG-325 MG TAB
5-325 mg | ORAL | Status: AC
Start: 2014-08-25 — End: 2014-08-25
  Administered 2014-08-25: 16:00:00 via ORAL

## 2014-08-25 MED FILL — KETOROLAC TROMETHAMINE 30 MG/ML INJECTION: 30 mg/mL (1 mL) | INTRAMUSCULAR | Qty: 2

## 2014-08-25 MED FILL — OXYCODONE-ACETAMINOPHEN 5 MG-325 MG TAB: 5-325 mg | ORAL | Qty: 1

## 2014-08-25 NOTE — ED Notes (Signed)
Left ear pain and dental pain started 3 days ago

## 2014-08-25 NOTE — ED Provider Notes (Signed)
[name] REPORT  NAME:  [name], [name]  SEX:   F  ADMIT: 08/25/2014  DOB:   [birth year]  MR#    [id number]  ROOM:  [id number]  TIME DICTATED: 11 49 AM  ACCT#  [id number]    cc: [name] BenneGARFIELD [name] M.D.    PRIMARY CARE PHYSICIAN:  [name] BenneGarfield Samuels, MD    EVALUATION TIME:  10:25 a.m.    CHIEF COMPLAINT:  Dental pain, ear pain.    HISTORY OF PRESENT ILLNESS:  This is a 44 year old female who comes in stating over the last 2 to 3 days   she had pain in her left back lower  molar.  It has been a constant pain that   radiates into her jaw and underneath her jaw.  She has not had fevers or   chills.  She feels as though her face has been a bit swollen.  She has not   been able to see a dentist because it has been the weekend.  She has had 2   visits here.   She tells me on one occasion she was given Dilaudid and   Toradol.  On a different occasion was given Percocet.  She has been on   Augmentin for 4 days for a UTI, so she has not been placed on any new or   different antibiotics.   Today she comes back seeking evaluation because the   pain is now radiating up behind her left ear and into the ear.  She has a   history recently of acute otitis media bilaterally which she was in [name]   for, for a few days, getting IV antibiotics and she was concerned this could   also be redeveloping in addition to the fact that the pain has not been well   controlled.    REVIEW OF SYSTEMS:  CONSTITUTIONAL:  She has had no fever, no chill.  EYES:   No visual symptoms.   ENT:  Has had ear pain, dental pain.  RESPIRATORY:  No cough, shortness of breath, or wheezing.   NEUROLOGIC:  Left-sided headache.    PAST MEDICAL HISTORY:  ADHD, hypertension, anxiety, chronic back pain.    SOCIAL HISTORY:  She does not smoke.  Her husband is currently in the [name] here; has   recently had surgery here at this [name] and was admitted.    FAMILY HISTORY:  Not known.    ALLERGIES:  CODEINE AND ERYTHROMYCIN.     MEDICATIONS:  Augmentin and Klonopin.    PHYSICAL EXAMINATION:  GENERAL:  The patient is very anxious appearing.  VITAL SIGNS:  Blood pressure 149/100, pulse is 100, respiratory rate is 16,   temperature 98.3, 100% on room air, rating pain at a 10.  HEENT:  Eyes:  Pupils equal, symmetrical and normally reactive.  ENT:  I do   not see any facial swelling.  In her left back molar there is a dental caries   there but the gum is not red or inflamed.  There is no periapical abscess.    The patient can open and close the mouth without difficulty.  She is tender to   palpate along the jaw and up towards the ear.  The ear itself is not red or   swollen.  The canal is slightly swollen but it appears symmetric to the right   ear.  There is no drainage.  The TMs are both visible on both sides.  There is  no posterior or anterior auricular adenopathy.  There is no submandibular   adenopathy.  NEUROLOGIC:  Awake, alert, interacting appropriately.  She has a supple neck.      PSYCHIATRIC:   Appears anxious.    CONTINUATION BY [name] ICENBICE, PA-C:    ASSESSMENT AND MANAGEMENT PLAN:  This is a patient here primarily complaining of dental pain.  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  She is having pain radiate up to   the ear.  She does have a history of otitis externa, but I do not see evidence   of this.  There is no drainage from the ear.  There is no significant edema.    I can see her TM.  She has no pre or posterior auricular nodes.  She is   afebrile.  I think this is likely all radiating pain from her tooth.  At this   point, I have discussed that she really needs dental followup.  I do not think   she needs a change in her antibiotics.  Will offer Toradol.    COURSE IN THE EMERGENCY DEPARTMENT:  The patient was given IM Toradol.  Dr. Truddie [name] went to see the patient.  She    was still complaining of pain.  Advised that we would not be able to write   any prescriptions for home.  He did give her 1 Percocet here and discussed   with her that we would not be able to continue to give her parenteral and oral   medications for her dental pain, specifically narcotics should she return   back to this department for evaluation of this.  She is to call a dentist   tomorrow.    DIAGNOSIS:  Dental caries and pain with facial pain.      DISPOSITION:  Home.  The patient is discharged home in stable condition, with instructions   to follow up with their regular doctor.  They are advised to return   immediately for any worsening or symptoms of concern. Planning to follow up   with [name].    The patient was personally evaluated by myself and Dr. Truddie [name] who agrees   with the above assessment and plan.      ___________________  [name] Pronto MD  Dictated By: [name] Salvage, PA    My signature above authenticates this document and my orders, the final   diagnosis (es), discharge prescription (s), and instructions in the Epic   record.  If you have any questions please contact [phone].    Nursing notes have been reviewed by the physician/ advanced practice   clinician.    [name]  D:08/25/2014 11:49:19  T: 08/25/2014 23:07:49  [id number]

## 2014-08-25 NOTE — Other (Signed)
12:40 PM  08/25/2014     Discharge instructions given to patient (name) with verbalization of understanding. Patient accompanied by self.  Patient discharged with the following prescriptions motrin. Patient discharged to home (destination).      Clover MealyANYA FARINELLA, RN

## 2014-08-25 NOTE — ED Provider Notes (Signed)
[facility]  EMERGENCY DEPARTMENT TREATMENT REPORT  NAME:  Boeckman, Jenipher  SEX:   F  ADMIT: 08/24/2014  DOB:   [birth year]  MR#    [id number]  ROOM:  [id number]  TIME DICTATED: 11 27 AM  ACCT#  [id number]        TIME OF EVALUATION:  1700.    CHIEF COMPLAINT:  Dental pain.    HISTORY OF PRESENT ILLNESS:  A 44 year old female complaining of 10/10 throbbing pain to left lower back   molar since yesterday.  Does not recall how long the tooth has been fractured.    Has tried multiple over-the-counter treatments for pain including ibuprofen,   Tylenol, Orajel, ice, all without any relief.  Seen in this Emergency   Department for same complaint yesterday and given a dental block which she   states did not help at all.  Medicated with IM Toradol and oral Dilaudid which   did keep her pain free until this morning.  She was not prescribed any   medications for pain.  She is already on Augmentin, day 2 of 7 for treatment   of UTI, so no additional antibiotics were prescribed.  Returns today because   her pain has returned and she wants to be re-medicated with Toradol and   Dilaudid and would like pain medicine to go home with.  States she has no   where else to go because the dentist is not open on the weekend, so her only   option in the ER.  Does have chronic back pain for which she is prescribed   Percocet.  Denies being enrolled in pain management program.  Did run out of   her Percocet yesterday.    REVIEW OF SYSTEMS:  CONSTITUTIONAL:  No fever or chills.  ENT:  As above.  SKIN:  No facial swelling.    PAST MEDICAL HISTORY:  Hypertension, ADHD, chronic back pain, recurrent UTIs, hysterectomy.    SOCIAL HISTORY:  No tobacco, alcohol or recreational drug use.    ALLERGIES:  CODEINE, ERYTHROMYCIN.    MEDICATIONS:  Augmentin.    PHYSICAL EXAMINATION:  VITAL SIGNS:  BP 127/84, pulse 85, respirations 18, temperature 98.2, pain 8,   O2 saturation 100% on room air.   CONSTITUTIONAL, GENERAL APPEARANCE:  The patient appears well developed, well   nourished.  Seated comfortably on the bed watching television.  HEENT:  Eyes:  Conjunctivae clear, lids normal.  Pupils equal, symmetrical,   and normally reactive.  ENT:  Canals and TMs clear bilaterally.  Mouth/throat:    Oral mucosa is pink and moist.  No lesions.  No trismus or sublingual   elevation.  Floor of mouth is soft.  She easily controlling her secretions.    Mildly poor dentition throughout.  Plaque is present on the majority of her   teeth.  Several molars on the right side are absent.  MUSCULOSKELETAL:  Left upper back 2 molars have been broken and only a small   nubs of the teeth remain.  These teeth also appear to be decaying.  Left lower   third molar is fractured, missing a large portion of the back piece.  This   does not appear to be acute.  Additionally, the tooth looks to be decaying.    Tenderness with palpation to the tooth and adjacent gingiva.  There is no   gingival swelling, erythema or fluctuance.  No facial swelling.  The rest of   teeth and gums are nontender  to palpate.  NECK:  Supple, nontender, no masses or submandibular swelling.  RESPIRATORY:  Lungs clear.  CARDIOVASCULAR:  Regular rate and rhythm.    ASSESSMENT AND MANAGEMENT PLAN:    This is a new problem for this patient.      COURSE IN THE EMERGENCY DEPARTMENT:    The patient returns complaining of continued dental pain.  There is no   abscess.  I have no suspicion for tooth or face infection.  I discussed with   her that she needs to continue antibiotics to help resolve the pain, but   ultimately she is going to need to follow up with a dentist for further   management of the tooth.  Indicated if she is having such severe  pain, I   would recommend trying a dental block to help give her some relief, but she   adamantly declined stating that she got absolutely no relief when this was    attempted yesterday and did not want another dental block.  She states several   times that she wants to be re-medicated with another shot of Toradol and oral   Dilaudid.  This was the only thing that helped with her pain; a 60 mg IM   injection of Toradol was given.  I did discuss with her that for this type of   dental pain, it is not the normal policy of this Emergency Department to treat   this type of issue with narcotic pain medication.  However, after discussion   with Dr. Truddie Crumble, he was agreeable to giving her a single p.o.  Percocet.    However, she was politely informed that she would not receive stronger oral   medication during her visit today and would not receive any IM or IV narcotic   medication and she would not be prescribed any narcotic medication.  She was   given 1 tab Percocet 5/325 mg p.o.  Have provided her with a dental referral   for followup.  She is instructed to call first thing on Monday.  Additionally   recommend that she continue Tylenol and Motrin for pain at home.  Should apply   ice to the area for 10 minutes several times a day to help with pain relief.    May also try warm salt water rinses.  She should continue good dental   hygiene.    As an additional note, the patient was reviewed in the [place] PMP system and   does have an extensive history.  For May alone, she received 3 separate   Percocet prescriptions with the last being prescribed on 05/27.    DIAGNOSIS:  Dentalgia.    DISPOSITION AND PLAN:  The patient discharged home in stable condition with verbal and written   instructions for ongoing care.  Instructed to follow up with dentist as soon   as possible.  She should continue her antibiotics.  She is aware she may   return at any time for new or worsening symptoms such as increased pain,   facial swelling, fever.  The patient was personally evaluated by myself and   Dr. Truddie Crumble,  who agrees with the above assessment and plan.      ___________________   Posey Pronto MD  Dictated By: Verlin Grills, PA    My signature above authenticates this document and my orders, the final   diagnosis (es), discharge prescription (s), and instructions in the Epic   record.  If you have any  questions please contact [phone].    Nursing notes have been reviewed by the physician/ advanced practice   clinician.    LR  D:08/25/2014 11:27:39  T: 08/25/2014 23:00:24  [id number]

## 2014-08-29 NOTE — Progress Notes (Addendum)
[employer] [facility] provided patient with dental as well as medical resources. Life Coach strongly suggested that patient consider the [facility] for current dental issues. [employer] [facility] also provided the patient with a discount rx card, [facility] information and a financial assistance form.  Patient can make contact with [employer] in the event additional assistance if required.

## 2014-09-12 ENCOUNTER — Inpatient Hospital Stay
Admit: 2014-09-12 | Discharge: 2014-09-12 | Disposition: A | Discharge status: Home / Self Care | Payer: Self-pay | Attending: Emergency Medicine

## 2014-09-12 DIAGNOSIS — N390 Urinary tract infection, site not specified: Secondary | ICD-10-CM

## 2014-09-12 LAB — URINE MICROSCOPIC ONLY
RBC: 25 /hpf (ref 0–5)
WBC: 15 /hpf (ref 0–4)

## 2014-09-12 LAB — HEPATIC FUNCTION PANEL
A-G Ratio: 1.1 (ref 0.8–1.7)
ALT (SGPT): 26 U/L (ref 13–56)
AST (SGOT): 17 U/L (ref 15–37)
Albumin: 3.7 g/dL (ref 3.4–5.0)
Alk. phosphatase: 62 U/L (ref 45–117)
Bilirubin, direct: <0.1 MG/DL (ref 0.0–0.2)
Bilirubin, total: 0.3 MG/DL (ref 0.2–1.0)
Globulin: 3.3 g/dL (ref 2.0–4.0)
Protein, total: 7 g/dL (ref 6.4–8.2)

## 2014-09-12 LAB — METABOLIC PANEL, BASIC
Anion gap: 6 mmol/L (ref 3.0–18)
BUN/Creatinine ratio: 16 (ref 12–20)
BUN: 11 MG/DL (ref 7.0–18)
CO2: 26 mmol/L (ref 21–32)
Calcium: 8.9 MG/DL (ref 8.5–10.1)
Chloride: 106 mmol/L (ref 100–108)
Creatinine: 0.68 MG/DL (ref 0.6–1.3)
GFR est AA: >60 mL/min/{1.73_m2} (ref >60)
GFR est non-AA: >60 mL/min/{1.73_m2} (ref >60)
Glucose: 87 mg/dL (ref 74–99)
Potassium: 3.9 mmol/L (ref 3.5–5.5)
Sodium: 138 mmol/L (ref 136–145)

## 2014-09-12 LAB — CBC WITH AUTOMATED DIFF
ABS. BASOPHILS: 0 10*3/uL (ref 0.0–0.06)
ABS. EOSINOPHILS: 0.2 10*3/uL (ref 0.0–0.4)
ABS. LYMPHOCYTES: 2.3 10*3/uL (ref 0.9–3.6)
ABS. MONOCYTES: 0.9 10*3/uL (ref 0.05–1.2)
ABS. NEUTROPHILS: 6.7 10*3/uL (ref 1.8–8.0)
BASOPHILS: 0 % (ref 0–2)
EOSINOPHILS: 2 % (ref 0–5)
HCT: 38.8 % (ref 35.0–45.0)
HGB: 13.1 g/dL (ref 12.0–16.0)
LYMPHOCYTES: 23 % (ref 21–52)
MCH: 29.4 PG (ref 24.0–34.0)
MCHC: 33.8 g/dL (ref 31.0–37.0)
MCV: 87 FL (ref 74.0–97.0)
MONOCYTES: 9 % (ref 3–10)
MPV: 10.7 FL (ref 9.2–11.8)
NEUTROPHILS: 66 % (ref 40–73)
PLATELET: 280 10*3/uL (ref 135–420)
RBC: 4.46 M/uL (ref 4.20–5.30)
RDW: 13.4 % (ref 11.6–14.5)
WBC: 10 10*3/uL (ref 4.6–13.2)

## 2014-09-12 LAB — URINALYSIS W/ RFLX MICROSCOPIC
Bilirubin: NEGATIVE
Glucose: NEGATIVE mg/dL
Ketone: NEGATIVE mg/dL
Nitrites: NEGATIVE
Protein: 300 mg/dL — AB
Specific gravity: 1.022 (ref 1.005–1.030)
Urobilinogen: 0.2 EU/dL (ref 0.2–1.0)
pH (UA): 5.5 (ref 5.0–8.0)

## 2014-09-12 LAB — LIPASE: Lipase: 68 U/L — ABNORMAL LOW (ref 73–393)

## 2014-09-12 LAB — MAGNESIUM: Magnesium: 2.2 mg/dL (ref 1.8–2.4)

## 2014-09-12 MED ORDER — SODIUM CHLORIDE 0.9% BOLUS IV
0.9 % | Freq: Once | INTRAVENOUS | Status: AC
Start: 2014-09-12 — End: 2014-09-12
  Administered 2014-09-12: 21:00:00 via INTRAVENOUS

## 2014-09-12 MED ORDER — PHENAZOPYRIDINE 200 MG TAB
200 mg | ORAL | Status: AC
Start: 2014-09-12 — End: 2014-09-12
  Administered 2014-09-12: 21:00:00 via ORAL

## 2014-09-12 MED ORDER — OXYCODONE-ACETAMINOPHEN 5 MG-325 MG TAB
5-325 mg | ORAL_TABLET | ORAL | Status: DC | PRN
Start: 2014-09-12 — End: 2014-09-18

## 2014-09-12 MED ORDER — NITROFURANTOIN (25% MACROCRYSTAL FORM) 100 MG CAP
100 mg | ORAL_CAPSULE | Freq: Two times a day (BID) | ORAL | Status: DC
Start: 2014-09-12 — End: 2014-09-18

## 2014-09-12 MED ORDER — ONDANSETRON (PF) 4 MG/2 ML INJECTION
4 mg/2 mL | Freq: Once | INTRAMUSCULAR | Status: AC
Start: 2014-09-12 — End: 2014-09-12
  Administered 2014-09-12: 21:00:00 via INTRAVENOUS

## 2014-09-12 MED ORDER — SODIUM CHLORIDE 0.9 % IV PIGGY BACK
1 gram | Freq: Once | INTRAVENOUS | Status: AC
Start: 2014-09-12 — End: 2014-09-12
  Administered 2014-09-12: 21:00:00 via INTRAVENOUS

## 2014-09-12 MED ORDER — KETOROLAC TROMETHAMINE 30 MG/ML INJECTION
30 mg/mL (1 mL) | INTRAMUSCULAR | Status: AC
Start: 2014-09-12 — End: 2014-09-12
  Administered 2014-09-12: 21:00:00 via INTRAVENOUS

## 2014-09-12 MED ORDER — HYDROMORPHONE (PF) 1 MG/ML IJ SOLN
1 mg/mL | INTRAMUSCULAR | Status: AC
Start: 2014-09-12 — End: 2014-09-12
  Administered 2014-09-12: 22:00:00 via INTRAVENOUS

## 2014-09-12 MED ORDER — GENTAMICIN IN SALINE (ISO-OSMOTIC) 80 MG/100 ML IV PIGGY BACK
80 mg/100 mL | INTRAVENOUS | Status: AC
Start: 2014-09-12 — End: 2014-09-12
  Administered 2014-09-12: 22:00:00 via INTRAVENOUS

## 2014-09-12 MED ORDER — ONDANSETRON (PF) 4 MG/2 ML INJECTION
4 mg/2 mL | INTRAMUSCULAR | Status: AC
Start: 2014-09-12 — End: 2014-09-12
  Administered 2014-09-12: 22:00:00 via INTRAVENOUS

## 2014-09-12 MED FILL — KETOROLAC TROMETHAMINE 30 MG/ML INJECTION: 30 mg/mL (1 mL) | INTRAMUSCULAR | Qty: 1

## 2014-09-12 MED FILL — SODIUM CHLORIDE 0.9 % IV: INTRAVENOUS | Qty: 1000

## 2014-09-12 MED FILL — ONDANSETRON (PF) 4 MG/2 ML INJECTION: 4 mg/2 mL | INTRAMUSCULAR | Qty: 2

## 2014-09-12 MED FILL — GENTAMICIN IN SALINE (ISO-OSMOTIC) 80 MG/100 ML IV PIGGY BACK: 80 mg/100 mL | INTRAVENOUS | Qty: 100

## 2014-09-12 MED FILL — PHENAZOPYRIDINE 200 MG TAB: 200 mg | ORAL | Qty: 1

## 2014-09-12 MED FILL — CEFTRIAXONE 1 GRAM SOLUTION FOR INJECTION: 1 gram | INTRAMUSCULAR | Qty: 1

## 2014-09-12 MED FILL — HYDROMORPHONE (PF) 1 MG/ML IJ SOLN: 1 mg/mL | INTRAMUSCULAR | Qty: 1

## 2014-09-12 NOTE — ED Notes (Signed)
Recent treatment for UTI, at [facility]. Had CT with cysts on right ovary. Now having pain in right abd, and pressure. No burning with urination

## 2014-09-12 NOTE — ED Notes (Signed)
I have reviewed discharge instructions with the patient.  The patient verbalized understanding.

## 2014-09-12 NOTE — ED Notes (Signed)
I have briefly evaluated patient and have deemed patient appropriate for tranfer to a fast track ED provider for further evaluation and care. I have initiated some preliminary orders    Visit Vitals   Item Reading   ??? BP 133/85 mmHg   ??? Pulse 87   ??? Temp 98 ??F (36.7 ??C)   ??? Resp 16   ??? Wt 90.719 kg (200 lb)   ??? SpO2 100%         Kathalene Frames, MD

## 2014-09-12 NOTE — ED Provider Notes (Signed)
HPI Comments: [name] is a  44 y.o. Obese white female h/o HTN, Chronic Back Pain, Anxiety, ADHD presents to the ED via POV c/o sharp intermittent suprapubic abdominal pain, burning/urgency/freq of urination x a few days. Pt says she finished Cipro 1 week ago, and before that Keflex. She has scheduled appointment this Monday with [facility]. She has had several CT, as well as U/S which have not aided in the cause. She has had recent Pelvic Exams.     PSX: Hysterectomy, Vaginal Wall Laceration, BTL.     OB/GYN: Dr. Gwinda Passe.                 Patient is a 44 y.o. female presenting with urinary pain.   Urinary Pain   The problem occurs every urination. Associated symptoms include frequency, urgency and abdominal pain. Pertinent negatives include no chills, no nausea, no vomiting, no hematuria, no flank pain and no vaginal discharge.        Past Medical History:   Diagnosis Date   ??? Adult ADHD    ??? Hypertension    ??? Anxiety    ??? Musculoskeletal disorder    ??? Chronic back pain        Past Surgical History:   Procedure Laterality Date   ??? Hx gyn       hysterectomy   ??? Hx orthopaedic     ??? Hx tubal ligation     ??? Hx gyn  11/05/2011     VAGINAL LACERATION REPAIR         Family History:   Problem Relation Age of Onset   ??? Family history unknown: Yes       History     Social History   ??? Marital Status: MARRIED     Spouse Name: N/A   ??? Number of Children: N/A   ??? Years of Education: N/A     Occupational History   ??? Not on file.     Social History Main Topics   ??? Smoking status: Never Smoker    ??? Smokeless tobacco: Never Used   ??? Alcohol Use: Yes   ??? Drug Use: No   ??? Sexual Activity: Not on file     Other Topics Concern   ??? Not on file     Social History Narrative    ** Merged History Encounter **              ALLERGIES: Codeine; Codeine; Erythromycin; and Erythromycin    Review of Systems   Constitutional: Negative for fever and chills.   HENT: Negative for ear pain and sore throat.     Eyes: Negative for pain and visual disturbance.   Respiratory: Negative for cough and shortness of breath.    Cardiovascular: Negative for chest pain and palpitations.   Gastrointestinal: Positive for abdominal pain. Negative for nausea, vomiting and diarrhea.   Genitourinary: Positive for dysuria, urgency, frequency and pelvic pain. Negative for hematuria, flank pain, vaginal bleeding, vaginal discharge and difficulty urinating.   Musculoskeletal: Negative for joint swelling and arthralgias.   Skin: Negative for color change, pallor, rash and wound.   Neurological: Negative for dizziness and headaches.   Psychiatric/Behavioral: Negative for behavioral problems. The patient is not nervous/anxious.        Filed Vitals:    09/12/14 1257   BP: 133/85   Pulse: 87   Temp: 98 ??F (36.7 ??C)   Resp: 16   Weight: 90.719 kg (200 lb)   SpO2:  100%            Physical Exam   Constitutional: She is oriented to person, place, and time. She appears well-developed and well-nourished. No distress.   Cardiovascular: Normal rate, regular rhythm and normal heart sounds.  Exam reveals no gallop and no friction rub.    No murmur heard.  Pulmonary/Chest: Effort normal and breath sounds normal. No respiratory distress. She has no wheezes. She has no rales.   Abdominal: Soft. Bowel sounds are normal. There is tenderness in the suprapubic area. There is no rigidity, no rebound, no guarding and no CVA tenderness.   Abdomen is soft. Normoactive BS. + Suprapubic TTP. No rebound or guarding.        Neurological: She is alert and oriented to person, place, and time. She has normal strength. She is not disoriented. No sensory deficit.   Skin: Skin is warm and dry. No rash noted. She is not diaphoretic.   Nursing note and vitals reviewed.       MDM  Number of Diagnoses or Management Options  Recurrent UTI (urinary tract infection): new and requires workup  Suprapubic abdominal pain: new and requires workup   Diagnosis management comments: DDX: Abdominal pain, Pregnancy, Pancreatitis, Ectopic Pregnancy, Gastritis, AMI, Gastroenteritis, Colitis, Diverticulitis, Ovarian Cyst, Ovarian Torsion, Tubo-Ovarian Abscess, PID, PUD, Cholecystitis, Choledocholithiasis, Mesenteric Ischemia, Ectopic Pregnancy Rupture, Pelvic Pain Syndrome, Acute Cystitis, Pyelonephritis, Renal Colic, Biliary Colic, Perforation, Splenic Flexure Syndrome, Abdominal Aortic Aneurysm, Gastrointestinal Bleed.     Lengthy review of CT abd/pelvis, U/S, as well as Urine C&S.     Consulted with Dr. Alison Stalling concerning patient Imojean Yoshino, standard discussion of reason for visit, HPI, ROS, PE, and current results available.  Recommendation for IV abx which he has ordered. No additional advanced imaging is necessary.    Tasia Catchings, PA  September 12, 2014  5:00 PM     6:08 PM: Pt feeling improved, labs reassuring. Will d/c on Macrobid. Pt has scheduled already with [facility].     Reviewed workup results, any meds, and discharge instructions OR admission plan with patient and any family present.  Answered all questions. Tasia Catchings, PA  6:09 PM    PLEASE FOLLOW-UP AS DIRECTED WITHOUT FAIL WITHIN THE TIME FRAME RECOMMENDED AS FAILURE TO DO SO COULD RESULT IN WORSENING OF YOUR PHYSICAL CONDITION, DEATH, AND OR PERMANENT DISABILITY.     RETURN TO THE EMERGENCY DEPARTMENT AT IF YOU ARE UNABLE TO FOLLOW-UP AS DIRECTED.     RETURN TO THE EMERGENCY DEPARTMENT AT ONCE IF YOU HAVE SYMPTOMS THAT DO NOT IMPROVE WITH TREATMENT, NEW SYMPTOMS, WORSENING SYMPTOMS, OR ANY OTHER CONCERNS.     THE PATIENT AGREES WITH THE DISCHARGE PLAN AND FOLLOW-UP INSTRUCTIONS. THE PATIENT AGREES TO REVIEW ALL HANDOUTS.          Amount and/or Complexity of Data Reviewed  Clinical lab tests: reviewed and ordered  Tests in the medicine section of CPT??: reviewed and ordered  Decide to obtain previous medical records or to obtain history from someone other than the patient: yes   Review and summarize past medical records: yes  Discuss the patient with other providers: yes  Independent visualization of images, tracings, or specimens: yes    Risk of Complications, Morbidity, and/or Mortality  Presenting problems: moderate  Diagnostic procedures: moderate  Management options: moderate    Patient Progress  Patient progress: stable      Procedures   [facility] PELVIC U/S 08/22/14  Impression:  1. No  acute findings on pelvic ultrasound.     Thank you for this referral.     Result Narrative   Procedure: Ultrasound pelvis non-OB    Indication: Persistent right lower quadrant pain. Previous hysterectomy.    Technique: Static two-dimensional gray scale,  color Doppler,  assessment of the pelvis. Acquired transabdominally.    Comparison: Recent CT and ultrasound.    Findings:  Hysterectomy. Vaginal cuff unremarkable. Moderately well-distended urinary bladder, no focal finding. There is no ascites.    Right ovary measures 3 x 1.9 x 2.8 cm, macroscopically normal. Intact vascularity. The left ovary could not be identified.     [facility] SECOUR CT ABD/PELVIS W/ IV ONLY CONTRAST 08/16/14    Final result (08/16/2014) Open    ??   Result Impression    ?? IMPRESSION:  ??  1. No acute findings to explain right lower quadrant pain.  ??Appendix and terminal ileum are normal.  2. ??Severe degenerative disc disease L5-S1.  ??  Narrative  CT abdomen and pelvis with enhancement  ??  INDICATION: Right lower quadrant pain.  ??  TECHNIQUE: Axial images obtained of the abdomen and pelvis  following the uneventful administration of oral and 100cc's  Omnipaque 350 nonionic intravenous contrast. ??Coronal and  sagittal reconstructions provided.  ??  COMPARISON: Pelvic ultrasound 08/16/2014; CT abdomen and pelvis  08/12/2014  ??  ABDOMEN FINDINGS:   ??  Liver: Small geographic region of focal fat adjacent to the  falciform ligament.   Spleen: No splenomegaly.   Biliary: Gallbladder unremarkable. No intra or extrahepatic  biliary dilation.   Pancreas: No focal mass. ??No significant ductal dilatation.   Bowel: Normal in caliber and without wall thickening or  pericolonic stranding. ??Appendix is normal in caliber, without  inflammatory changes and contains air. ??Terminal ileum is  unremarkable. ??There is no pneumatosis. ??  Peritoneum/ Retroperitoneum: No free fluid or free air.  Lymph nodes: ??No lymphadenopathy.  ??  Adrenal glands: No focal nodule.   Kidneys: There are a few tiny renal hypodensities which are too  small to characterize but are not significantly changed. ??No  renal or ureteral calculi. ??No hydronephrosis. ??  ??  Vessels: ??Unremarkable. ??  ??  PELVIS FINDINGS:  ??  Bladder/ Pelvic organs: ??Bladder is unremarkable. ??Status post  hysterectomy. ??No suspicious adnexal lesion..   ??  Lung bases:Minimal subsegmental atelectasis at the lung bases.  ??  Bones: No suspicious lytic or blastic bony lesions. ??Severe  degenerative disc disease L5-S1 with bone on bone contact and  vacuum disc phenomenon.  ??  ?? Dictated by: Peterson Lombard on Fri Aug 16, 2014 11:01:58 AM  EDT    ~~THIS REPORT WAS COPIED AND PASTED FROM THE SERVICING EHR  SYSTEM.~~     [facility] TRANSVAG[facility] U/S RESULT 08/16/14     Impression:    1. Normal ovaries.    2. Absent uterus. No pelvic mass or ascites to explain pain.     Result Narrative   Pelvic and Transvaginal Ultrasound    History: Right lower quadrant pain, hysterectomy    Comparison: Pelvic ultrasound 10/15/11, CT abdomen 08/12/14    Technique: Real time ultrasound of the pelvis performed using gray scale imaging and color and spectral Doppler analysis of the ovaries.  Selected images provided for interpretation.      Findings:   Transabdominal: The uterus is absent. The bladder is normal.    Transvaginal:  No evidence of free fluid. Vaginal cuff is normal.    Both ovaries  are visualized.  The right ovary measures 1.8 x 2.1 x 2.4 cm.  The left ovary measures 1.6 x 1.5 x 2.7 cm.     A dominant follicle in the right ovary measures 6 x 13 x 10 mm..    Blood Flow:  There is normal blood flow and waveform on color and spectral Doppler analysis in both ovaries.     [facility] 08/12/14 CT ABD/PELVIS RESULT    Impression:    1. No bowel obstruction or inflammation. Normal appendix.  2. Hysterectomy. Small amount of fluid/peritoneal thickening. This may be from ovulation. No adnexal mass.  3. Tiny right renal cyst and fat containing umbilical hernia.     Result Narrative   CT abdomen and pelvis with enhancement    CPT: 03500    INDICATION: Right lower quadrant pain and a history of ovarian cysts and colitis    TECHNIQUE: 5 mm collimation axial images obtained from the diaphragm to the level of the pubic symphysis following the uneventful administration of  100 cc of nonionic intravenous contrast.    COMPARISON: None    ABDOMEN FINDINGS:   Lung Bases: There is subsegmental atelectasis in the middle lobe and lingula. Mediastinum and esophagus are normal.    Liver:Normal in attenuation. No evidence of mass.    Gallbladder:Present and appears normal.  No biliary ductal dilatation.    Spleen:Normal in size and attenuation without mass.    Pancreas:Normal in attenuation without mass or ductal dilatation.    Adrenal Glands:Normal.    Kidneys:Symmetric enhancement with a tiny low attenuating lesion in the right kidney suggestive of a cyst.  No hydronephrosis.    Lymph Nodes:No lymphadenopathy.    Aorta is normal in diameter.      No free fluid or fluid collections.    PELVIS FINDINGS:   Bowel:Small bowel: Normal in caliber with normal wall thickness.  Large bowel: Normal in caliber with normal wall thickness.  Appendix: Normal.    Bladder:Under distended but otherwise normal.    The uterus is absent. The ovaries are visible and are normal in morphology. There are no adnexal masses.     There is thickening of the peritoneal reflections in the pelvis. This may or may not reflect fluid.     There are no loculated fluid collections.    There is a tiny fat containing umbilical hernia.    Bones: There degenerative changes of L5-S1 with grade one retrolisthesis of L5 on S1. There are no destructive lesions.         Diagnosis:   1. Suprapubic abdominal pain    2. Recurrent UTI (urinary tract infection)          Disposition: HOME    Follow-up Information     Follow up With Details Comments Contact Info    [facility] EMERGENCY DEPT  As needed, If symptoms worsen [place] [address]  [phone]    Leroy Libman, MD On 09/18/2014 As scheduled [address]  [place] [address]  [phone]      [facility] On 09/16/2014 As Scheduled.  [place]    Johnnette Litter, MD In 1 day Re-evaluation without fail.  [place] [id number]  [phone]            Current Discharge Medication List      START taking  these medications    Details   nitrofurantoin, macrocrystal-monohydrate, (MACROBID) 100 mg capsule Take 1 Cap by mouth two (2) times a day for 7 days.  Qty: 14 Cap, Refills: 0      oxyCODONE-acetaminophen (PERCOCET) 5-325 mg per tablet Take 1 Tab by mouth every four (4) hours as needed (BREAKTHROUGH PAIN ONLY, DO NOT FILL UNLESS MACROBID IS FILLED.). Max Daily Amount: 6 Tabs.  Qty: 20 Tab, Refills: 0             Recent Results (from the past 24 hour(s))   URINALYSIS W/ RFLX MICROSCOPIC    Collection Time: 09/12/14 12:59 PM   Result Value Ref Range    Color YELLOW      Appearance CLOUDY      Specific gravity 1.022 1.005 - 1.030      pH (UA) 5.5 5.0 - 8.0      Protein 300 (A) NEG mg/dL    Glucose NEGATIVE  NEG mg/dL    Ketone NEGATIVE  NEG mg/dL    Bilirubin NEGATIVE  NEG      Blood LARGE (A) NEG      Urobilinogen 0.2 0.2 - 1.0 EU/dL    Nitrites NEGATIVE  NEG      Leukocyte Esterase MODERATE (A) NEG     URINE MICROSCOPIC ONLY     Collection Time: 09/12/14 12:59 PM   Result Value Ref Range    WBC 15 to 25 0 - 4 /hpf    RBC 25 to 35 0 - 5 /hpf    Epithelial cells FEW 0 - 5 /lpf    Bacteria 3+ (A) NEG /hpf   CBC WITH AUTOMATED DIFF    Collection Time: 09/12/14  4:10 PM   Result Value Ref Range    WBC 10.0 4.6 - 13.2 K/uL    RBC 4.46 4.20 - 5.30 M/uL    HGB 13.1 12.0 - 16.0 g/dL    HCT 38.8 35.0 - 45.0 %    MCV 87.0 74.0 - 97.0 FL    MCH 29.4 24.0 - 34.0 PG    MCHC 33.8 31.0 - 37.0 g/dL    RDW 13.4 11.6 - 14.5 %    PLATELET 280 135 - 420 K/uL    MPV 10.7 9.2 - 11.8 FL    NEUTROPHILS 66 40 - 73 %    LYMPHOCYTES 23 21 - 52 %    MONOCYTES 9 3 - 10 %    EOSINOPHILS 2 0 - 5 %    BASOPHILS 0 0 - 2 %    ABS. NEUTROPHILS 6.7 1.8 - 8.0 K/UL    ABS. LYMPHOCYTES 2.3 0.9 - 3.6 K/UL    ABS. MONOCYTES 0.9 0.05 - 1.2 K/UL    ABS. EOSINOPHILS 0.2 0.0 - 0.4 K/UL    ABS. BASOPHILS 0.0 0.0 - 0.06 K/UL    DF AUTOMATED     HEPATIC FUNCTION PANEL    Collection Time: 09/12/14  4:10 PM   Result Value Ref Range    Protein, total 7.0 6.4 - 8.2 g/dL    Albumin 3.7 3.4 - 5.0 g/dL    Globulin 3.3 2.0 - 4.0 g/dL    A-G Ratio 1.1 0.8 - 1.7      Bilirubin, total 0.3 0.2 - 1.0 MG/DL    Bilirubin, direct <0.1 0.0 - 0.2 MG/DL    Alk. phosphatase 62 45 - 117 U/L    AST 17 15 - 37 U/L    ALT 26 13 - 56  U/L   LIPASE    Collection Time: 09/12/14  4:10 PM   Result Value Ref Range    Lipase 68 (L) 73 - 393 U/L   MAGNESIUM    Collection Time: 09/12/14  4:10 PM   Result Value Ref Range    Magnesium 2.2 1.8 - 2.4 mg/dL   METABOLIC PANEL, BASIC    Collection Time: 09/12/14  4:10 PM   Result Value Ref Range    Sodium 138 136 - 145 mmol/L    Potassium 3.9 3.5 - 5.5 mmol/L    Chloride 106 100 - 108 mmol/L    CO2 26 21 - 32 mmol/L    Anion gap 6 3.0 - 18 mmol/L    Glucose 87 74 - 99 mg/dL    BUN 11 7.0 - 18 MG/DL    Creatinine 0.68 0.6 - 1.3 MG/DL    BUN/Creatinine ratio 16 12 - 20      GFR est AA >60 >60 ml/min/1.52m    GFR est non-AA >60 >60 ml/min/1.720m   Calcium 8.9 8.5 - 10.1 MG/DL

## 2014-09-15 LAB — CULTURE, URINE
Culture result:: 100000
Culture result:: >100000
Culture result:: >100000
Culture result:: >100000

## 2014-09-16 ENCOUNTER — Encounter: Attending: Urology | Primary: Nurse Practitioner

## 2014-09-16 ENCOUNTER — Inpatient Hospital Stay
Admit: 2014-09-16 | Discharge: 2014-09-16 | Disposition: A | Discharge status: Home / Self Care | Payer: Self-pay | Attending: Emergency Medicine

## 2014-09-16 DIAGNOSIS — F411 Generalized anxiety disorder: Secondary | ICD-10-CM

## 2014-09-16 MED ORDER — CLONAZEPAM 0.5 MG TAB
0.5 mg | ORAL | Status: AC
Start: 2014-09-16 — End: 2014-09-16
  Administered 2014-09-16: 17:00:00 via ORAL

## 2014-09-16 MED ORDER — CLONAZEPAM 1 MG TAB
1 mg | ORAL_TABLET | Freq: Two times a day (BID) | ORAL | Status: DC
Start: 2014-09-16 — End: 2014-12-06

## 2014-09-16 MED FILL — CLONAZEPAM 0.5 MG TAB: 0.5 mg | ORAL | Qty: 2

## 2014-09-16 NOTE — ED Notes (Signed)
"  i'm out of my anxiety medication and have been out for 2 days, i have an appointment on July 5th, i can't see anyone else because i don't have insurance"

## 2014-09-16 NOTE — ED Provider Notes (Signed)
HPI Comments: 12:00 PM [name] is a 44 y.o. female with h/o anxiety, HTN and chronic back pain who presents to the Emergency Department for medication refill.  Pt states she has been out of her anxiety medication for the past 2 days.  She explains she has an appointment on 7/5 but cannot be seen by anyone else due to lack of insurance. No other complaints at this time.                 Patient is a 44 y.o. female presenting with medication refill. The history is provided by the patient.   Medication Refill  Pertinent negatives include no chest pain, no abdominal pain and no shortness of breath.        Past Medical History:   Diagnosis Date   ??? Adult ADHD    ??? Hypertension    ??? Anxiety    ??? Musculoskeletal disorder    ??? Chronic back pain        Past Surgical History:   Procedure Laterality Date   ??? Hx gyn       hysterectomy   ??? Hx orthopaedic     ??? Hx tubal ligation     ??? Hx gyn  11/05/2011     VAGINAL LACERATION REPAIR         Family History:   Problem Relation Age of Onset   ??? Family history unknown: Yes       History     Social History   ??? Marital Status: MARRIED     Spouse Name: N/A   ??? Number of Children: N/A   ??? Years of Education: N/A     Occupational History   ??? Not on file.     Social History Main Topics   ??? Smoking status: Never Smoker    ??? Smokeless tobacco: Never Used   ??? Alcohol Use: Yes   ??? Drug Use: No   ??? Sexual Activity: Not on file     Other Topics Concern   ??? Not on file     Social History Narrative    ** Merged History Encounter **              ALLERGIES: Azithromycin; Codeine; Codeine; Erythromycin; and Erythromycin    Review of Systems   Constitutional: Negative for fever, chills, fatigue and unexpected weight change.   HENT: Negative for congestion and rhinorrhea.    Respiratory: Negative for chest tightness and shortness of breath.    Cardiovascular: Negative for chest pain, palpitations and leg swelling.   Gastrointestinal: Negative for nausea, vomiting and abdominal pain.    Genitourinary: Negative for dysuria.   Musculoskeletal: Negative for back pain.   Skin: Negative for rash.   Neurological: Negative for dizziness and weakness.   Psychiatric/Behavioral: The patient is not nervous/anxious.        Filed Vitals:    09/16/14 1157   BP: 148/92   Pulse: 86   Temp: 98.5 ??F (36.9 ??C)   Resp: 16   Height:  (1.702 m)   SpO2: 100%            Physical Exam   Constitutional: She is oriented to person, place, and time. She appears well-developed and well-nourished. No distress.   HENT:   Head: Normocephalic and atraumatic.   Right Ear: External ear normal.   Left Ear: External ear normal.   Nose: Nose normal.   Mouth/Throat: Oropharynx is clear and moist.   Eyes: Conjunctivae and  EOM are normal. Pupils are equal, round, and reactive to light. No scleral icterus.   Neck: Normal range of motion. Neck supple. No JVD present. No tracheal deviation present. No thyromegaly present.   Cardiovascular: Normal rate, regular rhythm, normal heart sounds and intact distal pulses.  Exam reveals no gallop and no friction rub.    No murmur heard.  Pulmonary/Chest: Effort normal and breath sounds normal. She exhibits no tenderness.   Abdominal: Soft. Bowel sounds are normal. She exhibits no distension. There is no tenderness. There is no rebound and no guarding.   Musculoskeletal: Normal range of motion. She exhibits no edema or tenderness.   Lymphadenopathy:     She has no cervical adenopathy.   Neurological: She is alert and oriented to person, place, and time. She has normal reflexes. No cranial nerve deficit. Coordination normal.   No sensory loss, Gait normal, Motor 5/5   Skin: Skin is warm and dry.   Psychiatric: She has a normal mood and affect. Her behavior is normal. Judgment and thought content normal.   Nursing note and vitals reviewed.       MDM  Number of Diagnoses or Management Options  Diagnosis management comments: Pt is a 44yo female with a hx of anxiety  that presents to the ED for a limited refill of her Klonipin. Pt notes that she cannot afford her psychiatrist.  She has an appointment with the [facility] on July 7th and needs her medications.  Pt does not appear to be withdrawing and it appears she has been treated for ENT infection, cystitis in the recent past.  I will give a limited number of klonipin if she has set up the [facility] appointment and will ask case management to assist.  Myriam Jacobson, DO 12:08 PM        Procedures    Vitals:  Patient Vitals for the past 12 hrs:   Temp Pulse Resp BP SpO2   09/16/14 1157 98.5 ??F (36.9 ??C) 86 16 (!) 148/92 mmHg 100 %     100% on RA, indicating adequate oxygenation.    Medications ordered:   Medications   clonazePAM (KlonoPIN) tablet 1 mg (1 mg Oral Given 09/16/14 1249)         Lab findings:  No results found for this or any previous visit (from the past 12 hour(s)).          X-Ray, CT or other radiology findings or impressions:  No orders to display       Progress notes, Consult notes or additional Procedure notes:   1:00 PM Pt seen by Danne Harbor, case Production designer, theatre/television/film.  Confirmed pt has appointment next week at [facility].  Will give 15 tablets of Klonopin so she can make it to her appointment.       Disposition:  Diagnosis:   1. Anxiety state    2. Medication refill        Disposition: Discharged    Follow-up Information     Follow up With Details Comments Contact Info    [facility] [place] Go to For scheduled appointment [address] [address]  [phone]    [facility] EMERGENCY DEPT  As needed [address]  [place] [address]  [phone]           Current Discharge Medication List      CONTINUE these medications which have CHANGED    Details   clonazePAM (KLONOPIN) 1 mg tablet Take 1 Tab by mouth two (  2) times a day. Max Daily Amount: 2 mg.  Qty: 15 Tab, Refills: 0         CONTINUE these medications which have NOT CHANGED    Details   cyclobenzaprine (FLEXERIL) 5 mg tablet Take by mouth nightly.       etodolac (LODINE) 300 mg capsule Take 300 mg by mouth 2 times daily.      traMADol (ULTRAM) 50 mg tablet Take 50 mg by mouth every 6 hours as needed for pain.      HYDROcodone-acetaminophen (NORCO) 5-325 mg per tablet Take 1 Tab by Mouth Every 4 Hours As Needed for Pain.      levofloxacin (LEVAQUIN) 500 mg tablet 500 mg.      naproxen (NAPROSYN) 500 mg tablet 500 mg.      ondansetron (ZOFRAN ODT) 4 mg disintegrating tablet 4 mg.      senna (SENOKOT) 8.6 mg tablet 8.6 mg.      acetaminophen (TYLENOL) 325 mg tablet Take 975 mg by mouth.      ibuprofen (MOTRIN) 200 mg tablet Take 600 mg by mouth.      ibuprofen-famotidine 800-26.6 mg tab Take 800 mg by mouth.      metoclopramide HCl (REGLAN) 10 mg tablet Take 10 mg by mouth.      promethazine (PHENERGAN) 25 mg tablet Take 25 mg by mouth.      nitrofurantoin, macrocrystal-monohydrate, (MACROBID) 100 mg capsule Take 1 Cap by mouth two (2) times a day for 7 days.  Qty: 14 Cap, Refills: 0      oxyCODONE-acetaminophen (PERCOCET) 5-325 mg per tablet Take 1 Tab by mouth every four (4) hours as needed (BREAKTHROUGH PAIN ONLY, DO NOT FILL UNLESS MACROBID IS FILLED.). Max Daily Amount: 6 Tabs.  Qty: 20 Tab, Refills: 0             Scribe Attestation  I, Web designerMarisa Mosier, am scribing for, and in the presence of Alver Sorrowaniel I Tryphena Perkovich, MD    12:02 PM, 09/16/2014    Physician Attestation  I personally performed the services described in the documentation, reviewed the documentation, as recorded by the scribe in my presence, and it accurately and completely records my words and actions.    Alver Sorrowaniel I Michelle Vanhise, MD

## 2014-09-16 NOTE — Progress Notes (Addendum)
[facility].  Patient will be able to attend the walk-in clinic the following day at either 9:30am (arrive around 8-8:15am) or 10:45am (arrive at 9:45am).    Patient requesting indigent medication, however SW unable to fill as it a benzodiazapine/semi-controlled substance.  Patient provided with a prescription discount card and stated she can afford the medication.

## 2014-09-16 NOTE — ED Notes (Signed)
I have reviewed discharge instructions with the patient.  The patient verbalized understanding.all discharge paperwork and education given.

## 2014-09-16 NOTE — ED Notes (Signed)
I have reviewed discharge instructions with the patient.  The patient verbalized understanding. All discharge paperwork and education given. No questions at this time.

## 2014-09-18 ENCOUNTER — Inpatient Hospital Stay
Admit: 2014-09-18 | Discharge: 2014-09-18 | Disposition: A | Discharge status: Home / Self Care | Payer: Self-pay | Attending: Emergency Medicine

## 2014-09-18 DIAGNOSIS — N3001 Acute cystitis with hematuria: Secondary | ICD-10-CM

## 2014-09-18 LAB — URINE MICROSCOPIC ONLY: WBC: 36 /hpf (ref 0–4)

## 2014-09-18 LAB — URINALYSIS W/ RFLX MICROSCOPIC
Bilirubin: NEGATIVE
Glucose: NEGATIVE mg/dL
Ketone: NEGATIVE mg/dL
Nitrites: POSITIVE — AB
Protein: 300 mg/dL — AB
Specific gravity: 1.024 (ref 1.005–1.030)
Urobilinogen: 1 EU/dL (ref 0.2–1.0)
pH (UA): 5.5 (ref 5.0–8.0)

## 2014-09-18 MED ORDER — OXYCODONE-ACETAMINOPHEN 5 MG-325 MG TAB
5-325 mg | ORAL | Status: AC
Start: 2014-09-18 — End: 2014-09-18
  Administered 2014-09-18: 19:00:00 via ORAL

## 2014-09-18 MED ORDER — LEVOFLOXACIN 500 MG TAB
500 mg | ORAL_TABLET | Freq: Every day | ORAL | Status: DC
Start: 2014-09-18 — End: 2014-12-06

## 2014-09-18 MED ORDER — LEVOFLOXACIN 500 MG TAB
500 mg | ORAL | Status: AC
Start: 2014-09-18 — End: 2014-09-18
  Administered 2014-09-18: 19:00:00 via ORAL

## 2014-09-18 MED FILL — OXYCODONE-ACETAMINOPHEN 5 MG-325 MG TAB: 5-325 mg | ORAL | Qty: 1

## 2014-09-18 MED FILL — LEVOFLOXACIN 500 MG TAB: 500 mg | ORAL | Qty: 1

## 2014-09-18 NOTE — ED Notes (Signed)
I have reviewed discharge instructions with the patient.  The patient verbalized understanding.

## 2014-09-18 NOTE — ED Provider Notes (Signed)
HPI Comments: 44yo female presents to ER complaining of lower abdominal discomfort and urinary discomfort.  Symptoms started over week ago.  Nausea, no vomiting.   Patient seen here on 6/23.  Currently taking Macrobid.  According to urine culture she has 4 organisms, 2 susceptible to Macrobid, other 2 organism with Macrobid sensitivity testing.  All organisms susceptible to Levofloxacin.  Patient is requesting referrals to urology and gyn.  She is requesting pain medication at this time.     Patient is a 44 y.o. female presenting with urinary pain.   Urinary Pain   Associated symptoms include nausea and abdominal pain. Pertinent negatives include no chills, no vomiting, no flank pain and no back pain.        Past Medical History:   Diagnosis Date   ??? Adult ADHD    ??? Hypertension    ??? Anxiety    ??? Musculoskeletal disorder    ??? Chronic back pain        Past Surgical History:   Procedure Laterality Date   ??? Hx gyn       hysterectomy   ??? Hx orthopaedic     ??? Hx tubal ligation     ??? Hx gyn  11/05/2011     VAGINAL LACERATION REPAIR         Family History:   Problem Relation Age of Onset   ??? Family history unknown: Yes       History     Social History   ??? Marital Status: MARRIED     Spouse Name: N/A   ??? Number of Children: N/A   ??? Years of Education: N/A     Occupational History   ??? Not on file.     Social History Main Topics   ??? Smoking status: Never Smoker    ??? Smokeless tobacco: Never Used   ??? Alcohol Use: Yes   ??? Drug Use: No   ??? Sexual Activity: Not on file     Other Topics Concern   ??? Not on file     Social History Narrative    ** Merged History Encounter **              ALLERGIES: Azithromycin; Codeine; Codeine; Erythromycin; and Erythromycin    Review of Systems   Constitutional: Negative for fever, chills, activity change and appetite change.   HENT: Negative.    Cardiovascular: Negative.    Gastrointestinal: Positive for nausea and abdominal pain. Negative for vomiting, diarrhea and constipation.    Genitourinary: Positive for dysuria and pelvic pain. Negative for flank pain.   Musculoskeletal: Negative for back pain.   Skin: Negative.    Neurological: Negative.    All other systems reviewed and are negative.      Filed Vitals:    09/18/14 1302   BP: 160/101   Pulse: 111   Temp: 98.7 ??F (37.1 ??C)   Resp: 18   Height: 5\' 6"  (1.676 m)   Weight: 90.719 kg (200 lb)   SpO2: 99%            Physical Exam   Constitutional: She appears well-developed and well-nourished. No distress.   Neck: Normal range of motion. Neck supple.   Cardiovascular: Regular rhythm and normal heart sounds.  Tachycardia present.    No murmur heard.  Abdominal: Soft. Bowel sounds are normal. She exhibits no distension. There is no tenderness. There is no rebound and no guarding.   Musculoskeletal: Normal range of motion.   Skin:  Skin is warm. She is not diaphoretic.   Psychiatric: She has a normal mood and affect.   Nursing note and vitals reviewed.       MDM  Number of Diagnoses or Management Options  Diagnosis management comments: 44yo female with NUMEROUS ER visits to multiple facilities by multiple providers over the last 2-3 years presenting to ER complaining of abdominal discomfort and painful urination.  Requesting percocet for discomfort.  Will give one percocet here and dose of levaquin.  Will Rx for levaquin given results of 6/23 urine culture.  Do not feel comfortable writing for narcotics.  Patient asked to see another provider.  Dr. Randa Evens pulled patient's PMP (numerous narcotic Rx's since July last year reported) and spoke with patient.  Agrees the patient should not have Rx for narcotics.  GYN and Urology referral.         Procedures

## 2014-09-18 NOTE — ED Notes (Signed)
"  i was seen last week with a uti real bad and i've got pain on my right hand side, i was supposed to see urology on Monday but they needed $150 payment which i dont have, my obgyn needed $140 payment to see them, i still have all my symptoms and i only have one day left of the macrobid"

## 2014-09-25 ENCOUNTER — Inpatient Hospital Stay
Admit: 2014-09-25 | Discharge: 2014-09-25 | Disposition: A | Discharge status: Home / Self Care | Payer: Self-pay | Attending: Emergency Medicine

## 2014-09-25 DIAGNOSIS — F419 Anxiety disorder, unspecified: Secondary | ICD-10-CM

## 2014-09-25 MED ORDER — BUTALBITAL-ACETAMINOPHEN-CAFFEINE 50 MG-325 MG-40 MG TAB
50-325-40 mg | ORAL | Status: AC
Start: 2014-09-25 — End: 2014-09-25
  Administered 2014-09-25: 18:00:00 via ORAL

## 2014-09-25 MED ORDER — LORAZEPAM 1 MG TAB
1 mg | Freq: Once | ORAL | Status: AC
Start: 2014-09-25 — End: 2014-09-25
  Administered 2014-09-25: 16:00:00 via ORAL

## 2014-09-25 MED ORDER — PROPRANOLOL 80 MG 24 HR SUSTAINED ACTION CAP
80 mg | Freq: Every day | ORAL | Status: DC
Start: 2014-09-25 — End: 2014-09-25
  Administered 2014-09-25: 16:00:00 via ORAL

## 2014-09-25 MED ORDER — NAPROXEN 250 MG TAB
250 mg | ORAL | Status: AC
Start: 2014-09-25 — End: 2014-09-25
  Administered 2014-09-25: 16:00:00 via ORAL

## 2014-09-25 MED ORDER — PROPRANOLOL 80 MG 24 HR SUSTAINED ACTION CAP
80 mg | ORAL_CAPSULE | Freq: Every day | ORAL | Status: DC | PRN
Start: 2014-09-25 — End: 2014-12-06

## 2014-09-25 MED ORDER — CLONAZEPAM 1 MG TAB
1 mg | ORAL | Status: AC
Start: 2014-09-25 — End: 2014-09-25
  Administered 2014-09-25: 18:00:00 via ORAL

## 2014-09-25 MED ORDER — ZOLPIDEM 5 MG TAB
5 mg | ORAL_TABLET | Freq: Every evening | ORAL | Status: DC | PRN
Start: 2014-09-25 — End: 2014-12-06

## 2014-09-25 MED FILL — BUTALBITAL-ACETAMINOPHEN-CAFFEINE 50 MG-325 MG-40 MG TAB: 50-325-40 mg | ORAL | Qty: 1

## 2014-09-25 MED FILL — LORAZEPAM 1 MG TAB: 1 mg | ORAL | Qty: 2

## 2014-09-25 MED FILL — CLONAZEPAM 1 MG TAB: 1 mg | ORAL | Qty: 1

## 2014-09-25 MED FILL — NAPROXEN 250 MG TAB: 250 mg | ORAL | Qty: 2

## 2014-09-25 MED FILL — PROPRANOLOL 80 MG 24 HR SUSTAINED ACTION CAP: 80 mg | ORAL | Qty: 1

## 2014-09-25 NOTE — ED Provider Notes (Signed)
[facility]  EMERGENCY [facility] TREATMENT REPORT  NAME:  [name], [name]  SEX:   F  ADMIT: 09/25/2014  DOB:   December 23, 1970  MR#    [id number]  ROOM:  [id number]  TIME DICTATED: 12 23 PM  ACCT#  [id number]        CHIEF COMPLAINT:  Anxiety and headache.    HISTORY OF PRESENT ILLNESS:  A 44 year old female who states that for the last 2 days she has had a   headache.  She has also had panic attacks.  States she ran out of her anxiety   medication.  She was supposed to have a [facility] appointment yesterday.  She   states, however, she missed her appointment because she was having panic   attacks and could not leave the house.  I asked her about her visit to [facility]   yesterday and she said yes, she did leave the house yesterday to go the   hospital, but was unable to go to her [facility] appointment.  She has not taken   anything for her headache.  She states she used to be on Inderal and that was   helpful.  She states she is supposed to be on anxiety medications.  She denies   suicidal thoughts, homicidal thoughts.  She never had seizures when off of   anxiety medicines.    REVIEW OF SYSTEMS:  PULMONARY:  No shortness of breath.  CARDIOVASCULAR:  No chest pain.  GASTROINTESTINAL:  No abdominal pain, vomiting or diarrhea.  GENITOURINARY:  No dysuria.  SKIN:  No rashes.  NEUROLOGIC:  No tingling, numbness or weakness throughout.    PAST MEDICAL HISTORY:  ADHD, hypertension, anxiety, musculoskeletal disorder, chronic back pain,   narcotic seeking behavior.    SOCIAL HISTORY:  She was never a smoker.  Occasionally uses alcohol.    MEDICATIONS:  Reviewed.    ALLERGIES:  REVIEWED.    PHYSICAL EXAMINATION:  VITAL SIGNS:  Temperature 98.6, pulse 101, respiratory rate 18, O2 sat 96% on   room air.  GENERAL APPEARANCE:  Patient appears well developed and well nourished.    Appearance and behavior are age and situation appropriate.  HEENT:  Eyes:  Conjunctivae clear, lids normal.  Pupils equal, symmetrical,   and normally reactive.   Mouth/Throat:  Surfaces of the pharynx, palate, and tongue are pink, moist,   and without lesions.  RESPIRATORY:  Clear and equal breath sounds.  No respiratory distress,   tachypnea, or accessory muscle use.    CARDIOVASCULAR:  Heart regular, without murmurs, gallops, rubs or thrills.  GASTROINTESTINAL:  Abdomen soft, completely nontender.  SKIN:  Warm and dry.  EXTREMITIES:  Without edema.  NEUROLOGIC:  Intact throughout.  PSYCHIATRIC:  She is anxious.    INITIAL ASSESSMENT:  A patient who states that she has been out of her anxiety medicines.  She was   given 15 tablets 9 days ago.  Before that, she had been 14 tablets on 06/03,   fifteen at the end of April and the last prescription before that was in   December.  I do not think she is at risk for seizures due to withdrawal.  She   has had very sporadic short courses of benzos.  I ask her about her trip to   [facility] yesterday.  She had never mentioned to me anything about abdominal   pain, but that is why she was there yesterday.      I have reviewed her  PMP and  she has got addresses in [place], 3 different   addresses in [place], she has got an address in [place], [place], another   address in [place].  She has had 43 prescriptions mostly for opioids over the   last year from 71 different prescribers, using multiple different pharmacies.    I have indicated to her upfront that I am uncomfortable with her misuse of   medications.  I have indicated that we will do what we can to take care of her   without writing prescriptions for controlled substances, as I believe that is   best left to a primary care physician.  We are going to try to get her an   appointment with the [facility] and will treat her symptoms here today.  This is a   new problem for this patient.     CONTINUED BY Dixie Dials, MD:    MEDICAL DECISION MAKING AND HOSPITAL COURSE:  The patient was given oral Ativan followed by oral clonidine. She received    oral Inderal and Fioricet.  She is feeling better over time. She spoke with   Mr. Jimmye Norman. Mr. Jimmye Norman arranged  for followup on Friday. I have informed her   that she is not going to receive controlled substances prescriptions here;   however, she then asked for Ambien.  I did agree that for 7 days.  I have   asked her to follow with the [facility] about her medication abuse.  She states that   she will. She ultimately looks well. She will be sent home.     DIAGNOSES:   1. Acute anxiety.   2. Acute cephalgia.   3. Drug seeking behavior.      ___________________  Christiana Pellant MD  Dictated By: Marland Kitchen     My signature above authenticates this document and my orders, the final   diagnosis (es), discharge prescription (s), and instructions in the Epic   record.  If you have any questions please contact [phone].    Nursing notes have been reviewed by the physician/ advanced practice   clinician.    BB  D:09/25/2014 12:23:46  T: 09/25/2014 12:42:14  [id number]

## 2014-09-25 NOTE — Progress Notes (Signed)
RN [facility]ole[facility]ny [facility] to ask if I could help patient by filling her prescription.  LC stated that she would go talk to the patient.  Patient has no PCP/no insurance.  Spoke to patient bedside and shared with her [facility] information, script savings card and financial assistance application.  Patient's husband was with the patient and stated that they have no income as he is trying to get disability as he has a kidney issue.  The patient states that she stays home to take care of him so there is no money coming in to the house.  They have no transportation.  Family pays for there housing at $400 a month and they get food stamps.  LC seen the notes in patients file that [facility] has worked with this patient most recently last month.  Patient does not follow through with medical appointments as is noted in this file.  LC Delorise ShinerGrace researched on [employer] the scripts that were going to be given to the patient.  Propranolol LA was $10.50 for 10 capsules and generic for Janit Bernmbian was less than $6.41called- LC spoke to her supervisor,Vanessa, who stated that [facility] would not fill the prescriptions as patient has not followed through on her end.  Erie NoeVanessa stated that patient needed to go to the [facility] on Monday and be seen and get a prescription in her hand and then [facility]inerGrace and let her know what pharmacy she was going to and Erie NoeVanessa would meet her there and [facility] would pay for it.  LC told patient who was very upset LC told patient that she would work on it to see if I could get it resolved told patient if she gets discharged to wait in the lobby and I would meet her out there- LC Delorise ShinerGrace called Erie NoeVanessa back and relayed to her that patient was upset.  Erie NoeVanessa stated for [facility] to go ahead and fill the script for HTN.  LC went back to the patients room and she had been discharged.  Walked out to lobby and the entry area outside and patient was no where to be found.  [facility] for her and her husband to get home.

## 2014-09-25 NOTE — ED Notes (Signed)
Pt states she ran out of anxiety medication. Pt states the migraine started last night. Denies having migraine medication. Pt states this feels like the previous migraines

## 2014-09-25 NOTE — ED Notes (Signed)
5:12 PM  09/25/2014     Discharge instructions given to patient (name) with verbalization of understanding. Patient accompanied by husband.  Patient discharged with the following prescriptions ambien and elavil. Patient discharged to home (destination).      CESAR SILO, RN
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
## 2014-09-30 NOTE — Progress Notes (Signed)
Patient called in this morning stating that [facility] and they would not help her get the script for Klonopin filled as it is a narcotic.  Patient states that the [facility] told her to [facility] back [facility].  LC told patient that [facility] will not see her due to the fact that she does not have a chronic issue and they only see patients with chronic conditions.  [facility]  Called [facility] L/M on a/m waiting on Ms. Jean RosenthalJackson to return my call.

## 2014-10-02 NOTE — Progress Notes (Signed)
[facility] returned call but did not leave a msg.  Called patient to ask why she needs to see a gyn- mailbox is full unable to leave a msg.  Sent patient a letter.

## 2014-10-06 ENCOUNTER — Inpatient Hospital Stay
Admit: 2014-10-06 | Discharge: 2014-10-06 | Disposition: A | Discharge status: Home / Self Care | Payer: Self-pay | Attending: Emergency Medicine

## 2014-10-06 DIAGNOSIS — K088 Other specified disorders of teeth and supporting structures: Secondary | ICD-10-CM

## 2014-10-06 LAB — URINALYSIS W/ RFLX MICROSCOPIC
Bilirubin: NEGATIVE
Glucose: NEGATIVE mg/dL
Ketone: NEGATIVE mg/dL
Nitrites: NEGATIVE
Protein: >1000 mg/dL — AB
Specific gravity: 1.022 (ref 1.005–1.030)
Urobilinogen: 0.2 EU/dL (ref 0.2–1.0)
pH (UA): 6 (ref 5.0–8.0)

## 2014-10-06 LAB — URINE MICROSCOPIC ONLY

## 2014-10-06 MED ORDER — HYDROCODONE-ACETAMINOPHEN 5 MG-325 MG TAB
5-325 mg | ORAL | Status: AC
Start: 2014-10-06 — End: 2014-10-06
  Administered 2014-10-06: 17:00:00 via ORAL

## 2014-10-06 MED ORDER — NITROFURANTOIN MACROCRYSTAL 100 MG CAP
100 mg | ORAL_CAPSULE | Freq: Two times a day (BID) | ORAL | Status: AC
Start: 2014-10-06 — End: 2014-10-13

## 2014-10-06 MED ORDER — PENICILLIN V-K 500 MG TAB
500 mg | ORAL_TABLET | Freq: Four times a day (QID) | ORAL | Status: AC
Start: 2014-10-06 — End: 2014-10-16

## 2014-10-06 MED ORDER — HYDROCODONE-ACETAMINOPHEN 5 MG-325 MG TAB
5-325 mg | ORAL_TABLET | ORAL | Status: DC
Start: 2014-10-06 — End: 2014-12-06

## 2014-10-06 MED FILL — HYDROCODONE-ACETAMINOPHEN 5 MG-325 MG TAB: 5-325 mg | ORAL | Qty: 1

## 2014-10-06 NOTE — ED Notes (Signed)
Pt. C/o lower abdominal pain, urinary frequency with pain for 2 days, Also c/o toothache left lower jaw for 3 days

## 2014-10-06 NOTE — ED Notes (Signed)
I have reviewed discharge instructions with the patient.  The patient verbalized understanding.

## 2014-10-06 NOTE — ED Notes (Cosign Needed)
I am requesting to see Dr. Mellody LifeB. Rubenstein because he took real good care of my husband.  I think I need a pelvic exam because I have another UTI but I may also have an ovary issue because it has previously had cysts on it and twisted (right side).  Assisted in Triage of patient and referred to main treatment area due to severity of complaint.  Initial orders started and patient assisted to back by Triage RN.   Ellin Goodie. R. Aryel Edelen, FNP-C

## 2014-10-06 NOTE — ED Provider Notes (Signed)
[place]  [facility] EMERGENCY DEPT       44 y.o. female with noted past medical history who presents to the emergency department who 3 months ago chipped a tooth and was initially painful then but got better.  Now having same tooth pain last 1 day.  In addition, the patient now is having 3 days of dysuria and suprapubic to RLQ TTP.  No f/c.     No other complaints.     No current facility-administered medications for this encounter.     Current Outpatient Prescriptions   Medication Sig   ??? propranolol LA (INDERAL LA) 80 mg SR capsule Take 1 Cap by mouth daily as needed.   ??? zolpidem (AMBIEN) 5 mg tablet Take 1 Tab by mouth nightly as needed for Sleep. Max Daily Amount: 5 mg.   ??? levofloxacin (LEVAQUIN) 500 mg tablet Take 1 Tab by mouth daily.   ??? clonazePAM (KLONOPIN) 1 mg tablet Take 1 Tab by mouth two (2) times a day. Max Daily Amount: 2 mg.   ??? etodolac (LODINE) 300 mg capsule Take 300 mg by mouth 2 times daily.   ??? acetaminophen (TYLENOL) 325 mg tablet Take 975 mg by mouth.       Past Medical History   Diagnosis Date   ??? Adult ADHD    ??? Hypertension    ??? Anxiety    ??? Musculoskeletal disorder    ??? Chronic back pain        Past Surgical History   Procedure Laterality Date   ??? Hx gyn       hysterectomy   ??? Hx orthopaedic     ??? Hx tubal ligation     ??? Hx gyn  11/05/2011     VAGINAL LACERATION REPAIR       Family History   Problem Relation Age of Onset   ??? Family history unknown: Yes       History     Social History   ??? Marital Status: MARRIED     Spouse Name: N/A   ??? Number of Children: N/A   ??? Years of Education: N/A     Occupational History   ??? Not on file.     Social History Main Topics   ??? Smoking status: Never Smoker    ??? Smokeless tobacco: Never Used   ??? Alcohol Use: Yes   ??? Drug Use: No   ??? Sexual Activity: Not on file     Other Topics Concern   ??? Not on file     Social History Narrative    ** Merged History Encounter **            Allergies   Allergen Reactions   ??? Azithromycin Hives    ??? Codeine Unknown (comments)     itching   ??? Codeine Itching   ??? Erythromycin Unknown (comments)   ??? Erythromycin Nausea and Vomiting       Patient's primary care provider (as noted in EPIC):  None    REVIEW OF SYSTEMS:    Constitutional:  Negative for diaphoresis.  HENT:  Negative for congestion.    Respiratory:  Negative for cough and shortness of breath.    Cardiovascular:  Negative for chest pain and palpitations.   Gastrointestinal:  Negative for diarrhea.  Genitourinary:  Negative for flank pain.   Musculoskeletal:  Negative for back pain.   Skin:  Negative for pallor.   Neurological:  Negative for weakness.     Visit  Vitals   Item Reading   ??? BP 145/84 mmHg   ??? Pulse 97   ??? Temp 99.1 ??F (37.3 ??C)   ??? Resp 16   ??? Ht 5\' 6"  (1.676 m)   ??? Wt 90.719 kg (200 lb)   ??? BMI 32.30 kg/m2   ??? SpO2 99%       PHYSICAL EXAM:    CONSTITUTIONAL:  Alert, in no apparent distress;  well developed;  well nourished.  HEAD:  Normocephalic, atraumatic.  EYES:  EOMI.  Non-icteric sclera.  Normal conjunctiva.  ENTM:  Nose:  no rhinorrhea.  Throat:  no erythema or exudate, mucous membranes moist.  NECK:  No JVD.  Supple  RESPIRATORY:  Chest clear, equal breath sounds, good air movement.  CARDIOVASCULAR:  Regular rate and rhythm.  No murmurs, rubs, or gallops.  GI:  Normal bowel sounds, abdomen soft and non-tender.  No rebound or guarding.  BACK:  Non-tender.  UPPER EXT:  Normal inspection.  LOWER EXT:  No edema, no calf tenderness.  Distal pulses intact.  NEURO:  Moves all four extremities, and grossly normal motor exam.  SKIN:  No rashes;  Normal for age.  PSYCH:  Alert and normal affect.    Oropharynx/ teeth:  Tooth # 17 has focal tenderness to percussion.  There is no fluctuance or abscess.  Oropharynx is otherwise normal and symmetric.       Abnormal lab results from this emergency department encounter:  Labs Reviewed   URINALYSIS W/ RFLX MICROSCOPIC - Abnormal; Notable for the following:     Protein >1000 (*)     Blood LARGE (*)      Leukocyte Esterase MODERATE (*)     All other components within normal limits   URINE MICROSCOPIC ONLY - Abnormal; Notable for the following:     CA Oxalate Crystals 1+ (*)     All other components within normal limits   CULTURE, URINE       Lab values for this patient within approximately the last 12 hours:  Recent Results (from the past 12 hour(s))   URINALYSIS W/ RFLX MICROSCOPIC    Collection Time: 10/06/14 12:05 PM   Result Value Ref Range    Color YELLOW      Appearance CLOUDY      Specific gravity 1.022 1.005 - 1.030      pH (UA) 6.0 5.0 - 8.0      Protein >1000 (A) NEG mg/dL    Glucose NEGATIVE  NEG mg/dL    Ketone NEGATIVE  NEG mg/dL    Bilirubin NEGATIVE  NEG      Blood LARGE (A) NEG      Urobilinogen 0.2 0.2 - 1.0 EU/dL    Nitrites NEGATIVE  NEG      Leukocyte Esterase MODERATE (A) NEG     URINE MICROSCOPIC ONLY    Collection Time: 10/06/14 12:05 PM   Result Value Ref Range    WBC TOO NUMEROUS TO COUNT 0 - 4 /hpf    RBC TOO NUMEROUS TO COUNT 0 - 5 /hpf    CA Oxalate Crystals 1+ (A) NEG       Radiologist and cardiologist interpretations if available at time of this note:  No orders to display       Medication(s) ordered for patient during this emergency visit encounter:  Medications - No data to display    IMPRESSION AND MEDICAL DECISION MAKING:  Based upon the patient???s presentation with noted HPI and PE, along  with the work up done in the emergency department, I believe that the patient is having a toothache with no signs of infection/ abscess at this time.     DIAGNOSIS:  1. Toothache  2. Urinary tract infection.    SPECIFIC PATIENT INSTRUCTIONS FROM THE PHYSICIAN WHO TREATED YOU IN THE ER TODAY  1.  Return if any concerns or worsening of condition(s)  2.  Penicillin and macrodantin as prescribed until finished.  Vicodin for pain not controlled with over the counter Tylenol.    3.  Follow up with your dentist or a dental clinic from the sheets given to you in the ER today as soon as possible.     Arlys JohnBrian L. Westley Footsubenstein, M.D.  AB[facility] Board Certified Emergency Physician    Provider Attestation:  If a scribe was utilized in generation of this patient record, I personally performed the services described in the documentation, reviewed the documentation, as recorded by the scribe in my presence, and it accurately records the patient's history of presenting illness, review of systems, patient physical examination, and procedures performed by me as the attending physician.     Arlys JohnBrian L. Westley Footsubenstein, M.D.  AB[facility] Board Certified Emergency Physician  10/06/2014.  1:11 PM

## 2014-10-09 LAB — CULTURE, URINE
Culture result:: 80000
Culture result:: 80000
Culture result:: >100000

## 2014-12-06 ENCOUNTER — Ambulatory Visit
Admit: 2014-12-06 | Discharge: 2014-12-06 | Payer: PRIVATE HEALTH INSURANCE | Attending: Physician Assistant | Primary: Nurse Practitioner

## 2014-12-06 ENCOUNTER — Encounter: Attending: Physician Assistant | Primary: Nurse Practitioner

## 2014-12-06 ENCOUNTER — Encounter: Attending: Family Medicine | Primary: Nurse Practitioner

## 2014-12-06 DIAGNOSIS — R1030 Lower abdominal pain, unspecified: Secondary | ICD-10-CM

## 2014-12-06 MED ORDER — HYDROCODONE-ACETAMINOPHEN 5 MG-325 MG TAB
5-325 mg | ORAL_TABLET | Freq: Four times a day (QID) | ORAL | 0 refills | Status: DC | PRN
Start: 2014-12-06 — End: 2015-01-03

## 2014-12-06 NOTE — Progress Notes (Signed)
HISTORY OF PRESENT ILLNESS  [name] is a 44 y.o. female.  HPI  [name] is a 44 y.o. female who presents to the office today for lower abdominal pain.  She is new to our practice. She did not have a PCP prior to coming here because she did not have health insurance. She has frequented the ED and free clinics for the last 5 months with c/o abdominal pain. She has a hx of uterine fibroids s/p partial hysterectomy 2 years ago with Dr. Guadlupe Spanish. She has both ovaries and has had "problems" with the right ovary since then. I do not have prior GYN records available at the time of her visit. She also has a hx of anxiety and was recently on clonidine and trazodone, but is no longer taking any medication. She says she was never diagnosed with HTN, but records from Ozella Rocks, NP in August 2016 visit state patient is taking clonidine for HTN. She says this was for anxiety and sleep only.     Her abdominal pain has been going on for 6 months. It waxes and wanes and is described as a sharp stabbing pain on right and left lower quadrants of the abdomen. She has tried heating pads, tylenol, motrin, naprosyn without relief. There is no radiation, no UTI symptoms, no vaginal discharge or concerns for STDs, no vaginal bleeding, no fevers chills aching, N/V, C/D, bloating or gas. She has had multiple studies for this problem. Several vaginal ultrasounds (most recent 08/22/14) showed normal ovaries without mass or lesions and absent uterus and cervix. CT of the abdomen showed a normal appendix and Ilium, and normal ovaries as well. STD's have been negative.   She has tried to go back to Dr. Guadlupe Spanish now that she has insurance, but that group does not take medicaid.     Review of FH reveals that her father died of colon cancer at the age of 43. She has never had a colonoscopy due to lack of insurance. She denies blood in stool, dark stools, weight loss, fevers, bloating, issues with BMs, or early satiety.     Chief Complaint    Patient presents with   ??? Abdominal Pain     Current Outpatient Prescriptions   Medication Sig   ??? ibuprofen (MOTRIN IB) 200 mg tablet Take  by mouth every six (6) hours as needed for Pain.   ??? acetaminophen (TYLENOL) 325 mg tablet Take 975 mg by mouth.     No current facility-administered medications for this visit.        Allergies   Allergen Reactions   ??? Codeine Unknown (comments)     itching   ??? Codeine Itching   ??? Erythromycin Unknown (comments)   ??? Erythromycin Nausea and Vomiting   ??? Tramadol Nausea and Vomiting       Past Medical History   Diagnosis Date   ??? Adult ADHD    ??? Anxiety    ??? Chronic back pain    ??? Hypertension    ??? Musculoskeletal disorder      History   Smoking Status   ??? Never Smoker   Smokeless Tobacco   ??? Never Used     History   Alcohol Use   ??? Yes     Family History   Problem Relation Age of Onset   ??? Hypertension Mother    ??? Heart Disease Mother    ??? Cancer Father      died of colon cancer  at 45       Review of Systems   Constitutional: Negative for chills, diaphoresis, fever and malaise/fatigue.   Respiratory: Negative for shortness of breath.    Cardiovascular: Negative for chest pain, palpitations and leg swelling.   Gastrointestinal: Positive for abdominal pain. Negative for blood in stool, constipation, diarrhea, nausea and vomiting.   Genitourinary: Negative for dysuria, flank pain, frequency, hematuria and urgency.   Musculoskeletal: Positive for back pain.        Chronic low back pain   Neurological: Negative for dizziness.   Endo/Heme/Allergies: Does not bruise/bleed easily.   Psychiatric/Behavioral: Negative for depression. The patient is not nervous/anxious.        Visit Vitals   ??? BP 130/90 (BP 1 Location: Left arm, BP Patient Position: Sitting)  Comment: manual   ??? Pulse (!) 104   ??? Temp 98.5 ??F (36.9 ??C) (Oral)   ??? Resp 18   ??? Ht 5\' 6"  (1.676 m)   ??? Wt 209 lb 9.6 oz (95.1 kg)   ??? SpO2 96%   ??? BMI 33.83 kg/m2       Physical Exam    Constitutional: She is oriented to person, place, and time. She appears well-developed and well-nourished. No distress.   Cardiovascular: Normal rate, regular rhythm and normal heart sounds.    No murmur heard.  Pulmonary/Chest: Effort normal and breath sounds normal. She has no wheezes. She has no rales.   Abdominal: Soft. Bowel sounds are normal. She exhibits no distension and no mass. There is no hepatosplenomegaly. There is tenderness in the right lower quadrant and left lower quadrant. There is guarding. There is no rebound, no CVA tenderness and no tenderness at McBurney's point.       Genitourinary:   Genitourinary Comments: Patient declined   Musculoskeletal: She exhibits no edema.   Neurological: She is alert and oriented to person, place, and time.   Psychiatric: She has a normal mood and affect. Her behavior is normal. Thought content normal.   Nursing note and vitals reviewed.      ASSESSMENT and PLAN    ICD-10-CM ICD-9-CM    1. Lower abdominal pain R10.30 789.09 REFERRAL TO GYNECOLOGY   2. History of partial hysterectomy Z98.89 V45.89 REFERRAL TO GYNECOLOGY   3. Elevated blood pressure reading without diagnosis of hypertension R03.0 796.2    4. Family history of colon cancer Z80.0 V16.0       I have initiated the referral to GYN. During this visit that patient did not want to have another pelvis US ordered since she had so many in the past, declined STD and other vaginal tests because they were also done in the ED for the same stomach pain. She will take Norco as needed for the pain plus apply a heating pad. I will not manage this chronic pain and if the source of pain is GU, she will need further treatment and management from GYN.   *Reviewed PMP. Last prescription for Norco was 11/26/14 by her dentist for tooth extraction, she was given 20 tabs. She has 9 different home addresses listed, as well as 16 different pharmacies who dispensed  prescriptions. She has been counseled in July regarding her medication misuse, but Ms. Deutschman states to me that she is no longer taking her prescribed anxiety and sleep medication.     Her BP improved in the office. I would like to see her in 2 weeks to recheck this. Monitor BP at home or at a local  pharmacy prior to your next visit.   She declined a referral for a colonoscopy at this time. She wants to wait until her current issue resolves. I informed her that it is very important to have this colonoscopy early with her FH of colon cancer, this could also be a cause of the chronic pain. She verbalized understanding but insists this pain is from her ovaries.    Reviewed medication and side effects. Patient agrees with the plan and verbalizes understanding.     Follow-up Disposition:  Return in about 2 weeks (around 12/20/2014) for elevated blood pressure.    Lennox Grumbles, PA-C  12/06/2014

## 2014-12-06 NOTE — Patient Instructions (Addendum)
Abdominal Pain: Care Instructions  Your Care Instructions     Abdominal pain has many possible causes. Some aren't serious and get better on their own in a few days. Others need more testing and treatment. If your pain continues or gets worse, you need to be rechecked and may need more tests to find out what is wrong. You may need surgery to correct the problem.  Don't ignore new symptoms, such as fever, nausea and vomiting, urination problems, pain that gets worse, and dizziness. These may be signs of a more serious problem.  Your doctor may have recommended a follow-up visit in the next 8 to 12 hours. If you are not getting better, you may need more tests or treatment.  The doctor has checked you carefully, but problems can develop later. If you notice any problems or new symptoms, get medical treatment right away.  Follow-up care is a key part of your treatment and safety. Be sure to make and go to all appointments, and call your doctor if you are having problems. It's also a good idea to know your test results and keep a list of the medicines you take.  How can you care for yourself at home?  ?? Rest until you feel better.  ?? To prevent dehydration, drink plenty of fluids, enough so that your urine is light yellow or clear like water. Choose water and other caffeine-free clear liquids until you feel better. If you have kidney, heart, or liver disease and have to limit fluids, talk with your doctor before you increase the amount of fluids you drink.  ?? If your stomach is upset, eat mild foods, such as rice, dry toast or crackers, bananas, and applesauce. Try eating several small meals instead of two or three large ones.  ?? Wait until 48 hours after all symptoms have gone away before you have spicy foods, alcohol, and drinks that contain caffeine.  ?? Do not eat foods that are high in fat.  ?? Avoid anti-inflammatory medicines such as aspirin, ibuprofen (Advil,  Motrin), and naproxen (Aleve). These can cause stomach upset. Talk to your doctor if you take daily aspirin for another health problem.  When should you call for help?  Call 911 anytime you think you may need emergency care. For example, call if:  ?? You passed out (lost consciousness).  ?? You pass maroon or very bloody stools.  ?? You vomit blood or what looks like coffee grounds.  ?? You have new, severe belly pain.  Call your doctor now or seek immediate medical care if:  ?? Your pain gets worse, especially if it becomes focused in one area of your belly.  ?? You have a new or higher fever.  ?? Your stools are black and look like tar, or they have streaks of blood.  ?? You have unexpected vaginal bleeding.  ?? You have symptoms of a urinary tract infection. These may include:  ?? Pain when you urinate.  ?? Urinating more often than usual.  ?? Blood in your urine.  ?? You are dizzy or lightheaded, or you feel like you may faint.  Watch closely for changes in your health, and be sure to contact your doctor if:  ?? You are not getting better after 1 day (24 hours).  Where can you learn more?  Go to http://www.healthwise.net/GoodHelpConnections  Enter E907 in the search box to learn more about "Abdominal Pain: Care Instructions."  ?? 2006-2016 Health[employer]. Care instructions adapted under license by [employer]   [employer] (which disclaims liability or warranty for this information). This care instruction is for use with your licensed healthcare professional. If you have questions about a medical condition or this instruction, always ask your healthcare professional. Healthwise, Incorporated disclaims any warranty or liability for your use of this information.  Content Version: 10.9.538570; Current as of: Aug 10, 2013        Elevated Blood Pressure: Care Instructions  Your Care Instructions     Blood pressure is a measure of how hard the blood pushes against the walls  of your arteries. It's normal for blood pressure to go up and down throughout the day. But if it stays up over time, you have high blood pressure.  Two numbers tell you your blood pressure. The first number is the systolic pressure. It shows how hard the blood pushes when your heart is pumping. The second number is the diastolic pressure. It shows how hard the blood pushes between heartbeats, when your heart is relaxed and filling with blood. An ideal blood pressure in adults is less than 120/80 (say "120 over 80"). High blood pressure is 140/90 or higher. You have high blood pressure if your top number is 140 or higher or your bottom number is 90 or higher, or both.  The main test for high blood pressure is simple, fast, and painless. To diagnose high blood pressure, your doctor will test your blood pressure at different times. You may have to check your blood pressure at home if there is reason to think that the results in the doctor's office aren't accurate.  If you are diagnosed with high blood pressure, you can work with your doctor to make a long-term plan to manage it.  Follow-up care is a key part of your treatment and safety. Be sure to make and go to all appointments, and call your doctor if you are having problems. It's also a good idea to know your test results and keep a list of the medicines you take.  How can you care for yourself at home?  ?? Do not smoke. Smoking increases your risk for heart attack and stroke. If you need help quitting, talk to your doctor about stop-smoking programs and medicines. These can increase your chances of quitting for good.  ?? Stay at a healthy weight.  ?? Try to limit how much sodium you eat to less than 2,300 milligrams (mg) a day. Your doctor may ask you to try to eat less than 1,500 mg a day.  ?? Be physically active. Get at least 30 minutes of exercise on most days of the week. Walking is a good choice. You also may want to do other  activities, such as running, swimming, cycling, or playing tennis or team sports.  ?? Avoid or limit alcohol. Talk to your doctor about whether you can drink any alcohol.  ?? Eat plenty of fruits, vegetables, and low-fat dairy products. Eat less saturated and total fats.  ?? Learn how to check your blood pressure at home.  When should you call for help?  Call your doctor now or seek immediate medical care if:  ?? Your blood pressure is much higher than normal (such as 180/110 or higher).  ?? You think high blood pressure is causing symptoms such as:  ?? Severe headache.  ?? Blurry vision.  Watch closely for changes in your health, and be sure to contact your doctor if:  ?? You do not get better as expected.  Where can you  learn more?  Go to InsuranceStats.ca  Enter 804-376-3937 in the search box to learn more about "Elevated Blood Pressure: Care Instructions."  ?? 2006-2016 Healthwise, [employer]. Care instructions adapted under license by [employer] (which disclaims liability or warranty for this information). This care instruction is for use with your licensed healthcare professional. If you have questions about a medical condition or this instruction, always ask your healthcare professional. Healthwise, Incorporated disclaims any warranty or liability for your use of this information.  Content Version: 10.9.538570; Current as of: April 17, 2014

## 2014-12-06 NOTE — Progress Notes (Signed)
[name] is a 44 y.o. female here for referral to OBGYN, hx of cysts on R ovary, in a lot of pain, has been ongoing for about 6 months. Used to see Dr. Guadlupe Spanish at [facility] group but no longer accepts her insurance.

## 2014-12-09 ENCOUNTER — Encounter: Attending: Primary Care | Primary: Nurse Practitioner

## 2014-12-10 ENCOUNTER — Inpatient Hospital Stay
Admit: 2014-12-10 | Discharge: 2014-12-11 | Disposition: A | Discharge status: Home / Self Care | Payer: Self-pay | Attending: Emergency Medicine

## 2014-12-10 DIAGNOSIS — F419 Anxiety disorder, unspecified: Secondary | ICD-10-CM

## 2014-12-10 LAB — DRUG SCREEN, URINE
Amphetamine: NEGATIVE
Barbiturates: NEGATIVE
Benzodiazepines: POSITIVE — AB
Cocaine: POSITIVE — AB
Marijuana: NEGATIVE
Methadone: NEGATIVE
Opiates: POSITIVE — AB
Phencyclidine: NEGATIVE

## 2014-12-10 LAB — POC CHEM8
BUN: 10 mg/dl (ref 7–25)
CALCIUM,IONIZED: 4.9 mg/dL (ref 4.40–5.40)
CO2, TOTAL: 21 mmol/L (ref 21–32)
Chloride: 105 mEq/L (ref 98–107)
Creatinine: 0.7 mg/dl (ref 0.6–1.3)
Glucose: 106 mg/dL (ref 74–106)
HCT: 41 % (ref 38–45)
HGB: 13.9 gm/dl (ref 13.0–17.2)
Potassium: 3.8 mEq/L (ref 3.5–4.9)
Sodium: 140 mEq/L (ref 136–145)

## 2014-12-10 LAB — ETHYL ALCOHOL: ALCOHOL(ETHYL),SERUM: <3 mg/dl (ref 0.0–9.0)

## 2014-12-10 MED ORDER — LORAZEPAM 1 MG TAB
1 mg | ORAL | Status: AC
Start: 2014-12-10 — End: 2014-12-10
  Administered 2014-12-10: 22:00:00 via ORAL

## 2014-12-10 MED ORDER — PROPRANOLOL 80 MG 24 HR SUSTAINED ACTION CAP
80 mg | Freq: Every day | ORAL | Status: DC
Start: 2014-12-10 — End: 2014-12-11

## 2014-12-10 MED ORDER — ACETAMINOPHEN 325 MG TABLET
325 mg | ORAL | Status: AC
Start: 2014-12-10 — End: 2014-12-10
  Administered 2014-12-10: 18:00:00 via ORAL

## 2014-12-10 MED ORDER — LORAZEPAM 1 MG TAB
1 mg | ORAL | Status: DC
Start: 2014-12-10 — End: 2014-12-10

## 2014-12-10 MED ORDER — CLONAZEPAM 1 MG TAB
1 mg | ORAL | Status: AC
Start: 2014-12-10 — End: 2014-12-10
  Administered 2014-12-10: 23:00:00 via ORAL

## 2014-12-10 MED FILL — ACETAMINOPHEN 325 MG TABLET: 325 mg | ORAL | Qty: 3

## 2014-12-10 MED FILL — CLONAZEPAM 1 MG TAB: 1 mg | ORAL | Qty: 1

## 2014-12-10 MED FILL — PROPRANOLOL 80 MG 24 HR SUSTAINED ACTION CAP: 80 mg | ORAL | Qty: 1

## 2014-12-10 MED FILL — LORAZEPAM 1 MG TAB: 1 mg | ORAL | Qty: 1

## 2014-12-10 NOTE — ED Notes (Signed)
Crisis Mental Health in for evaluation

## 2014-12-10 NOTE — Progress Notes (Signed)
PSYCH UPDATE NOTE: the patient was psychiatrically assessed by the [facility] pre-screener, John last night. John agreed to support [facility] funding for the patient to go into inpatient crisis stabilization when a bed is open . The patient is also noted to already have a planned  Outpatient psychiatric appointment with her [facility] outpatient psychiatrist, Dr Tressia Danas on  This coming Thursday at 1600. Later in the evening the patient  Was repeatedly calling her spouse and continuing her verbal struggle with the spouse on the phone. The patient was continuously very loud with her weeping after she was encouraged to lower her voice in order to maintain a therapeutic milieu. Eventually Probation officer approached the patient and told her that she needed to decrease her stress level by decreasing the stimuli that is triggering her upset emotions. Writer told the patient we were taking her phone to end her current need to continue her verbal fight with her spouse. The patient cried louder and demanded to leave. Writer call the [employer] pre-screener, Jenny Reichmann . John returned to the unit , met on 1:1 with the patient and then did a safety plan with the patient to return home and to wait for a possible open crisis bed. The patient was discharged to home with instructions to phone the [facility] for F/U with the [facility]  today, Wednesday 12-11-14.

## 2014-12-10 NOTE — ED Notes (Signed)
Pt is crying and acting hysterical while being on her cell phone talking to her. Pt was informed that her belongings will need to be secured. Psych in to reassess pt and updated her status.     Next appointment on Thurs with Dr.Gaddis psych. Pt will be going home on a safety plan. Pt states that she will call [facility] in the AM as well.
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
## 2014-12-10 NOTE — ED Triage Notes (Addendum)
Pt states for 5 days she has been out of control, breaking things in house, crying uncontrollably. States cannot remember things she has done and "it seems like a dream." States threw medication out a few days ago. Dropped off at ED by [facility] case manager.
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
## 2014-12-10 NOTE — ED Notes (Signed)
10:08 PM  12/10/14     Discharge instructions given to [name] (name) with verbalization of understanding. Patient accompanied by self.  Patient discharged with the following prescriptions -NONE. Patient discharged to Home via cab (destination).      [name]    Denies SI and HI    Stable at this time

## 2014-12-11 MED ORDER — ACETAMINOPHEN 325 MG TABLET
325 mg | ORAL | Status: AC
Start: 2014-12-11 — End: 2014-12-10
  Administered 2014-12-11: 01:00:00 via ORAL

## 2014-12-11 MED FILL — ACETAMINOPHEN 325 MG TABLET: 325 mg | ORAL | Qty: 3

## 2014-12-12 ENCOUNTER — Ambulatory Visit: Payer: Self-pay | Primary: Nurse Practitioner

## 2014-12-12 ENCOUNTER — Encounter

## 2014-12-12 ENCOUNTER — Ambulatory Visit
Admit: 2014-12-12 | Discharge: 2014-12-12 | Payer: PRIVATE HEALTH INSURANCE | Attending: Physician Assistant | Primary: Nurse Practitioner

## 2014-12-12 DIAGNOSIS — R1031 Right lower quadrant pain: Secondary | ICD-10-CM

## 2014-12-12 NOTE — Telephone Encounter (Signed)
Left message for pt to return call re: CT scan. I was able to get her scheduled for it at [facility] at 1:30 tomorrow, nothing to eat or drink after 10AM. 1:30 was the earliest they had available. Unable to reach pt to give her this information.

## 2014-12-12 NOTE — Progress Notes (Signed)
HISTORY OF PRESENT ILLNESS  [name] is a 44 y.o. female.  HPI  [name] is a 44 y.o. female who presents to the office today for abdominal pain.  She comes in today with worsening RLQ pain. This has been going on for 6+ months and has been seen for this same problem multiples times in the ED. She was first seen by myself on 12/06/14. She was prescribed Norco for pain and referred to GYN, which has not been scheduled yet. She states the Norco works if she takes 2 tabs at once. For the last 3 days the abdominal pain has worsened in the RLQ. There is now associated nausea and loss of appetite. She denies fever, chills, vomiting, change in bowel habits, blood in stool, vaginal bleeding or discharge. In May she had a negative CT of the abdomen, and multiple vaginal ultrasounds which showed normal ovaries.     Review of records indicate she was in the ED on 12/10/2014 for psych evaluation. She was diagnosed with acute manic episode and bipolar exacerbation. A urine drug screen was positive for cocaine, opioids and barbituates. She says she is followed by [facility], [facility], and is currently only taking klonopin. Her follow up appt is in November. She was evaluated crisis manager in the ED and the plan was for the patient to go into inpatient crisis stabilization when a bed is open with [facility].       Chief Complaint   Patient presents with   ??? Abdominal Pain       Current Outpatient Prescriptions on File Prior to Visit   Medication Sig Dispense Refill   ??? propranolol LA (INDERAL LA) 80 mg SR capsule Take 80 mg by mouth daily.     ??? clonazePAM (KLONOPIN) 1 mg tablet Take 1 mg by mouth three (3) times daily.     ??? HYDROcodone-acetaminophen (NORCO) 5-325 mg per tablet Take 1 Tab by mouth every six (6) hours as needed for Pain. Max Daily Amount: 4 Tabs. 30 Tab 0   ??? ibuprofen (MOTRIN IB) 200 mg tablet Take  by mouth every six (6) hours as needed for Pain.      ??? acetaminophen (TYLENOL) 325 mg tablet Take 975 mg by mouth.       No current facility-administered medications on file prior to visit.      Allergies   Allergen Reactions   ??? Codeine Unknown (comments)     itching   ??? Codeine Itching   ??? Erythromycin Unknown (comments)   ??? Erythromycin Nausea and Vomiting   ??? Tramadol Nausea and Vomiting     Past Medical History   Diagnosis Date   ??? Adult ADHD    ??? Anxiety    ??? Chronic back pain    ??? Hypertension    ??? Musculoskeletal disorder      History   Smoking Status   ??? Never Smoker   Smokeless Tobacco   ??? Never Used     History   Alcohol Use   ??? Yes     Family History   Problem Relation Age of Onset   ??? Hypertension Mother    ??? Heart Disease Mother    ??? Cancer Father      died of colon cancer at 52       Review of Systems   Constitutional: Positive for malaise/fatigue. Negative for chills, diaphoresis and fever.   Respiratory: Negative for shortness of breath.    Cardiovascular: Negative  for chest pain, palpitations and leg swelling.   Gastrointestinal: Positive for abdominal pain and nausea. Negative for blood in stool, constipation, diarrhea, melena and vomiting.        Loss of appetite   Genitourinary: Negative for dysuria, flank pain, frequency, hematuria and urgency.   Musculoskeletal: Negative for back pain.        Chronic low back pain   Neurological: Negative for dizziness.   Endo/Heme/Allergies: Does not bruise/bleed easily.   Psychiatric/Behavioral: Positive for depression and substance abuse. The patient is not nervous/anxious.        Visit Vitals   ??? BP 106/66   ??? Pulse 73   ??? Temp 97.6 ??F (36.4 ??C) (Oral)   ??? Resp 18   ??? Ht  (1.676 m)   ??? Wt 207 lb 12.8 oz (94.3 kg)  Comment: current weight on file does not appear current   ??? SpO2 99%   ??? BMI 33.54 kg/m2       Physical Exam   Constitutional: She is oriented to person, place, and time. She appears well-developed and well-nourished. She appears distressed.    Cardiovascular: Normal rate, regular rhythm and normal heart sounds.    No murmur heard.  Pulmonary/Chest: Effort normal and breath sounds normal. She has no wheezes. She has no rales.   Abdominal: Soft. Normal appearance and bowel sounds are normal. She exhibits no distension and no mass. There is no hepatosplenomegaly. There is tenderness in the right lower quadrant and left lower quadrant. There is guarding. There is no rebound, no CVA tenderness and no tenderness at McBurney's point.       Genitourinary:   Genitourinary Comments: Patient declined   Musculoskeletal: She exhibits no edema.   Neurological: She is alert and oriented to person, place, and time.   Psychiatric: She has a normal mood and affect. Her behavior is normal. Thought content normal.   Nursing note and vitals reviewed.      ASSESSMENT and PLAN  [name] was seen today for abdominal pain.    Diagnoses and all orders for this visit:    RLQ abdominal pain  -     CT ABD PELV W CONT; Future    Nausea  -     CT ABD PELV W CONT; Future    Bipolar affective disorder, remission status unspecified (HCC)       -   She will follow up with [facility] and continue medical therapy as prescribed by psych.       I discussed with the patient that I want to r/o appendicitis. We were able to schedule for a CT abd/pelvis today, but the patient stated she could not go and would prefer tomorrow. If she develops worsening pain and new concerning symptoms, she needs to go right to the ED. I also had my office schedule her appt with GYN. This will be with Dr. Alcide Clever on Monday 12/16/14.   As for pain control, She declined toradol IM, toradol oral and ibuprofen  oral. I reviewed her UDS results from the ED on 12/10/14 which was positive for cocaine. With her past hx of medication misuse, and the recent drug screen results, I informed the patient that I will not prescribed opioids/narcotics.   PMP shows last prescription was from myself on 9/16 for Norco #30. She is  currently out of medication.  Patient informs me that she has an appt with [facility] in November despite the ED notes stating follow up appointments on 9/21 and  9/22. Will request office notes.  Reviewed medication and side effects. Patient agrees with the plan and verbalizes understanding.   I have spent over 25 minutes in face to face time with this pt in discussion with respect to the aforementioned problems, diagnoses and management plans. >50% of the time was spent counseling and coordinating care.       Follow-up Disposition:  Return if symptoms worsen or fail to improve.    Lennox Grumbles, PA-C  12/12/2014

## 2014-12-12 NOTE — Patient Instructions (Signed)
Abdominal Pain: Care Instructions  Your Care Instructions     Abdominal pain has many possible causes. Some aren't serious and get better on their own in a few days. Others need more testing and treatment. If your pain continues or gets worse, you need to be rechecked and may need more tests to find out what is wrong. You may need surgery to correct the problem.  Don't ignore new symptoms, such as fever, nausea and vomiting, urination problems, pain that gets worse, and dizziness. These may be signs of a more serious problem.  Your doctor may have recommended a follow-up visit in the next 8 to 12 hours. If you are not getting better, you may need more tests or treatment.  The doctor has checked you carefully, but problems can develop later. If you notice any problems or new symptoms, get medical treatment right away.  Follow-up care is a key part of your treatment and safety. Be sure to make and go to all appointments, and call your doctor if you are having problems. It's also a good idea to know your test results and keep a list of the medicines you take.  How can you care for yourself at home?  ?? Rest until you feel better.  ?? To prevent dehydration, drink plenty of fluids, enough so that your urine is light yellow or clear like water. Choose water and other caffeine-free clear liquids until you feel better. If you have kidney, heart, or liver disease and have to limit fluids, talk with your doctor before you increase the amount of fluids you drink.  ?? If your stomach is upset, eat mild foods, such as rice, dry toast or crackers, bananas, and applesauce. Try eating several small meals instead of two or three large ones.  ?? Wait until 48 hours after all symptoms have gone away before you have spicy foods, alcohol, and drinks that contain caffeine.  ?? Do not eat foods that are high in fat.  ?? Avoid anti-inflammatory medicines such as aspirin, ibuprofen (Advil,  Motrin), and naproxen (Aleve). These can cause stomach upset. Talk to your doctor if you take daily aspirin for another health problem.  When should you call for help?  Call 911 anytime you think you may need emergency care. For example, call if:  ?? You passed out (lost consciousness).  ?? You pass maroon or very bloody stools.  ?? You vomit blood or what looks like coffee grounds.  ?? You have new, severe belly pain.  Call your doctor now or seek immediate medical care if:  ?? Your pain gets worse, especially if it becomes focused in one area of your belly.  ?? You have a new or higher fever.  ?? Your stools are black and look like tar, or they have streaks of blood.  ?? You have unexpected vaginal bleeding.  ?? You have symptoms of a urinary tract infection. These may include:  ?? Pain when you urinate.  ?? Urinating more often than usual.  ?? Blood in your urine.  ?? You are dizzy or lightheaded, or you feel like you may faint.  Watch closely for changes in your health, and be sure to contact your doctor if:  ?? You are not getting better after 1 day (24 hours).  Where can you learn more?  Go to http://www.healthwise.net/[employer]  Enter E907 in the search box to learn more about "Abdominal Pain: Care Instructions."  ?? 2006-2016 [employer]. Care instructions adapted under license by [employer] (which disclaims liability or warranty for this information). This care instruction is for use with your licensed healthcare professional. If you have questions about a medical condition or this instruction, always ask your healthcare professional. Healthwise, Incorporated disclaims any warranty or liability for your use of this information.  Content Version: 10.9.538570; Current as of: Aug 10, 2013

## 2014-12-12 NOTE — Progress Notes (Signed)
[name] is a 44 y.o. female here for continued pain, has gotten much worse, the pain is making her nauseated.

## 2014-12-13 ENCOUNTER — Inpatient Hospital Stay: Payer: Self-pay | Attending: Physician Assistant | Primary: Nurse Practitioner

## 2014-12-13 NOTE — Telephone Encounter (Signed)
Call from husband asking if Monique hasGabriel Rungiewed from the message from his wife earlier asking for Gabapentin. Advised him that Gabriel Rung has patients and will review her messages as soon as she has the opportunity, once she does we will call pt to let her know what Gabriel Rung says. Husband voiced understanding and stated that his wife is in pain and he just wants to help her.

## 2014-12-13 NOTE — Telephone Encounter (Signed)
I have looked in her past prescriptions and did not see gabapentin. This is typically not a medication that would be prescribed for stomach pain. She has a hx of back pain and radiculopathy down both legs, possibly was prescribed for that reason.

## 2014-12-13 NOTE — Telephone Encounter (Signed)
Tried again to contact pt re: CT scan scheduled for today at [facility]. Left message at the cell phone number provided and when I called the work number provided, I was told that there was no one there by that name.

## 2014-12-13 NOTE — Telephone Encounter (Signed)
Returned call to pt regarding last telephone encounter. Per Gabriel Rung "I have looked in her past prescriptions and did not see gabapentin. This is typically not a medication that would be prescribed for stomach pain. She has a hx of back pain and radiculopathy down both legs, possibly was prescribed for that reason."  Pt voiced understanding.

## 2014-12-13 NOTE — Telephone Encounter (Signed)
Pt returned call and states that her insurance will not cover for her to get an outpatient CT scan. She is going to have someone take her to the ED tomorrow morning. In addition, she talked with Mrs. Mal Amabile about different pain meds yesterday and states that she remembers she used to take Gabapentin  4 x daily and it worked well for her; is this a med that can be prescribed to her?

## 2015-01-03 ENCOUNTER — Inpatient Hospital Stay
Admit: 2015-01-03 | Discharge: 2015-01-03 | Disposition: A | Discharge status: Home / Self Care | Payer: Self-pay | Attending: Emergency Medicine

## 2015-01-03 DIAGNOSIS — K029 Dental caries, unspecified: Secondary | ICD-10-CM

## 2015-01-03 MED ORDER — CLINDAMYCIN 150 MG CAP
150 mg | ORAL | Status: AC
Start: 2015-01-03 — End: 2015-01-03
  Administered 2015-01-03: 14:00:00 via ORAL

## 2015-01-03 MED ORDER — CLINDAMYCIN 300 MG CAP
300 mg | ORAL_CAPSULE | Freq: Four times a day (QID) | ORAL | 0 refills | Status: AC
Start: 2015-01-03 — End: 2015-01-10

## 2015-01-03 MED ORDER — OXYCODONE-ACETAMINOPHEN 5 MG-325 MG TAB
5-325 mg | ORAL | Status: AC
Start: 2015-01-03 — End: 2015-01-03
  Administered 2015-01-03: 14:00:00 via ORAL

## 2015-01-03 MED ORDER — OXYCODONE-ACETAMINOPHEN 5 MG-325 MG TAB
5-325 mg | ORAL_TABLET | ORAL | 0 refills | Status: DC | PRN
Start: 2015-01-03 — End: 2015-02-22

## 2015-01-03 MED FILL — CLINDAMYCIN 150 MG CAP: 150 mg | ORAL | Qty: 2

## 2015-01-03 MED FILL — OXYCODONE-ACETAMINOPHEN 5 MG-325 MG TAB: 5-325 mg | ORAL | Qty: 1

## 2015-01-03 NOTE — ED Triage Notes (Signed)
Pt c/o waking up & her mouth is swollen.  Pt with halitosis & c/o tooth pain.

## 2015-01-03 NOTE — ED Notes (Signed)
I have reviewed discharge instructions with the patient.  The patient verbalized understanding.  Patient armband removed and shredded  Pt left ED ambulatory, alert and in NAD.

## 2015-01-03 NOTE — ED Provider Notes (Addendum)
HPI Comments: 10:01 AM [name] is a 44 y.o. female presenting to the ED with L upper dental pain and facial swelling that begin this morning. Pt also reports pain under the L eye as an associated symptom. Pt states that area felt tender to touch last night but there was no noted swelling. When she woke up this morning her face was swollen and the pain had worsened. Pt is seeing a dentist, [facility], for extractions but has not had the extraction done yet. Pt denies nausea, vomiting, diarrhea, fever, CP, and cough. Pt took Motrin and Tylenol with no relief and states the pain is worsened when opening her mouth.  No other complaints at this time.                 Patient is a 44 y.o. female presenting with dental problem.   Dental Pain             Past Medical History:   Diagnosis Date   ??? Adult ADHD    ??? Anxiety    ??? Chronic back pain    ??? Hypertension    ??? Musculoskeletal disorder        Past Surgical History:   Procedure Laterality Date   ??? Hx gyn       hysterectomy   ??? Hx orthopaedic     ??? Hx tubal ligation     ??? Hx gyn  11/05/2011     VAGINAL LACERATION REPAIR         Family History:   Problem Relation Age of Onset   ??? Hypertension Mother    ??? Heart Disease Mother    ??? Cancer Father      died of colon cancer at 43       Social History     Social History   ??? Marital status: MARRIED     Spouse name: N/A   ??? Number of children: N/A   ??? Years of education: N/A     Occupational History   ??? Not on file.     Social History Main Topics   ??? Smoking status: Never Smoker   ??? Smokeless tobacco: Never Used   ??? Alcohol use Yes   ??? Drug use: No   ??? Sexual activity: Not on file     Other Topics Concern   ??? Not on file     Social History Narrative    ** Merged History Encounter **              ALLERGIES: Codeine; Codeine; Erythromycin; Erythromycin; and Tramadol    Review of Systems   Constitutional: Negative for diaphoresis, fatigue and fever.   HENT: Positive for dental problem and facial swelling. Negative for  congestion, drooling, ear pain, rhinorrhea and sore throat.    Eyes: Negative for discharge and redness.   Respiratory: Negative for cough, shortness of breath, wheezing and stridor.    Cardiovascular: Negative for chest pain, palpitations and leg swelling.   Gastrointestinal: Negative for abdominal pain, constipation, diarrhea, nausea and vomiting.   Genitourinary: Negative for dysuria, flank pain, frequency and hematuria.   Musculoskeletal: Negative for back pain, myalgias, neck pain and neck stiffness.   Skin: Negative for rash and wound.   Neurological: Negative for dizziness, seizures, syncope, light-headedness, numbness and headaches.   All other systems reviewed and are negative.      Vitals:    01/03/15 0955   BP: (!) 140/94   Pulse: 76  Resp: 18   Temp: 98.2 ??F (36.8 ??C)   SpO2: 100%   Weight: 96.2 kg (212 lb)   Height: 5\' 6"  (1.676 m)            Physical Exam   Constitutional: She is oriented to person, place, and time. She appears well-developed and well-nourished. She appears distressed.   Moderately distressed   HENT:   Head: Normocephalic.   Mouth/Throat: Oropharynx is clear and moist. No oropharyngeal exudate.   L sided facial edema noted over the L maxillary region. No definite intraoral abscess noted. Dental caries noted in the L upper molar region. Airway is patent, able to clear secretions.     Neck: Normal range of motion. Neck supple.   Cardiovascular: Normal rate, regular rhythm and normal heart sounds.  Exam reveals no gallop and no friction rub.    No murmur heard.  Pulmonary/Chest: Effort normal and breath sounds normal. No stridor. No respiratory distress. She has no wheezes. She has no rales.   Musculoskeletal: Normal range of motion.   Neurological: She is alert and oriented to person, place, and time.   Gait is steady. Able to ambulate without difficulty.     Skin: Skin is warm and dry. No rash noted. She is not diaphoretic. No erythema.    Psychiatric: She has a normal mood and affect. Her behavior is normal. Thought content normal.   Nursing note and vitals reviewed.       MDM  Number of Diagnoses or Management Options  Diagnosis management comments: Impression: dental pain, dental caries, facial swelling, htn     Clindamycin and percocet given in the ED    Patient is stable for discharge at this time. Rx for clindamycin and percocet given. Rest and follow-up with dentist this week. Return to the ED immediately for any new or worsening sx.  Keeyon Privitera J Kitai Purdom, PA-C 10:22 AM     Risk of Complications, Morbidity, and/or Mortality  Presenting problems: low  Diagnostic procedures: minimal  Management options: minimal    Patient Progress  Patient progress: stable    ED Course       Procedures    Vitals:  Patient Vitals for the past 12 hrs:   Temp Pulse Resp BP SpO2   01/03/15 0955 98.2 ??F (36.8 ??C) 76 18 (!) 140/94 100 %   Pulsox interpreted within normal limits.       Medications ordered:   Medications - No data to display      Progress notes, Consult notes or additional Procedure notes:   10:06 AM Pt reevaluated at this time and is resting comfortably in NAD. Discussed results and findings, as well as, diagnosis and plan for discharge. Pt verbalizes understanding and agreement with plan. All questions addressed at this time.        Disposition:  Diagnosis: No diagnosis found.    Disposition: discharged    Follow-up Information     None           Patient's Medications   Start Taking    No medications on file   Continue Taking    ACETAMINOPHEN (TYLENOL) 325 MG TABLET    Take 975 mg by mouth.    CLONAZEPAM (KLONOPIN) 1 MG TABLET    Take 1 mg by mouth three (3) times daily.    HYDROCODONE-ACETAMINOPHEN (NORCO) 5-325 MG PER TABLET    Take 1 Tab by mouth every six (6) hours as needed for Pain. Max Daily Amount: 4 Tabs.  IBUPROFEN (MOTRIN IB) 200 MG TABLET    Take  by mouth every six (6) hours as needed for Pain.     PROPRANOLOL LA (INDERAL LA) 80 MG SR CAPSULE    Take 80 mg by mouth daily.   These Medications have changed    No medications on file   Stop Taking    No medications on file       SCRIBE ATTESTATION STATEMENT  Documented by: Earnestine Leys scribing for, and in the presence of, Prabhav Faulkenberry, [place] 10:04 AM       PROVIDER ATTESTATION STATEMENT  I personally performed the services described in the documentation, reviewed the documentation, as recorded by the scribe in my presence, and it accurately and completely records my words and actions.  Zykeriah Mathia [place], [place]

## 2015-01-05 ENCOUNTER — Inpatient Hospital Stay
Admit: 2015-01-05 | Discharge: 2015-01-05 | Disposition: A | Discharge status: Home / Self Care | Payer: Self-pay | Attending: Emergency Medicine

## 2015-01-05 ENCOUNTER — Emergency Department: Admit: 2015-01-05 | Payer: Self-pay | Primary: Nurse Practitioner

## 2015-01-05 DIAGNOSIS — K047 Periapical abscess without sinus: Secondary | ICD-10-CM

## 2015-01-05 LAB — URINALYSIS W/ RFLX MICROSCOPIC
Bilirubin: NEGATIVE
Blood: NEGATIVE
Glucose: NEGATIVE mg/dL
Ketone: NEGATIVE mg/dL
Nitrites: POSITIVE — AB
Protein: NEGATIVE mg/dL
Specific gravity: 1.019 (ref 1.005–1.030)
Urobilinogen: 1 EU/dL (ref 0.2–1.0)
pH (UA): 5.5 (ref 5.0–8.0)

## 2015-01-05 LAB — CBC WITH AUTOMATED DIFF
ABS. BASOPHILS: 0 10*3/uL (ref 0.0–0.06)
ABS. EOSINOPHILS: 0.1 10*3/uL (ref 0.0–0.4)
ABS. LYMPHOCYTES: 2 10*3/uL (ref 0.9–3.6)
ABS. MONOCYTES: 0.8 10*3/uL (ref 0.05–1.2)
ABS. NEUTROPHILS: 5.5 10*3/uL (ref 1.8–8.0)
BASOPHILS: 0 % (ref 0–2)
EOSINOPHILS: 2 % (ref 0–5)
HCT: 38.8 % (ref 35.0–45.0)
HGB: 12.9 g/dL (ref 12.0–16.0)
LYMPHOCYTES: 24 % (ref 21–52)
MCH: 29.5 PG (ref 24.0–34.0)
MCHC: 33.2 g/dL (ref 31.0–37.0)
MCV: 88.6 FL (ref 74.0–97.0)
MONOCYTES: 10 % (ref 3–10)
MPV: 10.5 FL (ref 9.2–11.8)
NEUTROPHILS: 64 % (ref 40–73)
PLATELET: 267 10*3/uL (ref 135–420)
RBC: 4.38 M/uL (ref 4.20–5.30)
RDW: 12.5 % (ref 11.6–14.5)
WBC: 8.4 10*3/uL (ref 4.6–13.2)

## 2015-01-05 LAB — METABOLIC PANEL, BASIC
Anion gap: 8 mmol/L (ref 3.0–18)
BUN/Creatinine ratio: 13 (ref 12–20)
BUN: 10 MG/DL (ref 7.0–18)
CO2: 30 mmol/L (ref 21–32)
Calcium: 9.2 MG/DL (ref 8.5–10.1)
Chloride: 101 mmol/L (ref 100–108)
Creatinine: 0.78 MG/DL (ref 0.6–1.3)
GFR est AA: >60 mL/min/{1.73_m2} (ref >60)
GFR est non-AA: >60 mL/min/{1.73_m2} (ref >60)
Glucose: 78 mg/dL (ref 74–99)
Potassium: 3.7 mmol/L (ref 3.5–5.5)
Sodium: 139 mmol/L (ref 136–145)

## 2015-01-05 LAB — URINE MICROSCOPIC ONLY: WBC: 6 /hpf (ref 0–4)

## 2015-01-05 LAB — HCG URINE, QL: HCG urine, QL: NEGATIVE

## 2015-01-05 MED ORDER — SODIUM CHLORIDE 0.9% BOLUS IV
0.9 % | Freq: Once | INTRAVENOUS | Status: AC
Start: 2015-01-05 — End: 2015-01-05
  Administered 2015-01-05: 21:00:00 via INTRAVENOUS

## 2015-01-05 MED ORDER — OMEPRAZOLE 20 MG CAP, DELAYED RELEASE
20 mg | ORAL_CAPSULE | Freq: Every day | ORAL | 0 refills | Status: AC
Start: 2015-01-05 — End: 2015-01-19

## 2015-01-05 MED ORDER — MORPHINE 4 MG/ML SYRINGE
4 mg/mL | Freq: Once | INTRAMUSCULAR | Status: AC
Start: 2015-01-05 — End: 2015-01-05
  Administered 2015-01-05: 21:00:00 via INTRAVENOUS

## 2015-01-05 MED ORDER — IBUPROFEN 800 MG TAB
800 mg | ORAL_TABLET | Freq: Four times a day (QID) | ORAL | 0 refills | Status: AC | PRN
Start: 2015-01-05 — End: 2015-01-12

## 2015-01-05 MED ORDER — PHENOBARB-HYOSCYAMN-ATROPINE-SCOP 16.2 MG-0.1037 MG/5 ML (5 ML) ELIXIR
16.2 mg-0.1037 mg/5 mL (5 mL) | Freq: Once | ORAL | Status: AC
Start: 2015-01-05 — End: 2015-01-05
  Administered 2015-01-05: 21:00:00 via ORAL

## 2015-01-05 MED ORDER — NITROFURANTOIN (25% MACROCRYSTAL FORM) 100 MG CAP
100 mg | ORAL_CAPSULE | Freq: Two times a day (BID) | ORAL | 0 refills | Status: AC
Start: 2015-01-05 — End: 2015-01-10

## 2015-01-05 MED ORDER — OXYCODONE-ACETAMINOPHEN 5 MG-325 MG TAB
5-325 mg | ORAL_TABLET | ORAL | 0 refills | Status: DC | PRN
Start: 2015-01-05 — End: 2015-02-22

## 2015-01-05 MED ORDER — IOPAMIDOL 61 % IV SOLN
300 mg iodine /mL (61 %) | Freq: Once | INTRAVENOUS | Status: AC
Start: 2015-01-05 — End: 2015-01-05
  Administered 2015-01-05: 21:00:00 via INTRAVENOUS

## 2015-01-05 MED ORDER — MORPHINE 4 MG/ML SYRINGE
4 mg/mL | Freq: Once | INTRAMUSCULAR | Status: AC
Start: 2015-01-05 — End: 2015-01-05
  Administered 2015-01-05: 23:00:00 via INTRAVENOUS

## 2015-01-05 MED ORDER — CLINDAMYCIN 600 MG/4 ML IV
6004 mg/4 mL | INTRAVENOUS | Status: AC
Start: 2015-01-05 — End: 2015-01-05
  Administered 2015-01-05: 22:00:00 via INTRAVENOUS

## 2015-01-05 MED ORDER — LIDOCAINE 2 % MUCOSAL SOLN
2 % | Status: DC | PRN
Start: 2015-01-05 — End: 2015-01-05

## 2015-01-05 MED FILL — ISOVUE-300  61 % INTRAVENOUS SOLUTION: 300 mg iodine /mL (61 %) | INTRAVENOUS | Qty: 100

## 2015-01-05 MED FILL — MORPHINE 4 MG/ML SYRINGE: 4 mg/mL | INTRAMUSCULAR | Qty: 1

## 2015-01-05 MED FILL — SODIUM CHLORIDE 0.9 % IV: INTRAVENOUS | Qty: 1000

## 2015-01-05 MED FILL — DONNATAL 16.2 MG-0.1037 MG/5 ML (5 ML) ORAL ELIXIR: 16.2 mg-0.1037 mg/5 mL (5 mL) | ORAL | Qty: 10

## 2015-01-05 MED FILL — CLEOCIN 600 MG/4 ML INTRAVENOUS SOLUTION: 600 mg/4 mL | INTRAVENOUS | Qty: 4

## 2015-01-05 NOTE — Progress Notes (Signed)
Awaiting Sensitivity.  Gifford ShaveElizabeth M Charmagne Buhl, [place]PA 2:54 PM

## 2015-01-05 NOTE — Progress Notes (Signed)
Started patient on Macrobid in ED.  Sensitivities pending.

## 2015-01-05 NOTE — ED Provider Notes (Signed)
HPI Comments: [name] is a 44 y.o. female with a pertinent history of chronic back pain, HTN, anxiety, ADHD who presents to the emergency department for evaluation of left sided facial swelling and increased pain since her visit here 2 days ago for same.  Pt relates she is out of her percocet.  She has been taking the clindamycin as prescribed.  She states she also feels like everything is getting stuck in her chest when she swallows.  No dysphagia currently.  Pt has follow-up with dentist at [facility] in 8 days.  Pt denies any fevers or chills, headache, dizziness or light headedness, ENT issues, CP or discomfort, SOB, cough, n/v/d/c, abd pain, back pain, diaphoresis, melena/hematochezia, dysuria, hematuria, frequency, focal weakness/numbness/tingling, or rash.  Patient has no other complaints at this time.    PCP: None      Patient is a 44 y.o. female presenting with dental problem.   Dental Pain             Past Medical History:   Diagnosis Date   ??? Adult ADHD    ??? Anxiety    ??? Chronic back pain    ??? Hypertension    ??? Musculoskeletal disorder        Past Surgical History:   Procedure Laterality Date   ??? Hx gyn       hysterectomy   ??? Hx orthopaedic     ??? Hx tubal ligation     ??? Hx gyn  11/05/2011     VAGINAL LACERATION REPAIR         Family History:   Problem Relation Age of Onset   ??? Hypertension Mother    ??? Heart Disease Mother    ??? Cancer Father      died of colon cancer at 67       Social History     Social History   ??? Marital status: MARRIED     Spouse name: N/A   ??? Number of children: N/A   ??? Years of education: N/A     Occupational History   ??? Not on file.     Social History Main Topics   ??? Smoking status: Never Smoker   ??? Smokeless tobacco: Never Used   ??? Alcohol use Yes   ??? Drug use: No   ??? Sexual activity: Not on file     Other Topics Concern   ??? Not on file     Social History Narrative    ** Merged History Encounter **               ALLERGIES: Codeine; Codeine; Erythromycin; Erythromycin; and Tramadol    Review of Systems   Constitutional: Negative for chills and fever.   HENT: Positive for dental problem, facial swelling and trouble swallowing. Negative for congestion, rhinorrhea and sore throat.    Respiratory: Negative for cough and shortness of breath.    Cardiovascular: Negative for chest pain.   Gastrointestinal: Negative for abdominal pain, blood in stool, constipation, diarrhea, nausea and vomiting.   Genitourinary: Negative for dysuria, frequency and hematuria.   Musculoskeletal: Negative for back pain and myalgias.   Skin: Negative for rash and wound.   Neurological: Negative for dizziness, weakness, numbness and headaches.   Psychiatric/Behavioral: Negative for agitation and behavioral problems.       Vitals:    01/05/15 1500 01/05/15 1507   BP: 119/86    Pulse:  89   Resp: 16    Temp:  98.1 ??F (36.7 ??C)    SpO2: 98%    Weight: 96.2 kg (212 lb)    Height: 5\' 6"  (1.676 m)             Physical Exam   Constitutional: She is oriented to person, place, and time. She appears well-developed and well-nourished.   HENT:   Head: Normocephalic and atraumatic.   Nose: Nose normal.   Mouth/Throat: Oropharynx is clear and moist. No oropharyngeal exudate.   Moderate left-sided facial swelling.  Diffusely poor dentition.  No localized induration or fluctuance on oral exam to suggest easily drainable abscess. No sublingual or submandibular induration.  Pt with full ROM of neck.     Eyes: Conjunctivae are normal. Pupils are equal, round, and reactive to light. Right eye exhibits no discharge. Left eye exhibits no discharge.   Neck: Normal range of motion. Neck supple. No thyromegaly present.   Cardiovascular: Normal rate, normal heart sounds and intact distal pulses.  Exam reveals no gallop and no friction rub.    No murmur heard.  Pulmonary/Chest: Effort normal and breath sounds normal. No respiratory  distress. She has no wheezes. She has no rales. She exhibits no tenderness.   Lymphadenopathy:     She has no cervical adenopathy.   Neurological: She is alert and oriented to person, place, and time.   Skin: Skin is warm and dry. No rash noted.   Psychiatric: She has a normal mood and affect. Her behavior is normal.   Nursing note and vitals reviewed.       MDM  Number of Diagnoses or Management Options  Acute cystitis without hematuria: new and requires workup  Facial abscess: new and requires workup  Diagnosis management comments: Differential Diagnosis:  Dentalgia, dental caries, dental fracture, periodontal abscess/cellulitis, gingivitis, facial abscess/cellulitis    Plan:  Pt presents ambulatory in minimal distress, well-hydrated, non-toxic in appearance, with normal vitals.  Exam reveals diffusely poor dentition without localized induration or fluctuance inside mouth to suggest easily drainable abscess. No sublingual or submandibular induration.  Pt with full ROM of neck. Low suspicion for Ludwig's angina or deep space infection.  Labs and CT ordered and pending.    CT demonstrates dental abscesses but no facial abscess.  UA shows infection.  Labs are otherwise unremarkable. Recent urine culture demonstrates sensitivity to macrobid.  Will DC with macrobid.  Clindamycin IV here for infection.  Fluids and pain meds.  Will DC home with clindamycin, few percocet, motrin.  Pt is strongly advised to follow-up with dentist in short period of time as they will need definitive management.      At this time, patient is stable and appropriate for discharge home.  Patient demonstrates understanding of current diagnoses and is in agreement with the treatment plan.  They are advised that while the likelihood of serious underlying condition is low at this point given the evaluation performed today, we cannot fully rule it out.  They are advised to immediately return with any new symptoms or worsening of current  condition.  All questions have been answered.  Patient is given educational material regarding their diagnoses, including danger symptoms and when to return to the ED.         Amount and/or Complexity of Data Reviewed  Clinical lab tests: reviewed and ordered  Tests in the radiology section of CPT??: ordered and reviewed  Review and summarize past medical records: yes    Risk of Complications, Morbidity, and/or Mortality  Presenting problems: moderate  Diagnostic procedures: high  Management options: moderate    Patient Progress  Patient progress: improved    ED Course       Procedures    -------------------------------------------------------------------------------------------------------------------  Orders:  Orders Placed This Encounter   ??? CULTURE, URINE     Standing Status:   Standing     Number of Occurrences:   1     Order Specific Question:   Reason for Culture     Answer:   Other   ??? CT MAXILLOFACIAL W CONT     Standing Status:   Standing     Number of Occurrences:   1     Order Specific Question:   Transport     Answer:   Wheelchair [7]     Order Specific Question:   Reason for Exam     Answer:   facial/dental abscess     Order Specific Question:   Is Patient Pregnant?     Answer:   Unknown     Order Specific Question:   Is Patient Allergic to Contrast Dye?     Answer:   Unknown     Order Specific Question:   Does patient have history of Renal Disease?     Answer:   No   ??? CBC WITH AUTOMATED DIFF     Standing Status:   Standing     Number of Occurrences:   1   ??? METABOLIC PANEL, BASIC     Standing Status:   Standing     Number of Occurrences:   1   ??? URINALYSIS W/ RFLX MICROSCOPIC     Standing Status:   Standing     Number of Occurrences:   1   ??? HCG URINE, QL     Standing Status:   Standing     Number of Occurrences:   1   ??? URINE MICROSCOPIC ONLY     Standing Status:   Standing     Number of Occurrences:   1   ??? SALINE LOCK IV ONE TIME STAT     Standing Status:   Standing     Number of Occurrences:   1    ??? sodium chloride 0.9 % bolus infusion 1,000 mL   ??? clindamycin phosphate (CLEOCIN) 600 mg in 0.9% sodium chloride (MBP/ADV) 100 mL ADV     Order Specific Question:   Antibiotic Indications     Answer:   Head and Neck Infection   ??? morphine injection 4 mg   ??? lidocaine (XYLOCAINE) 2 % viscous solution 15 mL   ??? maalox/donnatal (GI COCKTAIL COMPOUND) oral liquid   ??? iopamidol (ISOVUE 300) 61 % contrast injection 50-100 mL   ??? morphine injection 4 mg   ??? oxyCODONE-acetaminophen (PERCOCET) 5-325 mg per tablet     Sig: Take 1 Tab by mouth every four (4) hours as needed for Pain. Max Daily Amount: 6 Tabs.     Dispense:  12 Tab     Refill:  0   ??? ibuprofen (MOTRIN) 800 mg tablet     Sig: Take 1 Tab by mouth every six (6) hours as needed for Pain for up to 7 days.     Dispense:  20 Tab     Refill:  0   ??? nitrofurantoin, macrocrystal-monohydrate, (MACROBID) 100 mg capsule     Sig: Take 1 Cap by mouth two (2) times a day for 5 days.     Dispense:  10 Cap     Refill:  0        Lab Results:   Recent Results (from the past 12 hour(s))   URINALYSIS W/ RFLX MICROSCOPIC    Collection Time: 01/05/15  4:09 PM   Result Value Ref Range    Color YELLOW      Appearance TURBID      Specific gravity 1.019 1.005 - 1.030      pH (UA) 5.5 5.0 - 8.0      Protein NEGATIVE  NEG mg/dL    Glucose NEGATIVE  NEG mg/dL    Ketone NEGATIVE  NEG mg/dL    Bilirubin NEGATIVE  NEG      Blood NEGATIVE  NEG      Urobilinogen 1.0 0.2 - 1.0 EU/dL    Nitrites POSITIVE (A) NEG      Leukocyte Esterase MODERATE (A) NEG     HCG URINE, QL    Collection Time: 01/05/15  4:09 PM   Result Value Ref Range    HCG urine, Ql. NEGATIVE  NEG     URINE MICROSCOPIC ONLY    Collection Time: 01/05/15  4:09 PM   Result Value Ref Range    WBC 6 to 8 0 - 4 /hpf    RBC NONE 0 - 5 /hpf    Epithelial cells FEW 0 - 5 /lpf    Bacteria 4+ (A) NEG /hpf   CBC WITH AUTOMATED DIFF    Collection Time: 01/05/15  4:15 PM   Result Value Ref Range    WBC 8.4 4.6 - 13.2 K/uL     RBC 4.38 4.20 - 5.30 M/uL    HGB 12.9 12.0 - 16.0 g/dL    HCT 55.7 32.2 - 02.5 %    MCV 88.6 74.0 - 97.0 FL    MCH 29.5 24.0 - 34.0 PG    MCHC 33.2 31.0 - 37.0 g/dL    RDW 42.7 06.2 - 37.6 %    PLATELET 267 135 - 420 K/uL    MPV 10.5 9.2 - 11.8 FL    NEUTROPHILS 64 40 - 73 %    LYMPHOCYTES 24 21 - 52 %    MONOCYTES 10 3 - 10 %    EOSINOPHILS 2 0 - 5 %    BASOPHILS 0 0 - 2 %    ABS. NEUTROPHILS 5.5 1.8 - 8.0 K/UL    ABS. LYMPHOCYTES 2.0 0.9 - 3.6 K/UL    ABS. MONOCYTES 0.8 0.05 - 1.2 K/UL    ABS. EOSINOPHILS 0.1 0.0 - 0.4 K/UL    ABS. BASOPHILS 0.0 0.0 - 0.06 K/UL    DF AUTOMATED     METABOLIC PANEL, BASIC    Collection Time: 01/05/15  4:15 PM   Result Value Ref Range    Sodium 139 136 - 145 mmol/L    Potassium 3.7 3.5 - 5.5 mmol/L    Chloride 101 100 - 108 mmol/L    CO2 30 21 - 32 mmol/L    Anion gap 8 3.0 - 18 mmol/L    Glucose 78 74 - 99 mg/dL    BUN 10 7.0 - 18 MG/DL    Creatinine 2.83 0.6 - 1.3 MG/DL    BUN/Creatinine ratio 13 12 - 20      GFR est AA >60 >60 ml/min/1.24m2    GFR est non-AA >60 >60 ml/min/1.82m2    Calcium 9.2 8.5 - 10.1 MG/DL       Radiology Results:  CT MAXILLOFACIAL W CONT   Final Result   IMPRESSION:  ??  Tooth abscess of the posterior second and third maxillary molar teeth on the  Right.   Additional cavities and tooth extractions as detailed above.  There is mild adenopathy in the left submandibular space, likely reactive. There  is no CT finding for fascial soft tissue abscess at this time.  Mucosal thickening of the left maxillary sinus and to a milder degree in the  right maxillary sinus.       Progress Notes:  3:00 PM:  Michiel Sites, PA-C was at the pt's bedside, assessed the pt and answered the pt's questions regarding treatment.    -------------------------------------------------------------------------------------------------------------------    Disposition:  Diagnosis:   1. Dental abscess    2. Acute cystitis without hematuria        Disposition: DC Home     Follow-up Information     Follow up With Details Comments Contact Info    Your dentist Go to as scheduled for further evaluation and treatment     [facility] EMERGENCY DEPT Go to Please return immediately to the Emergency Room for re-evaluation if you develop any new symptoms or worsening of current symptoms! [address] [place] [address]  [phone]          Patient's Medications   Start Taking    IBUPROFEN (MOTRIN) 800 MG TABLET    Take 1 Tab by mouth every six (6) hours as needed for Pain for up to 7 days.    NITROFURANTOIN, MACROCRYSTAL-MONOHYDRATE, (MACROBID) 100 MG CAPSULE    Take 1 Cap by mouth two (2) times a day for 5 days.    OXYCODONE-ACETAMINOPHEN (PERCOCET) 5-325 MG PER TABLET    Take 1 Tab by mouth every four (4) hours as needed for Pain. Max Daily Amount: 6 Tabs.   Continue Taking    ACETAMINOPHEN (TYLENOL) 325 MG TABLET    Take 975 mg by mouth.    CLINDAMYCIN (CLEOCIN) 300 MG CAPSULE    Take 1 Cap by mouth four (4) times daily for 7 days.    CLONAZEPAM (KLONOPIN) 1 MG TABLET    Take 1 mg by mouth three (3) times daily.    IBUPROFEN (MOTRIN IB) 200 MG TABLET    Take  by mouth every six (6) hours as needed for Pain.    OXYCODONE-ACETAMINOPHEN (PERCOCET) 5-325 MG PER TABLET    Take 1 Tab by mouth every four (4) hours as needed for Pain. Max Daily Amount: 6 Tabs.    PROPRANOLOL LA (INDERAL LA) 80 MG SR CAPSULE    Take 80 mg by mouth daily.   These Medications have changed    No medications on file   Stop Taking    No medications on file

## 2015-01-05 NOTE — ED Triage Notes (Signed)
Patient c/o dental abscess. Has been taking antibiotics but the swelling on left side of face is getting worse.

## 2015-01-05 NOTE — Progress Notes (Signed)
Pt is taking Macrobid which is susceptible. Zaydin Billey J Doralyn Kirkes, PA-C 7:28 AM

## 2015-01-05 NOTE — ED Notes (Signed)
I have reviewed discharge instructions with the patient.  The patient verbalized understanding. Patient armband removed and shredded

## 2015-01-08 LAB — CULTURE, URINE: Culture result:: >100000

## 2015-02-22 ENCOUNTER — Inpatient Hospital Stay
Admit: 2015-02-22 | Discharge: 2015-02-22 | Disposition: A | Discharge status: Home / Self Care | Payer: Self-pay | Attending: Emergency Medicine

## 2015-02-22 DIAGNOSIS — K0889 Other specified disorders of teeth and supporting structures: Secondary | ICD-10-CM

## 2015-02-22 MED ORDER — TRAMADOL 50 MG TAB
50 mg | ORAL | Status: DC
Start: 2015-02-22 — End: 2015-02-22

## 2015-02-22 MED ORDER — TRAMADOL 50 MG TAB
50 mg | ORAL_TABLET | Freq: Four times a day (QID) | ORAL | 0 refills | Status: DC | PRN
Start: 2015-02-22 — End: 2015-06-25

## 2015-02-22 MED ORDER — IBUPROFEN 600 MG TAB
600 mg | ORAL_TABLET | Freq: Three times a day (TID) | ORAL | 0 refills | Status: AC | PRN
Start: 2015-02-22 — End: 2015-03-01

## 2015-02-22 MED ORDER — PENICILLIN V-K 500 MG TAB
500 mg | ORAL_TABLET | Freq: Three times a day (TID) | ORAL | 0 refills | Status: AC
Start: 2015-02-22 — End: 2015-03-01

## 2015-02-22 MED FILL — TRAMADOL 50 MG TAB: 50 mg | ORAL | Qty: 1

## 2015-02-22 NOTE — ED Triage Notes (Signed)
Pt, c/o toothache  Left upper jaw for 3 days

## 2015-02-22 NOTE — ED Provider Notes (Signed)
HPI Comments: 1:22 PM [name] is a 44 y.o. female with a history of HTN, anxiety, and chronic back pain who presents to the ED for evaluation of dental pain that began three days ago. Patient also c/o headache and eye pain. Patient states she had an extraction approximately four weeks ago. [facility] is [facility] two more extractions. Patient denies gum swelling, sore throat, dysphagia, or any other symptoms. There are no other concerns at this time.       The history is provided by the patient.        Past Medical History:   Diagnosis Date   ??? Adult ADHD    ??? Anxiety    ??? Chronic back pain    ??? Hypertension    ??? Musculoskeletal disorder        Past Surgical History:   Procedure Laterality Date   ??? Hx gyn       hysterectomy   ??? Hx orthopaedic     ??? Hx tubal ligation     ??? Hx gyn  11/05/2011     VAGINAL LACERATION REPAIR         Family History:   Problem Relation Age of Onset   ??? Hypertension Mother    ??? Heart Disease Mother    ??? Cancer Father      died of colon cancer at 6345       Social History     Social History   ??? Marital status: MARRIED     Spouse name: N/A   ??? Number of children: N/A   ??? Years of education: N/A     Occupational History   ??? Not on file.     Social History Main Topics   ??? Smoking status: Never Smoker   ??? Smokeless tobacco: Never Used   ??? Alcohol use Yes   ??? Drug use: No   ??? Sexual activity: Not on file     Other Topics Concern   ??? Not on file     Social History Narrative    ** Merged History Encounter **              ALLERGIES: Codeine; Codeine; Erythromycin; Erythromycin; and Tramadol    Review of Systems   HENT: Positive for dental problem. Negative for drooling, facial swelling, sore throat, trouble swallowing and voice change.         (-) gum swelling   Eyes: Positive for pain.   Respiratory: Negative.    Cardiovascular: Negative.    Neurological: Positive for headaches.   All other systems reviewed and are negative.      Vitals:    02/22/15 1300    BP: (!) 144/101   Pulse: (!) 120   Resp: 18   Temp: 98.5 ??F (36.9 ??C)   SpO2: 99%   Weight: 90.7 kg (200 lb)   Height: 5\' 6"  (1.676 m)            Physical Exam   Constitutional: She is oriented to person, place, and time. She appears well-developed and well-nourished. No distress.   HENT:   She has 2 decayed nubs to left upper molars, #15 and 16.  No gingival swelling or erythema.    Neck: Normal range of motion. Neck supple.   Cardiovascular: Regular rhythm and normal heart sounds.  Tachycardia present.    No murmur heard.  Lymphadenopathy:     She has no cervical adenopathy.  Neurological: She is alert and oriented to person, place, and time.   Skin: She is not diaphoretic.   Nursing note and vitals reviewed.       MDM  Number of Diagnoses or Management Options  Diagnosis management comments: 44yo female with dental pain secondary to caries, possible infection.  Numerous ER visits for pain related problems, including dental.  Rx for PCN, motrin, few Ultram.     ED Course       Procedures    Scribe Attestation:    I, Oneal Deputy, scribing for and in the presence of Dellie Catholic, [place] February 22, 2015 at 1:21 PM     Physician Attestation:   I personally performed the services described in this documentation, reviewed and edited the documentation which was dictated to the scribe in my presence, and it accurately records my words and actions. Dellie Catholic, [place]  February 22, 2015 at 1:21 PM

## 2015-02-22 NOTE — ED Notes (Signed)
Came to bring pt medication and DC paperwork and pt was not in RW or Lobby.

## 2015-03-20 NOTE — Telephone Encounter (Signed)
Called, left message. Patient has overdue testing that need to be completed.

## 2015-05-30 ENCOUNTER — Inpatient Hospital Stay: Admit: 2015-05-30 | Payer: Self-pay | Primary: Nurse Practitioner

## 2015-05-30 DIAGNOSIS — Z01818 Encounter for other preprocedural examination: Secondary | ICD-10-CM

## 2015-05-30 LAB — CBC WITH AUTOMATED DIFF
BASOPHILS: 0.5 % (ref 0–3)
EOSINOPHILS: 1.8 % (ref 0–5)
HCT: 40.1 % (ref 37.0–50.0)
HGB: 13.4 gm/dl (ref 13.0–17.2)
IMMATURE GRANULOCYTES: 0.3 % (ref 0.0–3.0)
LYMPHOCYTES: 38.9 % (ref 28–48)
MCH: 29.1 pg (ref 25.4–34.6)
MCHC: 33.4 gm/dl (ref 30.0–36.0)
MCV: 87.2 fL (ref 80.0–98.0)
MONOCYTES: 10.7 % (ref 1–13)
MPV: 10.5 fL — ABNORMAL HIGH (ref 6.0–10.0)
NEUTROPHILS: 47.8 % (ref 34–64)
NRBC: 0 (ref 0–0)
PLATELET: 304 10*3/uL (ref 140–450)
RBC: 4.6 M/uL (ref 3.60–5.20)
RDW-SD: 40.6 (ref 36.4–46.3)
WBC: 6.1 10*3/uL (ref 4.0–11.0)

## 2015-05-30 LAB — EKG, 12 LEAD, INITIAL
Atrial Rate: 67 {beats}/min
Calculated P Axis: 37 degrees
Calculated R Axis: -8 degrees
Calculated T Axis: 6 degrees
Diagnosis: NORMAL
P-R Interval: 158 ms
Q-T Interval: 400 ms
QRS Duration: 88 ms
QTC Calculation (Bezet): 422 ms
Ventricular Rate: 67 {beats}/min

## 2015-06-25 ENCOUNTER — Inpatient Hospital Stay: Admit: 2015-06-25 | Payer: Self-pay | Primary: Nurse Practitioner

## 2015-07-04 ENCOUNTER — Inpatient Hospital Stay: Admit: 2015-07-04 | Payer: Self-pay | Primary: Nurse Practitioner

## 2015-07-04 DIAGNOSIS — Z01818 Encounter for other preprocedural examination: Secondary | ICD-10-CM

## 2015-07-04 LAB — BLOOD TYPE, (ABO+RH)
ABO/Rh(D): A POS
ABO/Rh: A POS

## 2015-07-04 LAB — CBC WITH AUTOMATED DIFF
BASOPHILS: 0.5 % (ref 0–3)
EOSINOPHILS: 3.1 % (ref 0–5)
HCT: 47.1 % (ref 37.0–50.0)
HGB: 15.2 gm/dl (ref 13.0–17.2)
IMMATURE GRANULOCYTES: 0.2 % (ref 0.0–3.0)
LYMPHOCYTES: 33.2 % (ref 28–48)
MCH: 28.3 pg (ref 25.4–34.6)
MCHC: 32.3 gm/dl (ref 30.0–36.0)
MCV: 87.5 fL (ref 80.0–98.0)
MONOCYTES: 8.9 % (ref 1–13)
MPV: 10.8 fL — ABNORMAL HIGH (ref 6.0–10.0)
NEUTROPHILS: 54.1 % (ref 34–64)
NRBC: 0 (ref 0–0)
PLATELET: 267 10*3/uL (ref 140–450)
RBC: 5.38 M/uL — ABNORMAL HIGH (ref 3.60–5.20)
RDW-SD: 42 (ref 36.4–46.3)
WBC: 5.8 10*3/uL (ref 4.0–11.0)

## 2015-07-04 LAB — ANTIBODY SCREEN: Antibody screen: NEGATIVE

## 2015-07-08 ENCOUNTER — Inpatient Hospital Stay: Payer: Self-pay

## 2015-07-08 MED ORDER — GLYCOPYRROLATE 0.2 MG/ML IJ SOLN
0.2 mg/mL | INTRAMUSCULAR | Status: AC
Start: 2015-07-08 — End: ?

## 2015-07-08 MED ORDER — ROCURONIUM 10 MG/ML IV
10 mg/mL | INTRAVENOUS | Status: AC
Start: 2015-07-08 — End: ?

## 2015-07-08 MED ORDER — KETOROLAC TROMETHAMINE 30 MG/ML INJECTION
30 mg/mL (1 mL) | INTRAMUSCULAR | Status: AC
Start: 2015-07-08 — End: 2015-07-08

## 2015-07-08 MED ORDER — HYDROMORPHONE (PF) 1 MG/ML IJ SOLN
1 mg/mL | INTRAMUSCULAR | Status: AC
Start: 2015-07-08 — End: 2015-07-08
  Administered 2015-07-08: 17:00:00 via INTRAVENOUS

## 2015-07-08 MED ORDER — FENTANYL CITRATE (PF) 50 MCG/ML IJ SOLN
50 mcg/mL | INTRAMUSCULAR | Status: AC
Start: 2015-07-08 — End: ?

## 2015-07-08 MED ORDER — HYDROMORPHONE (PF) 1 MG/ML IJ SOLN
1 mg/mL | INTRAMUSCULAR | Status: DC | PRN
Start: 2015-07-08 — End: 2015-07-08
  Administered 2015-07-08 (×2): via INTRAVENOUS

## 2015-07-08 MED ORDER — LIDOCAINE (PF) 20 MG/ML (2 %) IJ SOLN
20 mg/mL (2 %) | INTRAMUSCULAR | Status: AC
Start: 2015-07-08 — End: ?

## 2015-07-08 MED ORDER — IBUPROFEN 600 MG TAB
600 mg | Freq: Four times a day (QID) | ORAL | Status: DC | PRN
Start: 2015-07-08 — End: 2015-07-08

## 2015-07-08 MED ORDER — FENTANYL CITRATE (PF) 50 MCG/ML IJ SOLN
50 mcg/mL | INTRAMUSCULAR | Status: DC | PRN
Start: 2015-07-08 — End: 2015-07-08
  Administered 2015-07-08: 17:00:00 via INTRAVENOUS

## 2015-07-08 MED ORDER — PROMETHAZINE IN NS 6.25 MG/50 ML IV PIGGY BAG
6.25 mg/50 ml | INTRAVENOUS | Status: DC | PRN
Start: 2015-07-08 — End: 2015-07-08

## 2015-07-08 MED ORDER — MIDAZOLAM 1 MG/ML IJ SOLN
1 mg/mL | INTRAMUSCULAR | Status: DC | PRN
Start: 2015-07-08 — End: 2015-07-08
  Administered 2015-07-08: 16:00:00 via INTRAVENOUS

## 2015-07-08 MED ORDER — ONDANSETRON (PF) 4 MG/2 ML INJECTION
4 mg/2 mL | Freq: Once | INTRAMUSCULAR | Status: DC | PRN
Start: 2015-07-08 — End: 2015-07-08

## 2015-07-08 MED ORDER — GLYCOPYRROLATE 0.2 MG/ML IJ SOLN
0.2 mg/mL | INTRAMUSCULAR | Status: DC | PRN
Start: 2015-07-08 — End: 2015-07-08
  Administered 2015-07-08: 16:00:00 via INTRAVENOUS

## 2015-07-08 MED ORDER — LACTATED RINGERS IV
INTRAVENOUS | Status: DC
Start: 2015-07-08 — End: 2015-07-08
  Administered 2015-07-08: 15:00:00 via INTRAVENOUS

## 2015-07-08 MED ORDER — SCOPOLAMINE (1.3-1.5) MG 72 HR TRANSDERM PATCH
1 mg over 3 days | TRANSDERMAL | Status: DC
Start: 2015-07-08 — End: 2015-07-08

## 2015-07-08 MED ORDER — FAMOTIDINE (PF) 20 MG/2 ML IV
20 mg/2 mL | Freq: Two times a day (BID) | INTRAVENOUS | Status: DC
Start: 2015-07-08 — End: 2015-07-08
  Administered 2015-07-08: 15:00:00 via INTRAVENOUS

## 2015-07-08 MED ORDER — SODIUM CHLORIDE 0.9 % IJ SYRG
INTRAMUSCULAR | Status: DC | PRN
Start: 2015-07-08 — End: 2015-07-08

## 2015-07-08 MED ORDER — PROPOFOL 10 MG/ML IV EMUL
10 mg/mL | INTRAVENOUS | Status: DC | PRN
Start: 2015-07-08 — End: 2015-07-08
  Administered 2015-07-08: 16:00:00 via INTRAVENOUS

## 2015-07-08 MED ORDER — KETOROLAC TROMETHAMINE 30 MG/ML INJECTION
30 mg/mL (1 mL) | INTRAMUSCULAR | Status: AC
Start: 2015-07-08 — End: 2015-07-08
  Administered 2015-07-08: 18:00:00 via INTRAVENOUS

## 2015-07-08 MED ORDER — GUM MASTIC-STORAX-METHYLSALICYLATE-ALCOHOL IN A DROPPERETTE
Status: AC
Start: 2015-07-08 — End: ?

## 2015-07-08 MED ORDER — DEXMEDETOMIDINE 100 MCG/ML IV SOLN
100 mcg/mL | INTRAVENOUS | Status: AC
Start: 2015-07-08 — End: ?

## 2015-07-08 MED ORDER — LIDOCAINE (PF) 20 MG/ML (2 %) IJ SOLN
20 mg/mL (2 %) | INTRAMUSCULAR | Status: DC | PRN
Start: 2015-07-08 — End: 2015-07-08
  Administered 2015-07-08: 16:00:00 via INTRAVENOUS

## 2015-07-08 MED ORDER — ROCURONIUM 10 MG/ML IV
10 mg/mL | INTRAVENOUS | Status: DC | PRN
Start: 2015-07-08 — End: 2015-07-08
  Administered 2015-07-08 (×2): via INTRAVENOUS

## 2015-07-08 MED ORDER — ONDANSETRON (PF) 4 MG/2 ML INJECTION
4 mg/2 mL | INTRAMUSCULAR | Status: AC
Start: 2015-07-08 — End: ?

## 2015-07-08 MED ORDER — LIDOCAINE HCL 1 % (10 MG/ML) IJ SOLN
10 mg/mL (1 %) | Freq: Once | INTRAMUSCULAR | Status: AC | PRN
Start: 2015-07-08 — End: 2015-07-08
  Administered 2015-07-08: 15:00:00 via INTRADERMAL

## 2015-07-08 MED ORDER — KETAMINE 50 MG/ML (1 ML) INTRAVENOUS SYRINGE
50 mg/mL (1 mL) | INTRAVENOUS | Status: AC
Start: 2015-07-08 — End: ?

## 2015-07-08 MED ORDER — KETAMINE 50 MG/ML IJ SOLN
50 mg/mL | INTRAMUSCULAR | Status: DC | PRN
Start: 2015-07-08 — End: 2015-07-08
  Administered 2015-07-08: 16:00:00 via INTRAVENOUS

## 2015-07-08 MED ORDER — OXYCODONE-ACETAMINOPHEN 5 MG-325 MG TAB
5-325 mg | Freq: Once | ORAL | Status: AC | PRN
Start: 2015-07-08 — End: 2015-07-08
  Administered 2015-07-08: 07:00:00 via ORAL

## 2015-07-08 MED ORDER — BUPIVACAINE (PF) 0.25 % (2.5 MG/ML) IJ SOLN
0.25 % (2.5 mg/mL) | INTRAMUSCULAR | Status: DC | PRN
Start: 2015-07-08 — End: 2015-07-08
  Administered 2015-07-08: 17:00:00 via SUBCUTANEOUS

## 2015-07-08 MED ORDER — PROPOFOL 10 MG/ML IV EMUL
10 mg/mL | INTRAVENOUS | Status: AC
Start: 2015-07-08 — End: ?

## 2015-07-08 MED ORDER — ONDANSETRON (PF) 4 MG/2 ML INJECTION
4 mg/2 mL | Freq: Four times a day (QID) | INTRAMUSCULAR | Status: DC | PRN
Start: 2015-07-08 — End: 2015-07-08

## 2015-07-08 MED ORDER — MIDAZOLAM 1 MG/ML IJ SOLN
1 mg/mL | INTRAMUSCULAR | Status: AC
Start: 2015-07-08 — End: ?

## 2015-07-08 MED ORDER — FENTANYL CITRATE (PF) 50 MCG/ML IJ SOLN
50 mcg/mL | INTRAMUSCULAR | Status: AC
Start: 2015-07-08 — End: 2015-07-08
  Administered 2015-07-08: 17:00:00 via INTRAVENOUS

## 2015-07-08 MED ORDER — BUPIVACAINE (PF) 0.25 % (2.5 MG/ML) IJ SOLN
0.25 % (2.5 mg/mL) | INTRAMUSCULAR | Status: AC
Start: 2015-07-08 — End: ?

## 2015-07-08 MED ORDER — ONDANSETRON (PF) 4 MG/2 ML INJECTION
4 mg/2 mL | Freq: Once | INTRAMUSCULAR | Status: AC
Start: 2015-07-08 — End: 2015-07-08
  Administered 2015-07-08: 15:00:00 via INTRAVENOUS

## 2015-07-08 MED ORDER — SUGAMMADEX 100 MG/ML INTRAVENOUS SOLUTION
100 mg/mL | INTRAVENOUS | Status: DC | PRN
Start: 2015-07-08 — End: 2015-07-08
  Administered 2015-07-08: 17:00:00 via INTRAVENOUS

## 2015-07-08 MED ORDER — FENTANYL CITRATE (PF) 50 MCG/ML IJ SOLN
50 mcg/mL | INTRAMUSCULAR | Status: DC | PRN
Start: 2015-07-08 — End: 2015-07-08
  Administered 2015-07-08 (×5): via INTRAVENOUS

## 2015-07-08 MED FILL — MIDAZOLAM 1 MG/ML IJ SOLN: 1 mg/mL | INTRAMUSCULAR | Qty: 2

## 2015-07-08 MED FILL — FENTANYL CITRATE (PF) 50 MCG/ML IJ SOLN: 50 mcg/mL | INTRAMUSCULAR | Qty: 2

## 2015-07-08 MED FILL — DEXMEDETOMIDINE 100 MCG/ML IV SOLN: 100 mcg/mL | INTRAVENOUS | Qty: 2

## 2015-07-08 MED FILL — OXYCODONE-ACETAMINOPHEN 5 MG-325 MG TAB: 5-325 mg | ORAL | Qty: 2

## 2015-07-08 MED FILL — KETOROLAC TROMETHAMINE 30 MG/ML INJECTION: 30 mg/mL (1 mL) | INTRAMUSCULAR | Qty: 1

## 2015-07-08 MED FILL — DIPRIVAN 10 MG/ML INTRAVENOUS EMULSION: 10 mg/mL | INTRAVENOUS | Qty: 20

## 2015-07-08 MED FILL — LIDOCAINE (PF) 20 MG/ML (2 %) IJ SOLN: 20 mg/mL (2 %) | INTRAMUSCULAR | Qty: 5

## 2015-07-08 MED FILL — ROCURONIUM 10 MG/ML IV: 10 mg/mL | INTRAVENOUS | Qty: 5

## 2015-07-08 MED FILL — TRANSDERM-SCOP 1 MG OVER 3 DAYS TRANSDERMAL PATCH: 1 mg over 3 days | TRANSDERMAL | Qty: 1

## 2015-07-08 MED FILL — GLYCOPYRROLATE 0.2 MG/ML IJ SOLN: 0.2 mg/mL | INTRAMUSCULAR | Qty: 1

## 2015-07-08 MED FILL — ONDANSETRON (PF) 4 MG/2 ML INJECTION: 4 mg/2 mL | INTRAMUSCULAR | Qty: 2

## 2015-07-08 MED FILL — BD POSIFLUSH NORMAL SALINE 0.9 % INJECTION SYRINGE: INTRAMUSCULAR | Qty: 10

## 2015-07-08 MED FILL — BUPIVACAINE (PF) 0.25 % (2.5 MG/ML) IJ SOLN: 0.25 % (2.5 mg/mL) | INTRAMUSCULAR | Qty: 30

## 2015-07-08 MED FILL — LACTATED RINGERS IV: INTRAVENOUS | Qty: 1000

## 2015-07-08 MED FILL — GUM MASTIC-STORAX-METHYLSALICYLATE-ALCOHOL IN A DROPPERETTE: Qty: 1

## 2015-07-08 MED FILL — SODIUM CHLORIDE 0.9 % INJECTION: INTRAMUSCULAR | Qty: 10

## 2015-07-08 MED FILL — HYDROMORPHONE (PF) 1 MG/ML IJ SOLN: 1 mg/mL | INTRAMUSCULAR | Qty: 1

## 2015-07-08 MED FILL — KETAMINE 50 MG/ML (1 ML) INTRAVENOUS SYRINGE: 50 mg/mL (1 mL) | INTRAVENOUS | Qty: 1

## 2015-07-08 NOTE — Op Note (Signed)
[facility]  Operation Report  NAME:  [name], [name]  SEX:   F  DATE: 07/08/2015  DOB: [birth year]  MR#    [id number]  ROOM:  PL  ACCT#  [id number]    cc: Ila Mcgill MD    PREOPERATIVE DIAGNOSIS:  Pelvic pain and ovarian cyst.    POSTOPERATIVE DIAGNOSIS:  Pelvic pain and ovarian cyst.    PROCEDURE PERFORMED:  Diagnostic laparoscopy with lysis of adhesions.    SURGEON:  Sigmund Hazel, MD    ASSISTANT:  Hilda Lias    ANESTHESIOLOGIST:  Tresa Endo    NURSE ANESTHETIST:  Marcene Corning    ANESTHESIA TYPE:  General.    FLUIDS:  1000 mL crystalloid.    ESTIMATED BLOOD LOSS:  Less than 10 mL    URINE OUTPUT:  Via straight catheter is 250 mL    COMPLICATIONS:  None.    FINDINGS:  Absent uterus, normal ovaries seen bilaterally.  Thin filmy adhesions of the left ovary to the pelvic side wall and a portion of the sigmoid colon was adhesed to the posterior cul-de-sac.    OPERATIVE TIME:  Surgery start is 1214, end time is 1239.    DESCRIPTION OF PROCEDURE:  The patient was seen in preoperative area.  Risks, benefits and alternatives have been reviewed.  The patient expressed she understands and desires to proceed on with the surgical case.  The patient was taken to the operating room and once in the operating room, she was placed on the operating table.  She was then placed under general anesthesia and she was properly positioned on the table with her legs in [place] stirrups.  She was cleaned and draped in a sterile fashion and timeout was done, found be adequate as well as appropriate and then a moist sponge stick was placed in the vaginal vault.  Attention was turned to the abdominal portion of the surgery.  A supraumbilical transverse incision was made with the scalpel.  Abdomen was tented up.  Veress needle was placed.  Opening pressure was found to be 4 mmHg.  The abdomen was insufflated up to 15 mmHg high flow, after which a 5 mm Optiview with 5 mm scope was placed into the incision site going into the abdominal cavity.   Afterwards, the patient was placed in Trendelenburg and two 5 mm trocar sites were placed in the right and left lower quadrant sites.  A. blunt probe was used to remove the intestines from the posterior cul-de-sac, at which point in time the patient had the findings as previously seen.  Thin filmy adhesions seen on the left ovary to the pelvic side wall.  What appeared to be sigmoid colon was found adhesed with thin filmy adhesions and one thick adhesion to the posterior cul-de-sac.  At this point in time, the EndoShears was used with cautery to transect and coagulate the thin filmy adhesions from the left ovary to the pelvic sidewall, thus freeing up the ovary.  The thin filmy adhesions were taken down from a portion of the pericolic fat from the sigmoid colon going to the posterior cul-de-sac adhesion as well.  This was taken down as well as the thickened adhesion was taken down as well.  The area was found to be avascular.  Good hemostasis was seen after the surgical procedure was done and the area was irrigated with sterile water and good hemostasis was seen as well.  Afterwards, the decision was made after this point, the remainder of the pelvic and lower abdominal  cavity, to discontinue the case.  Afterwards, the trocars were removed from the trocar sites and the abdomen was desufflated of CO2 gas.  Subsequently afterwards, the skin incision was closed with 4-0 Monocryl and Dermabond.  The patient had the sponge stick removed from the vaginal vault and then she was awakened from anesthesia after her legs were taken out of [place] stirrups and she was undraped and cleaned.  The patient was taken from the surgical table to the gurney and taken to the recovery area in stable condition.      The patient will be discharged home today.  She will follow up in the practice of [facility] in approximately 2 weeks at her scheduled 2-week postoperative appointment.  She to been given a prescription for Percocet  5/325 mg 1 to 2 tabs every 4 to 6 hours as needed for pain.  She is to follow up in the practice of [facility] [address].      ___________________  Elvera MariaMichael P Bosco Paparella M.D.  Dictated By:.   MLT  D:07/08/2015 12:51:43  T: 07/08/2015 13:09:40  [id number]

## 2015-07-08 NOTE — Anesthesia Pre-Procedure Evaluation (Addendum)
Anesthetic History     PONV     Comments: Difficulty with pain controll post op     Review of Systems / Medical History  Patient summary reviewed, nursing notes reviewed and pertinent labs reviewed    Pulmonary  Within defined limits                 Neuro/Psych             Comments: Chronic pain  Chronic opioids Cardiovascular                Pertinent negatives: No hypertension  Exercise tolerance: >4 METS     GI/Hepatic/Renal  Within defined limits              Endo/Other        Obesity     Other Findings              Physical Exam    Airway  Mallampati: II  TM Distance: 4 - 6 cm  Neck ROM: normal range of motion   Mouth opening: Normal     Cardiovascular  Regular rate and rhythm,  S1 and S2 normal,  no murmur, click, rub, or gallop             Dental    Dentition: Poor dentition     Pulmonary  Breath sounds clear to auscultation               Abdominal  GI exam deferred       Other Findings            Anesthetic Plan    ASA: 2  Anesthesia type: general  Multimodal  Tylenol/toradol/ketamine/precedex        Induction: Intravenous  Anesthetic plan and risks discussed with: Patient      Discussed the anesthesiology plan and consent form including common and serious adverse events in detail.  QA to patient satisfaction.

## 2015-07-08 NOTE — Op Note (Signed)
[facility]  Operation Report  NAME:  [name] [name]  SEX:   F  DATE: 07/08/2015  DOB: 21-Dec-1970  MR#    [id number]  ROOM:  PL  ACCT#  [id number]    cc: Ila Mcgill MD    PREOPERATIVE DIAGNOSIS:  Pelvic pain and ovarian cyst.    POSTOPERATIVE DIAGNOSIS:  Pelvic pain and ovarian cyst.    PROCEDURE PERFORMED:  Diagnostic laparoscopy with lysis of adhesions.    SURGEON:  Sigmund Hazel, MD    ASSISTANT:  Hilda Lias    ANESTHESIOLOGIST:  Tresa Endo    NURSE ANESTHETIST:  Marcene Corning    ANESTHESIA TYPE:  General.    FLUIDS:  1000 mL crystalloid.    ESTIMATED BLOOD LOSS:  Less than 10 mL    URINE OUTPUT:  Via straight catheter is 250 mL    COMPLICATIONS:  None.    FINDINGS:  Absent uterus, normal ovaries seen bilaterally.  Thin filmy adhesions of the left ovary to the pelvic side wall and a portion of the sigmoid colon was adhesed to the posterior cul-de-sac.    OPERATIVE TIME:  Surgery start is 1214, end time is 1239.    DESCRIPTION OF PROCEDURE:  The patient was seen in preoperative area.  Risks, benefits and alternatives have been reviewed.  The patient expressed she understands and desires to proceed on with the surgical case.  The patient was taken to the operating room and once in the operating room, she was placed on the operating table.  She was then placed under general anesthesia and she was properly positioned on the table with her legs in [place] stirrups.  She was cleaned and draped in a sterile fashion and timeout was done, found be adequate as well as appropriate and then a moist sponge stick was placed in the vaginal vault.  Attention was turned to the abdominal portion of the surgery.  A supraumbilical transverse incision was made with the scalpel.  Abdomen was tented up.  Veress needle was placed.  Opening pressure was found to be 4 mmHg.  The abdomen was insufflated up to 15 mmHg high flow, after which a 5 mm Optiview with 5 mm scope was placed  into the incision site going into the abdominal cavity.  Afterwards, the patient was placed in Trendelenburg and two 5 mm trocar sites were placed in the right and left lower quadrant sites.  A. blunt probe was used to remove the intestines from the posterior cul-de-sac, at which point in time the patient had the findings as previously seen.  Thin filmy adhesions seen on the left ovary to the pelvic side wall.  What appeared to be sigmoid colon was found adhesed with thin filmy adhesions and one thick adhesion to the posterior cul-de-sac.  At this point in time, the EndoShears was used with cautery to transect and coagulate the thin filmy adhesions from the left ovary to the pelvic sidewall, thus freeing up the ovary.  The thin filmy adhesions were taken down from a portion of the pericolic fat from the sigmoid colon going to the posterior cul-de-sac adhesion as well.  This was taken down as well as the thickened adhesion was taken down as well.  The area was found to be avascular.  Good hemostasis was seen after the surgical procedure was done and the area was irrigated with sterile water and good hemostasis was seen as well.  Afterwards, the decision was made after this point, the remainder of the pelvic and lower abdominal  cavity, to discontinue the case.  Afterwards, the trocars were removed from the trocar sites and the abdomen was desufflated of CO2 gas.  Subsequently afterwards, the skin incision was closed with 4-0 Monocryl and Dermabond.  The patient had the sponge stick removed from the vaginal vault and then she was awakened from anesthesia after her legs were taken out of [place] stirrups and she was undraped and cleaned.  The patient was taken from the surgical table to the gurney and taken to the recovery area in stable condition.      The patient will be discharged home today.  She will follow up in the practice of [facility] in approximately 2 weeks at her  scheduled 2-week postoperative appointment.  She to been given a prescription for Percocet 5/325 mg 1 to 2 tabs every 4 to 6 hours as needed for pain.  She is to follow up in the [facility] of [facility] [address].      ___________________  Elvera MariaMichael P Aristides Luckey M.D.  Dictated By:.   MLT  D:07/08/2015 12:51:43  T: 07/08/2015 13:09:40  [id number]

## 2015-07-08 NOTE — Other (Signed)
Dr. Tresa EndoKelly at bedside. Aware of meds given and drowsiness. Order received for Toradol.

## 2015-07-08 NOTE — Anesthesia Post-Procedure Evaluation (Signed)
Post-Anesthesia Evaluation and Assessment    Patient: [name] BuddyMarcia S Swoveland MRN: [id number]  SSN: [id number]    Date of Birth: 08/25/1970  Age: 45 y.o.  Sex: female       Cardiovascular Function/Vital Signs  Visit Vitals   ??? BP 112/74   ??? Pulse 73   ??? Temp 36.4 ??C (97.6 ??F)   ??? Resp 15   ??? Ht 5\' 6"  (1.676 m)   ??? Wt 91 kg (200 lb 9.9 oz)   ??? SpO2 98%   ??? BMI 32.38 kg/m2       Patient is status post general anesthesia for Procedure(s):  LAPAROSCOPIC LYSIS OF ADHESIONS left ovarian to side wall & sigmoid colon .    Nausea/Vomiting: None    Postoperative hydration reviewed and adequate.    Pain:  Pain Scale 1: Numeric (0 - 10) (07/08/15 1330)  Pain Intensity 1: 7 (07/08/15 1330)   Managed    Neurological Status:   Neuro (WDL): Within Defined Limits (07/08/15 1248)   At baseline    Mental Status and Level of Consciousness: Arousable    Pulmonary Status:   O2 Device: Room air (07/08/15 1313)   Adequate oxygenation and airway patent    Complications related to anesthesia: None    Post-anesthesia assessment completed. No concerns    Signed By: A. Dan EuropeMorgan Caliber Landess, MD     July 08, 2015

## 2015-07-08 NOTE — H&P (Signed)
Date of Surgery Update:  [name] BuddyMarcia S Siddoway was seen and examined.  History and physical has been reviewed. The patient has been examined. There have been no significant clinical changes since the completion of the originally dated History and Physical.    Signed By: Elvera MariaMichael P Mccoy Testa, MD     July 08, 2015 12:43 PM

## 2015-07-08 NOTE — Brief Op Note (Signed)
Brief Post-Op Note    Patient: [name] MRN: [id number]  SSN: [id number]    Date of Birth: [birth year]  Age: 45 y.o.  Sex: female      MRN: [id number]    Pre-operative Diagnosis: (R10.2); (HQ4696.2(NB8329.1); PELVIC PAIN; OVARIAN CYSTS    Post-operative Diagnosis: Same as pre-operative diagnosis.     Procedure(s) Performed: Procedure(s) with comments:  LAPAROSCOPIC LYSIS OF ADHESIONS left ovarian to side wall & sigmoid colon  - MD REQUEST R KEELING     Surgeon(s): Elvera MariaMichael P Cara Thaxton, MD    Assistant(s):  Candie Echevaria. Keeling, SA    Anesthesia: general anesthesia   Tresa EndoKelly, MD  Marcene CorningGregson, CRNA    Findings: Absent uterus, normal ovaries bilaterally, thin filmy adhesions of the left ovary to the pelvic side wall and a portion of the sigmoid colon to the posterior culdesac  Op Time Start 1214 End 1239  Dictation # [id number]    Complications: None    Estimated Blood Loss:  <10 cc    Tubes and Drains: Straight catheter 250 cc   IV Fluids 1000 cc    Specimens: * No specimens in log *    Implants: * No implants in log *    Elvera MariaMichael P Temia Debroux, MD  July 08, 2015  12:43 PM

## 2015-07-09 MED FILL — XYLOCAINE-MPF 20 MG/ML (2 %) INJECTION SOLUTION: 20 mg/mL (2 %) | INTRAMUSCULAR | Qty: 60

## 2015-07-09 MED FILL — PROPOFOL 10 MG/ML IV EMUL: 10 mg/mL | INTRAVENOUS | Qty: 20

## 2015-07-09 MED FILL — ROCURONIUM 10 MG/ML IV: 10 mg/mL | INTRAVENOUS | Qty: 50

## 2015-07-09 MED FILL — GLYCOPYRROLATE 0.2 MG/ML IJ SOLN: 0.2 mg/mL | INTRAMUSCULAR | Qty: 0.2

## 2015-07-09 MED FILL — BRIDION 100 MG/ML INTRAVENOUS SOLUTION: 100 mg/mL | INTRAVENOUS | Qty: 300

## 2015-07-09 MED FILL — KETAMINE 50 MG/ML (1 ML) INTRAVENOUS SYRINGE: 50 mg/mL (1 mL) | INTRAVENOUS | Qty: 1

## 2015-07-28 ENCOUNTER — Emergency Department: Admit: 2015-07-28 | Payer: Self-pay | Primary: Nurse Practitioner

## 2015-07-28 ENCOUNTER — Inpatient Hospital Stay
Admit: 2015-07-28 | Discharge: 2015-07-29 | Disposition: A | Discharge status: Home / Self Care | Payer: Self-pay | Attending: Emergency Medicine

## 2015-07-28 DIAGNOSIS — R339 Retention of urine, unspecified: Secondary | ICD-10-CM

## 2015-07-28 LAB — D-DIMER, QUANTITATIVE: D-Dimer, Quant: 0.52 ug/ml(FEU) — ABNORMAL HIGH (ref <0.46)

## 2015-07-28 LAB — CBC WITH AUTOMATED DIFF
ABS. BASOPHILS: 0 10*3/uL (ref 0.0–0.06)
ABS. EOSINOPHILS: 0.3 10*3/uL (ref 0.0–0.4)
ABS. LYMPHOCYTES: 1.9 10*3/uL (ref 0.9–3.6)
ABS. MONOCYTES: 0.6 10*3/uL (ref 0.05–1.2)
ABS. NEUTROPHILS: 2.6 10*3/uL (ref 1.8–8.0)
BASOPHILS: 0 % (ref 0–2)
EOSINOPHILS: 5 % (ref 0–5)
HCT: 36.5 % (ref 35.0–45.0)
HGB: 12 g/dL (ref 12.0–16.0)
LYMPHOCYTES: 36 % (ref 21–52)
MCH: 29.3 PG (ref 24.0–34.0)
MCHC: 32.9 g/dL (ref 31.0–37.0)
MCV: 89 FL (ref 74.0–97.0)
MONOCYTES: 11 % — ABNORMAL HIGH (ref 3–10)
MPV: 10.3 FL (ref 9.2–11.8)
NEUTROPHILS: 48 % (ref 40–73)
PLATELET: 292 10*3/uL (ref 135–420)
RBC: 4.1 M/uL — ABNORMAL LOW (ref 4.20–5.30)
RDW: 13.5 % (ref 11.6–14.5)
WBC: 5.3 10*3/uL (ref 4.6–13.2)

## 2015-07-28 LAB — D DIMER: D DIMER: 0.52 ug/ml(FEU) — ABNORMAL HIGH (ref <0.46)

## 2015-07-28 LAB — EKG, 12 LEAD, INITIAL
Atrial Rate: 115 {beats}/min
Calculated P Axis: 45 degrees
Calculated R Axis: -17 degrees
Calculated T Axis: 4 degrees
P-R Interval: 168 ms
Q-T Interval: 308 ms
QRS Duration: 86 ms
QTC Calculation (Bezet): 426 ms
Ventricular Rate: 115 {beats}/min

## 2015-07-28 LAB — URINALYSIS W/ RFLX MICROSCOPIC
Bilirubin: NEGATIVE
Glucose: NEGATIVE mg/dL
Ketone: NEGATIVE mg/dL
Nitrites: POSITIVE — AB
Protein: 30 mg/dL — AB
Specific gravity: 1.015 (ref 1.005–1.030)
Urobilinogen: 0.2 EU/dL (ref 0.2–1.0)
pH (UA): 6.5 (ref 5.0–8.0)

## 2015-07-28 LAB — METABOLIC PANEL, COMPREHENSIVE
A-G Ratio: 0.9 (ref 0.8–1.7)
ALT (SGPT): 57 U/L — ABNORMAL HIGH (ref 13–56)
AST (SGOT): 32 U/L (ref 15–37)
Albumin: 3.5 g/dL (ref 3.4–5.0)
Alk. phosphatase: 91 U/L (ref 45–117)
Anion gap: 4 mmol/L (ref 3.0–18)
BUN/Creatinine ratio: 13 (ref 12–20)
BUN: 8 MG/DL (ref 7.0–18)
Bilirubin, total: 0.2 MG/DL (ref 0.2–1.0)
CO2: 29 mmol/L (ref 21–32)
Calcium: 9.5 MG/DL (ref 8.5–10.1)
Chloride: 106 mmol/L (ref 100–108)
Creatinine: 0.63 MG/DL (ref 0.6–1.3)
GFR est AA: >60 mL/min/{1.73_m2} (ref >60)
GFR est non-AA: >60 mL/min/{1.73_m2} (ref >60)
Globulin: 3.8 g/dL (ref 2.0–4.0)
Glucose: 100 mg/dL — ABNORMAL HIGH (ref 74–99)
Potassium: 4 mmol/L (ref 3.5–5.5)
Protein, total: 7.3 g/dL (ref 6.4–8.2)
Sodium: 139 mmol/L (ref 136–145)

## 2015-07-28 LAB — NT-PRO BNP: NT pro-BNP: 51 PG/ML (ref 0–450)

## 2015-07-28 LAB — POC LACTIC ACID: Lactic Acid (POC): 1 mmol/L (ref 0.4–2.0)

## 2015-07-28 LAB — CARDIAC PANEL,(CK, CKMB & TROPONIN)
CK - MB: 3.1 ng/ml (ref <3.6)
CK-MB Index: 2 % (ref 0.0–4.0)
CK: 157 U/L (ref 26–192)
Troponin-I, QT: <0.02 NG/ML (ref 0.0–0.045)

## 2015-07-28 LAB — MAGNESIUM: Magnesium: 2.2 mg/dL (ref 1.6–2.6)

## 2015-07-28 LAB — URINE MICROSCOPIC ONLY: WBC: 21 /hpf (ref 0–4)

## 2015-07-28 LAB — TSH 3RD GENERATION: TSH: 0.39 u[IU]/mL (ref 0.36–3.74)

## 2015-07-28 MED ORDER — MAGNESIUM CITRATE ORAL SOLN
ORAL | 0 refills | Status: DC
Start: 2015-07-28 — End: 2016-10-23

## 2015-07-28 MED ORDER — IOPAMIDOL 76 % IV SOLN
370 mg iodine /mL (76 %) | Freq: Once | INTRAVENOUS | Status: AC
Start: 2015-07-28 — End: 2015-07-28
  Administered 2015-07-28: 22:00:00 via INTRAVENOUS

## 2015-07-28 MED ORDER — FUROSEMIDE 20 MG TAB
20 mg | ORAL | Status: AC
Start: 2015-07-28 — End: 2015-07-28
  Administered 2015-07-28: 23:00:00 via ORAL

## 2015-07-28 MED ORDER — HYDROMORPHONE 0.5 MG/0.5 ML SYRINGE
0.5 mg/ mL | Freq: Once | INTRAMUSCULAR | Status: AC
Start: 2015-07-28 — End: 2015-07-28
  Administered 2015-07-28: 23:00:00 via INTRAVENOUS

## 2015-07-28 MED ORDER — PROMETHAZINE 25 MG TAB
25 mg | ORAL_TABLET | Freq: Four times a day (QID) | ORAL | 0 refills | Status: DC | PRN
Start: 2015-07-28 — End: 2016-08-02

## 2015-07-28 MED ORDER — HYDROMORPHONE (PF) 1 MG/ML IJ SOLN
1 mg/mL | Freq: Once | INTRAMUSCULAR | Status: DC
Start: 2015-07-28 — End: 2015-07-28

## 2015-07-28 MED ORDER — MORPHINE 4 MG/ML SYRINGE
4 mg/mL | INTRAMUSCULAR | Status: AC
Start: 2015-07-28 — End: 2015-07-28
  Administered 2015-07-28: 19:00:00 via INTRAVENOUS

## 2015-07-28 MED ORDER — OXYCODONE-ACETAMINOPHEN 5 MG-325 MG TAB
5-325 mg | ORAL_TABLET | Freq: Four times a day (QID) | ORAL | 0 refills | Status: DC | PRN
Start: 2015-07-28 — End: 2015-11-01

## 2015-07-28 MED ORDER — SODIUM CHLORIDE 0.9 % IV PIGGY BACK
2 gram | INTRAVENOUS | Status: AC
Start: 2015-07-28 — End: 2015-07-28
  Administered 2015-07-28: 19:00:00 via INTRAVENOUS

## 2015-07-28 MED ORDER — HYDROMORPHONE (PF) 2 MG/ML IJ SOLN
2 mg/mL | INTRAMUSCULAR | Status: DC | PRN
Start: 2015-07-28 — End: 2015-07-28
  Administered 2015-07-28: 20:00:00 via INTRAVENOUS

## 2015-07-28 MED ORDER — MORPHINE 2 MG/ML INJECTION
2 mg/mL | Freq: Once | INTRAMUSCULAR | Status: DC
Start: 2015-07-28 — End: 2015-07-28
  Administered 2015-07-28: 17:00:00 via INTRAVENOUS

## 2015-07-28 MED ORDER — CEPHALEXIN 500 MG CAP
500 mg | ORAL_CAPSULE | Freq: Three times a day (TID) | ORAL | 0 refills | Status: AC
Start: 2015-07-28 — End: 2015-08-07

## 2015-07-28 MED ORDER — MORPHINE 4 MG/ML SYRINGE
4 mg/mL | INTRAMUSCULAR | Status: AC
Start: 2015-07-28 — End: 2015-07-28
  Administered 2015-07-28: 17:00:00 via INTRAVENOUS

## 2015-07-28 MED FILL — HYDROMORPHONE 0.5 MG/0.5 ML SYRINGE: 0.5 mg/ mL | INTRAMUSCULAR | Qty: 0.5

## 2015-07-28 MED FILL — MORPHINE 4 MG/ML SYRINGE: 4 mg/mL | INTRAMUSCULAR | Qty: 1

## 2015-07-28 MED FILL — DILAUDID (PF) 2 MG/ML INJECTION SOLUTION: 2 mg/mL | INTRAMUSCULAR | Qty: 1

## 2015-07-28 MED FILL — ISOVUE-370  76 % INTRAVENOUS SOLUTION: 370 mg iodine /mL (76 %) | INTRAVENOUS | Qty: 100

## 2015-07-28 MED FILL — MORPHINE 2 MG/ML INJECTION: 2 mg/mL | INTRAMUSCULAR | Qty: 2

## 2015-07-28 MED FILL — FUROSEMIDE 20 MG TAB: 20 mg | ORAL | Qty: 1

## 2015-07-28 MED FILL — CEFTRIAXONE 2 GRAM SOLUTION FOR INJECTION: 2 gram | INTRAMUSCULAR | Qty: 2

## 2015-07-28 NOTE — ED Notes (Signed)
Discussed plan of care at this time. Questions encouraged and addressed. Verbalized understanding. Call light within reach.

## 2015-07-28 NOTE — ED Notes (Signed)
Patient resting quietly in room. Denies need for anything at this time. Call light within reach.

## 2015-07-28 NOTE — Procedures (Signed)
[facility]  *** FINAL REPORT ***    Name: [name], [name]  MRN: [id number]  DOB: 07 Oct 1970  HIS Order #: [id number]  TRAKnet Visit #: [id number]  Date: 28 Jul 2015    TYPE OF TEST: Peripheral Venous Testing    REASON FOR TEST  Pain in limb, Limb swelling    Right Leg:-  Deep venous thrombosis:           No  Superficial venous thrombosis:    No  Deep venous insufficiency:        Not examined  Superficial venous insufficiency: Not examined    Left Leg:-  Deep venous thrombosis:           No  Superficial venous thrombosis:    No  Deep venous insufficiency:        Not examined  Superficial venous insufficiency: Not examined      INTERPRETATION/FINDINGS  Duplex images were obtained using 2-D gray scale, color flow, and  spectral Doppler analysis.  Right leg:  1. No evidence of deep venous thrombosis detected in the veins  visualized.  2. Deep veins visualized include the common femoral, femoral,  popliteal, posterior tibial and peroneal veins.  3. No evidence of superficial thrombosis detected.  4. Superficial veins visualized include the proximal great saphenous  vein.  Left leg:  1. No evidence of deep venous thrombosis detected in the veins  visualized.  2. Deep veins visualized include the common femoral, femoral,  popliteal, posterior tibial and peroneal veins.  3. No evidence of superficial thrombosis detected.  4. Superficial veins visualized include the proximal great saphenous  vein.    ADDITIONAL COMMENTS    I have personally reviewed the data relevant to the interpretation of  this  study.    TECHNOLOGIST: Jennifer McFarland, RVT  Signed: 07/28/2015 02:48 PM    PHYSICIAN: Carianne Taira L.  Salihah Peckham, MD  Signed: 07/29/2015 08:59 AM

## 2015-07-28 NOTE — ED Triage Notes (Signed)
Removal of adhesions from ovaries April 18th, has had urinary retention and urgency right after surgery, was treated for UTI. Was placed on cipro at that time and is still on it. Is c/o of bilateral flank pain. C/o of not being able to empty the bladder.  Bilateral foot swelling and hand swelling.

## 2015-07-28 NOTE — ED Notes (Signed)
I performed a brief evaluation, including history and physical, of the patient here in triage and I have determined that pt will need further treatment and evaluation from the main side ER physician.  I have placed initial orders to help in expediting patients care.     Jul 28, 2015 at 12:02 PM - Garlon HatchetJohn W Anterio Scheel, MD        Visit Vitals   ??? BP 132/85 (BP 1 Location: Left arm, BP Patient Position: At rest)   ??? Pulse (!) 124   ??? Temp 97.9 ??F (36.6 ??C)   ??? Resp 16   ??? SpO2 99%

## 2015-07-28 NOTE — ED Provider Notes (Signed)
HPI Comments: 12:14 PM [name] is a 45 y.o. female with a hx of a partial hysterectomy who presents to the ED c/o low volume micturition since having adhesions removed through Dr. Lenn Sink 07/08/15. She called Dr. Lenn Sink' office the day after surgery and she was told that it was likely a side effect of the anaesthesia; however, her sx have persisted. She states that she can void large volumes by holding her urine for 5-6 hours after feeling the urge to urinate, but she still feels as if there is urine in her bladder. She was seen again at [facility] and they cultured her urine at that time. She was called in Cipro seven days ago, but she feels no different. Four days ago she developed swelling in both legs and shortness of breath. She thought that it may have been due to standing on her feet for long periods of time helping her husband following his ACD surgery. The leg swelling worsened last night despite resting the entire day with her legs elevated. Today she has pain from the knees to the ankles bilaterally. She is continuing to sleep well. She ran out of the Percocet that was prescribed following surgery yesterday. She has been treating her pain with 800 mg of Ibuprofen and 975 mg of Tylenol Q6H with relief of the pain. She is continuing to have soft BMs. She denies chills, dysuria, hematuria, vaginal discharge, diarrhea, hx of thyroid disease, chest pain, and any further complaints.    The history is provided by the patient.        Past Medical History:   Diagnosis Date   ??? Adult ADHD    ??? Anxiety    ??? Chronic back pain    ??? Hypertension    ??? Musculoskeletal disorder    ??? Ovarian cyst    ??? Pelvic pain        Past Surgical History:   Procedure Laterality Date   ??? HX GYN      hysterectomy   ??? HX GYN  11/05/2011    VAGINAL LACERATION REPAIR   ??? HX TUBAL LIGATION           Family History:   Problem Relation Age of Onset   ??? Hypertension Mother    ??? Heart Disease Mother    ??? Cancer Father       died of colon cancer at 6       Social History     Social History   ??? Marital status: MARRIED     Spouse name: N/A   ??? Number of children: N/A   ??? Years of education: N/A     Occupational History   ??? Not on file.     Social History Main Topics   ??? Smoking status: Never Smoker   ??? Smokeless tobacco: Never Used   ??? Alcohol use No   ??? Drug use: No   ??? Sexual activity: Yes     Partners: Male     Other Topics Concern   ??? Not on file     Social History Narrative    ** Merged History Encounter **              ALLERGIES: Codeine; Codeine; Erythromycin; Erythromycin; and Tramadol    Review of Systems   Constitutional: Negative for chills and fever.   HENT: Negative for congestion and sore throat.    Respiratory: Positive for shortness of breath. Negative for cough.    Cardiovascular: Positive for leg  swelling. Negative for chest pain.   Gastrointestinal: Negative for abdominal pain and nausea.   Genitourinary: Positive for difficulty urinating. Negative for dysuria, hematuria and vaginal discharge.   Musculoskeletal: Positive for myalgias (bilateral lower leg pain from the knees down to the ankles). Negative for back pain.   Skin: Negative for rash and wound.   Neurological: Negative for syncope, light-headedness and headaches.   Psychiatric/Behavioral: Negative for behavioral problems. The patient is not nervous/anxious.        Vitals:    07/28/15 1157 07/28/15 1515 07/28/15 1530 07/28/15 1545   BP: 132/85 141/87 140/83 129/69   Pulse: (!) 124 (!) 106 (!) 104 (!) 101   Resp: '16 17 19 18   ' Temp: 97.9 ??F (36.6 ??C)      SpO2: 99% 98% 99% 97%            Physical Exam   Constitutional: She is oriented to person, place, and time. She appears well-developed and well-nourished. No distress.   Overweight    HENT:   Head: Normocephalic and atraumatic.   Right Ear: External ear normal.   Left Ear: External ear normal.   Nose: Nose normal.   Mouth/Throat: Oropharynx is clear and moist.    Eyes: Conjunctivae and EOM are normal. Pupils are equal, round, and reactive to light. No scleral icterus.   Neck: Normal range of motion. Neck supple. No JVD present. No tracheal deviation present. No thyromegaly present.   Cardiovascular: Regular rhythm, normal heart sounds and intact distal pulses.  Exam reveals no gallop and no friction rub.    No murmur heard.  Tachy    Pulmonary/Chest: Effort normal and breath sounds normal. She exhibits no tenderness.   Abdominal: Soft. Bowel sounds are normal. She exhibits no distension. There is no tenderness. There is no rebound and no guarding.   Musculoskeletal: She exhibits edema. She exhibits no tenderness.   Pitting edema R>L B   Lymphadenopathy:     She has no cervical adenopathy.   Neurological: She is alert and oriented to person, place, and time. No cranial nerve deficit. Coordination normal.   No sensory loss, Gait normal, Motor 5/5   Skin: Skin is warm and dry.   Psychiatric: She has a normal mood and affect. Her behavior is normal. Judgment and thought content normal.   Nursing note and vitals reviewed.       MDM  Number of Diagnoses or Management Options  Diagnosis management comments: Pt is a 45yo female with a hx of insomnia, anxiety, chronic RLQ pain presents to the ED with complaint of increasing difficulty urinating and LE edema.  Pt has been controlling her pain with percocet and ran out 2 days ago.  She is currently being treated for a UTI on Cipro.  Pt has noted increasing edema and urinary symptoms since the surgery on 07/08/15 by Dr. Lenn Sink.  Pt is at risk for VTE so will follow DVT eval, cardiac labs, renal function, post void bladder scan, CXR, TSH, pain control then reevaluate. Sallye Ober, DO 12:57 PM      ED Course       Procedures    Vitals:  Patient Vitals for the past 12 hrs:   Temp Pulse Resp BP SpO2   07/28/15 1545 - (!) 101 18 129/69 97 %   07/28/15 1530 - (!) 104 19 140/83 99 %   07/28/15 1515 - (!) 106 17 141/87 98 %    07/28/15 1157 97.9 ??F (36.6 ??C) Marland Kitchen)  124 16 132/85 99 %     Pulse ox reviewed and WNL    Medications ordered:   Medications   HYDROmorphone (PF) (DILAUDID) injection 0.5 mg (0.5 mg IntraVENous Given 07/28/15 1611)   furosemide (LASIX) tablet 20 mg (not administered)   HYDROmorphone (DILAUDID) syringe 0.5 mg (not administered)   morphine injection 4 mg (4 mg IntraVENous Given 07/28/15 1249)   cefTRIAXone (ROCEPHIN) 2 g in 0.9% sodium chloride (MBP/ADV) 50 mL MBP (0 g IntraVENous IV Completed 07/28/15 1536)   morphine injection 4 mg (4 mg IntraVENous Given 07/28/15 1506)   iopamidol (ISOVUE-370) 76 % injection 75-100 mL (80 mL IntraVENous Given 07/28/15 1800)         Lab findings:  Recent Results (from the past 12 hour(s))   URINALYSIS W/ RFLX MICROSCOPIC    Collection Time: 07/28/15 12:05 PM   Result Value Ref Range    Color YELLOW      Appearance CLOUDY      Specific gravity 1.015 1.005 - 1.030      pH (UA) 6.5 5.0 - 8.0      Protein 30 (A) NEG mg/dL    Glucose NEGATIVE  NEG mg/dL    Ketone NEGATIVE  NEG mg/dL    Bilirubin NEGATIVE  NEG      Blood MODERATE (A) NEG      Urobilinogen 0.2 0.2 - 1.0 EU/dL    Nitrites POSITIVE (A) NEG      Leukocyte Esterase LARGE (A) NEG     URINE MICROSCOPIC ONLY    Collection Time: 07/28/15 12:05 PM   Result Value Ref Range    WBC 21 to 35 0 - 4 /hpf    Epithelial cells FEW 0 - 5 /lpf    Bacteria 4+ (A) NEG /hpf   EKG, 12 LEAD, INITIAL    Collection Time: 07/28/15 12:29 PM   Result Value Ref Range    Ventricular Rate 115 BPM    Atrial Rate 115 BPM    P-R Interval 168 ms    QRS Duration 86 ms    Q-T Interval 308 ms    QTC Calculation (Bezet) 426 ms    Calculated P Axis 45 degrees    Calculated R Axis -17 degrees    Calculated T Axis 4 degrees    Diagnosis       Sinus tachycardia  Minimal voltage criteria for LVH, may be normal variant  Non-specific ST/T wave changes  Abnormal ECG  No previous ECGs available  Confirmed by Boyd Kerbs, Jun MD, --- ([id number]) on 07/28/2015 2:20:38 PM      CBC WITH AUTOMATED DIFF    Collection Time: 07/28/15 12:45 PM   Result Value Ref Range    WBC 5.3 4.6 - 13.2 K/uL    RBC 4.10 (L) 4.20 - 5.30 M/uL    HGB 12.0 12.0 - 16.0 g/dL    HCT 36.5 35.0 - 45.0 %    MCV 89.0 74.0 - 97.0 FL    MCH 29.3 24.0 - 34.0 PG    MCHC 32.9 31.0 - 37.0 g/dL    RDW 13.5 11.6 - 14.5 %    PLATELET 292 135 - 420 K/uL    MPV 10.3 9.2 - 11.8 FL    NEUTROPHILS 48 40 - 73 %    LYMPHOCYTES 36 21 - 52 %    MONOCYTES 11 (H) 3 - 10 %    EOSINOPHILS 5 0 - 5 %    BASOPHILS 0 0 - 2 %  ABS. NEUTROPHILS 2.6 1.8 - 8.0 K/UL    ABS. LYMPHOCYTES 1.9 0.9 - 3.6 K/UL    ABS. MONOCYTES 0.6 0.05 - 1.2 K/UL    ABS. EOSINOPHILS 0.3 0.0 - 0.4 K/UL    ABS. BASOPHILS 0.0 0.0 - 0.06 K/UL    DF AUTOMATED     METABOLIC PANEL, COMPREHENSIVE    Collection Time: 07/28/15 12:45 PM   Result Value Ref Range    Sodium 139 136 - 145 mmol/L    Potassium 4.0 3.5 - 5.5 mmol/L    Chloride 106 100 - 108 mmol/L    CO2 29 21 - 32 mmol/L    Anion gap 4 3.0 - 18 mmol/L    Glucose 100 (H) 74 - 99 mg/dL    BUN 8 7.0 - 18 MG/DL    Creatinine 0.63 0.6 - 1.3 MG/DL    BUN/Creatinine ratio 13 12 - 20      GFR est AA >60 >60 ml/min/1.84m    GFR est non-AA >60 >60 ml/min/1.757m   Calcium 9.5 8.5 - 10.1 MG/DL    Bilirubin, total 0.2 0.2 - 1.0 MG/DL    ALT (SGPT) 57 (H) 13 - 56 U/L    AST (SGOT) 32 15 - 37 U/L    Alk. phosphatase 91 45 - 117 U/L    Protein, total 7.3 6.4 - 8.2 g/dL    Albumin 3.5 3.4 - 5.0 g/dL    Globulin 3.8 2.0 - 4.0 g/dL    A-G Ratio 0.9 0.8 - 1.7     MAGNESIUM    Collection Time: 07/28/15 12:45 PM   Result Value Ref Range    Magnesium 2.2 1.6 - 2.6 mg/dL   CARDIAC PANEL,(CK, CKMB & TROPONIN)    Collection Time: 07/28/15 12:45 PM   Result Value Ref Range    CK 157 26 - 192 U/L    CK - MB 3.1 <3.6 ng/ml    CK-MB Index 2.0 0.0 - 4.0 %    Troponin-I, Qt. <0.02 0.0 - 0.045 NG/ML   PRO-BNP    Collection Time: 07/28/15 12:45 PM   Result Value Ref Range    NT pro-BNP 51 0 - 450 PG/ML   TSH 3RD GENERATION     Collection Time: 07/28/15 12:45 PM   Result Value Ref Range    TSH 0.39 0.36 - 3.74 uIU/mL   D DIMER    Collection Time: 07/28/15 12:45 PM   Result Value Ref Range    D DIMER 0.52 (H) <0.46 ug/ml(FEU)   POC LACTIC ACID    Collection Time: 07/28/15 12:50 PM   Result Value Ref Range    Lactic Acid (POC) 1.0 0.4 - 2.0 mmol/L       EKG interpretation by ED Physician:  1229 Sinus tachycardia, rate 115 BPM, LVH, no STEMI per Dr. SaOrma Render  X-Ray, CT or other radiology findings or impressions:  CTA CHEST W OR W WO CONT   Final Result   IMPRESSION: No pulmonary embolism, aortic dissection or acute pulmonary disease.  Eventration right hemidiaphragm. Suspect constipation in the abdomen.  Per Dr. NgAlfonse SpruceRadiology   XR CHEST PORT   Final Result   IMPRESSION:  ??  1. Left base streaky densities, atelectasis versus infiltrate.    Per Dr. ChJohny ShockRadiology   DUPLEX LOWER EXT VENOUS BILAT      Right leg:  1. No evidence of deep venous thrombosis detected in the veins  visualized.  2. Deep veins visualized include  the common femoral, femoral,  popliteal, posterior tibial and peroneal veins.  3. No evidence of superficial thrombosis detected.  4. Superficial veins visualized include the proximal great saphenous  vein.  Left leg:  1. No evidence of deep venous thrombosis detected in the veins  visualized.  2. Deep veins visualized include the common femoral, femoral,  popliteal, posterior tibial and peroneal veins.  3. No evidence of superficial thrombosis detected.  4. Superficial veins visualized include the proximal great saphenous  vein.       Progress notes, Consult notes or additional Procedure notes:   3:31 PM The patient had over 400ccs of urine in her bladder after voiding, so there is evidence of urinary retention. Will place a foley. Her labs are otherwise rassuring, DVT study negative and she has had no SOB since arrival to the ED. Pt to be reevaluated before considering CT of chest.     Pt is feeling a little better after decompressing her bladder but remains tachycardic and has some dyspnea.  Will proceed with CTA after discussing this with the patient and family. Sallye Ober, DO 4:53 PM    6:24 PM Patient re-evaluated, CTA showed no evidence of PE. Will start on abx. Discussed follow up tomorrow with [facility] or her PCP. Discussed that constipation may cause urinary retention. Will start her on Magnesium Citrate at home, she is already taking Mira-lax. Also discussed filling her pain medication. I discussed that I could only giver her 48 of her Percocet, she asked for more, but I discussed that she will need to find a PCP to give her more. I will treat her acute pain. Discussed that Percocet will likely make her constipation worse. Pt verbalized understanding and agreement with the plan. Patient discharged with an indwelling foley with a leg bag.      Disposition:  Diagnosis:   1. Acute cystitis without hematuria    2. Constipation, unspecified constipation type    3. Acute urinary retention    4. Edema, unspecified type        Disposition: Dischage    Follow-up Information     Follow up With Details Comments [place], MD In 1 day  [address]  [place] [address]  [phone]      Jarome Lamas, MD Call in 1 day  [place] [address]  [phone]      [facility] EMERGENCY DEPT  As needed, If symptoms worsen [place] [address]  [phone]           Patient's Medications   Start Taking    CEPHALEXIN (KEFLEX) 500 MG CAPSULE    Take 1 Cap by mouth three (3) times daily for 10 days.    MAGNESIUM CITRATE SOLUTION    Take 1/3 of bottle every 8 hours until finished or until you have a bowel movement.    PROMETHAZINE (PHENERGAN) 25 MG TABLET    Take 1 Tab by mouth every six (6) hours as needed for Nausea. Caution:  This medication may make you drowsy.  Avoid driving while under the influence of this medication.    Continue Taking    ACETAMINOPHEN (TYLENOL EXTRA STRENGTH) 500 MG TABLET    Take 500 mg by mouth every four (4) hours as needed for Pain. Indications: BACK PAIN    ALPRAZOLAM (XANAX) 1 MG TABLET    Take 1 mg by mouth two (2) times daily as  needed for Anxiety. Indications: ANXIETY    DEXTROAMPHETAMINE-AMPHETAMINE (ADDERALL) 30 MG TABLET    Take 60 mg by mouth three (3) times daily as neededIndications: ATTENTION-DEFICIT HYPERACTIVITY DISORDER.            GABAPENTIN (NEURONTIN) 600 MG TABLET    Take 600 mg by mouth three (3) times daily. Indications: ANXIETY    IBUPROFEN (MOTRIN IB) 200 MG TABLET    Take 800 mg by mouth every eight (8) hours as needed for Pain.    POLYETHYLENE GLYCOL (MIRALAX) 17 GRAM/DOSE POWDER    Take 17 g by mouth daily as needed.    ZOLPIDEM (AMBIEN) 10 MG TABLET    Take 10 mg by mouth nightly as needed for Sleep.   These Medications have changed    Modified Medication Previous Medication    OXYCODONE-ACETAMINOPHEN (PERCOCET) 5-325 MG PER TABLET oxyCODONE-acetaminophen (PERCOCET) 5-325 mg per tablet       Take 1 Tab by mouth every six (6) hours as needed for Pain. Max Daily Amount: 4 Tabs.    Take 1 Tab by mouth every six (6) hours as needed for Pain.   Stop Taking    No medications on file         SCRIBE ATTESTATION STATEMENT  Documented by: Candice Camp scribing for, and in the presence of, Everlena Cooper, DO 12:45 PM    Signed by: Candice Camp, Scribe, 07/28/15 12:45 PM    PROVIDER ATTESTATION STATEMENT  I personally performed the services described in the documentation, reviewed the documentation, as recorded by the scribe in my presence, and it accurately and completely records my words and actions.  Everlena Cooper, DO

## 2015-07-28 NOTE — ED Notes (Signed)
I have reviewed discharge instructions with the patient.  The patient verbalized understanding. I have reviewed the provider's instructions with the patient, answering all questions to her satisfaction.

## 2015-07-28 NOTE — Other (Signed)
Vascular study complete. Report to follow.

## 2015-07-29 NOTE — Procedures (Signed)
[facility]  *** FINAL RE[facility] ***    Name: [name], [name]  MRN: [id number]  DOB: 07 Oct 1970  HIS Order #: [id number]  TRAKnet Visit #: [id number]  Date: 28 Jul 2015    TYPE OF TEST: Peripheral Venous Testing    REASON FOR TEST  Pain in limb, Limb swelling    Right Leg:-  Deep venous thrombosis:           No  Superficial venous thrombosis:    No  Deep venous insufficiency:        Not examined  Superficial venous insufficiency: Not examined    Left Leg:-  Deep venous thrombosis:           No  Superficial venous thrombosis:    No  Deep venous insufficiency:        Not examined  Superficial venous insufficiency: Not examined      INTERPRETATION/FINDINGS  Duplex images were obtained using 2-D gray scale, color flow, and  spectral Doppler analysis.  Right leg:  1. No evidence of deep venous thrombosis detected in the veins  visualized.  2. Deep veins visualized include the common femoral, femoral,  popliteal, posterior tibial and peroneal veins.  3. No evidence of superficial thrombosis detected.  4. Superficial veins visualized include the proximal great saphenous  vein.  Left leg:  1. No evidence of deep venous thrombosis detected in the veins  visualized.  2. Deep veins visualized include the common femoral, femoral,  popliteal, posterior tibial and peroneal veins.  3. No evidence of superficial thrombosis detected.  4. Superficial veins visualized include the proximal great saphenous  vein.    ADDITIONAL COMMENTS    I have personally reviewed the data relevant to the interpretation of  this  study.    TECHNOLOGIST: Wynell BalloonJennifer McFarland, RVT  Signed: 07/28/2015 02:48 PM    PHYSICIAN: Barbara CowerJason L.  Debby BudAndre, MD  Signed: 07/29/2015 08:59 AM

## 2015-07-31 ENCOUNTER — Ambulatory Visit
Admit: 2015-07-31 | Discharge: 2015-07-31 | Payer: PRIVATE HEALTH INSURANCE | Attending: Urology | Primary: Nurse Practitioner

## 2015-07-31 DIAGNOSIS — R339 Retention of urine, unspecified: Secondary | ICD-10-CM

## 2015-07-31 MED ORDER — TAMSULOSIN SR 0.4 MG 24 HR CAP
0.4 mg | ORAL_CAPSULE | ORAL | 3 refills | Status: DC
Start: 2015-07-31 — End: 2016-08-02

## 2015-07-31 NOTE — Progress Notes (Signed)
[name]    Chief Complaint   Patient presents with   ??? New Patient   ??? Urinary Retention       History and Physical    This is a 45 year old female Caucasian who last month underwent laparoscopic lysis of adhesions for pain and scarring following a partial hysterectomy.  The patient developed postoperative difficulty with urinating.  Sometimes also would be good but more often than not the flow would be poor or nonexistent.  Ultimately the patient was found to have a urinary tract infection and then when seen in the emergency room had a post void residual of 400 cc.  A Foley catheter was placed and the patient was given culture appropriate antibiotics.  She was also given pain medication similar to what she had postoperatively from the laparoscopy.  She comes now for assessment.  She has never had trouble with her bladder in the past and has never required a catheter in the past    The patient also notes that around this time she was having difficulty with urinating she also had significant lower extremity edema.  Once the catheter was put in, the edema seem to have diminished but is still present to a degree    Past Medical History:   Diagnosis Date   ??? Adult ADHD    ??? Anxiety    ??? Chronic back pain    ??? Hypertension    ??? Musculoskeletal disorder    ??? Ovarian cyst    ??? Pelvic pain      Patient Active Problem List   Diagnosis Code   ??? Dental infection K04.7   ??? Ingrown toenail L60.0   ??? Sprain of lumbar region S33.5XXA   ??? Medication refill Z76.0   ??? Parotiditis K11.20   ??? Otitis externa H60.90   ??? Vaginal bleeding N93.9     Past Surgical History:   Procedure Laterality Date   ??? HX GYN      hysterectomy   ??? HX GYN  11/05/2011    VAGINAL LACERATION REPAIR   ??? HX HYSTERECTOMY     ??? HX TUBAL LIGATION       Current Outpatient Prescriptions   Medication Sig Dispense Refill   ??? tamsulosin (FLOMAX) 0.4 mg capsule One cap q d pc 30 Cap 3   ??? oxyCODONE-acetaminophen (PERCOCET) 5-325 mg per tablet Take 1 Tab by  mouth every six (6) hours as needed for Pain. Max Daily Amount: 4 Tabs. 10 Tab 0   ??? cephALEXin (KEFLEX) 500 mg capsule Take 1 Cap by mouth three (3) times daily for 10 days. 30 Cap 0   ??? ibuprofen (MOTRIN IB) 200 mg tablet Take 800 mg by mouth every eight (8) hours as needed for Pain.     ??? dextroamphetamine-amphetamine (ADDERALL) 30 mg tablet Take 60 mg by mouth three (3) times daily as neededIndications: ATTENTION-DEFICIT HYPERACTIVITY DISORDER.             ??? zolpidem (AMBIEN) 10 mg tablet Take 10 mg by mouth nightly as needed for Sleep.     ??? polyethylene glycol (MIRALAX) 17 gram/dose powder Take 17 g by mouth daily as needed.     ??? gabapentin (NEURONTIN) 600 mg tablet Take 600 mg by mouth three (3) times daily. Indications: ANXIETY     ??? promethazine (PHENERGAN) 25 mg tablet Take 1 Tab by mouth every six (6) hours as needed for Nausea. Caution:  This medication may make you drowsy.  Avoid driving  while under the influence of this medication. 8 Tab 0   ??? magnesium citrate solution Take 1/3 of bottle every 8 hours until finished or until you have a bowel movement. 1 Bottle 0   ??? ALPRAZolam (XANAX) 1 mg tablet Take 1 mg by mouth two (2) times daily as needed for Anxiety. Indications: ANXIETY     ??? acetaminophen (TYLENOL EXTRA STRENGTH) 500 mg tablet Take 500 mg by mouth every four (4) hours as needed for Pain. Indications: BACK PAIN       Allergies   Allergen Reactions   ??? Codeine Unknown (comments)     Pt denies allergy    ??? Codeine Itching   ??? Erythromycin Unknown (comments)   ??? Erythromycin Nausea and Vomiting   ??? Tramadol Other (comments)     PT STATES SHE IS NOT ALLERGIC     Social History     Social History   ??? Marital status: MARRIED     Spouse name: N/A   ??? Number of children: N/A   ??? Years of education: N/A     Occupational History   ??? Not on file.     Social History Main Topics   ??? Smoking status: Never Smoker   ??? Smokeless tobacco: Never Used   ??? Alcohol use No   ??? Drug use: No    ??? Sexual activity: Yes     Partners: Male     Other Topics Concern   ??? Not on file     Social History Narrative    ** Merged History Encounter **           Family History   Problem Relation Age of Onset   ??? Hypertension Mother    ??? Heart Disease Mother    ??? Cancer Father      died of colon cancer at 3245           Visit Vitals   ??? BP (!) 136/92 (BP 1 Location: Left arm, BP Patient Position: Sitting)   ??? Pulse (!) 117   ??? Ht 5\' 6"  (1.676 m)   ??? Wt 214 lb (97.1 kg)   ??? SpO2 97%   ??? BMI 34.54 kg/m2     Physical ??      Gen: WDWN adult NAD  Head?? : normocephalic,?? Normal ROM; eyes without normal pupils, EOMs, no masses;?? conjunctiva normal  Neck: normal movement,?? no evident mass,?? No evident adenopathy, trachea midline,    Abd :bowel sounds normal, no masses, tenderness, organomegaly  Flanks????   GU-    Extremities-   the patient has 2+ pitting edema of ankles and feet and pretibial  Psych- oriented, no evident anxiety, no cognitive impairment evident    Because the patient has not been on Flomax and has been taking the pain medication, the bladder is filled and the catheter is removed and the patient is shown how to do self-catheterization.                      Impression/ PLAN  Postoperative urinary retention possibly due to narcotic consumption    Plan:  Patient will self cath as needed and we will start her on Flomax and she will continue that until she is away from the narcotic pain medication.  She will return as needed.  I have reassured her that the edema that she experienced was not related to her urinary retention  This visit exceeded 30 minutes and >50% was counselling  The patient understands the discussion and plan    PLEASE NOTE:      This document has been produced using voice recognition software.  Unrecognized errors in transcription may be present    Truitt Merle, MD

## 2015-07-31 NOTE — Progress Notes (Signed)
[name] has a reminder for a "due or due soon" health maintenance. I have asked that she contact her primary care provider for follow-up on this health maintenance.

## 2015-07-31 NOTE — Patient Instructions (Addendum)
Urinary Retention: Care Instructions  Your Care Instructions    Urinary retention means that you aren't able to urinate. In men, it is often caused by a blockage of the urinary tract from an enlarged prostate gland. In men and women, it can also be caused by an infection or nerve damage. Or it may be a side effect of a medicine.  The doctor may have drained the urine from your bladder. If you still have problems passing urine, you may need to use a catheter at home. This is used to empty your bladder until the problem can be fixed. Your doctor may put a catheter in your bladder before you go home. If so, he or she will tell you when to come back to have the catheter removed.  The doctor has checked you closely. But problems can develop later. If you notice any problems or new symptoms, get medical treatment right away.  Follow-up care is a key part of your treatment and safety. Be sure to make and go to all appointments, and call your doctor if you are having problems. It's also a good idea to know your test results and keep a list of the medicines you take.  How can you care for yourself at home?  ?? Take your medicines exactly as prescribed. Call your doctor if you think you are having a problem with your medicine. You will get more details on the specific medicines your doctor prescribes.  ?? Check with your doctor before you use any over-the-counter medicines. Many cold and allergy medicines, for example, can make this problem worse. Make sure your doctor knows all of the medicines, vitamins, supplements, and herbal remedies you take.  ?? Spread out through the day the amount of fluid you drink. Do not drink a lot at bedtime.  ?? Avoid alcohol and caffeine.  ?? If you have been given a catheter, or if one is already in place, follow the instructions you were given. Always wash your hands before and after you handle the catheter.  When should you call for help?   Call your doctor now or seek immediate medical care if:  ?? You cannot urinate at all, or it is getting harder to urinate.  ?? You have symptoms of a urinary tract infection. These may include:  ?? Pain or burning when you urinate.  ?? A frequent need to urinate without being able to pass much urine.  ?? Pain in the flank, which is just below the rib cage and above the waist on either side of the back.  ?? Blood in your urine.  ?? A fever.  Watch closely for changes in your health, and be sure to contact your doctor if:  ?? You have any problems with your catheter.  ?? You do not get better as expected.  Where can you learn more?  Go to http://www.healthwise.net/[employer].  Enter M244 in the search box to learn more about "Urinary Retention: Care Instructions."  Current as of: November 01, 2014  Content Version: 11.2  ?? 2006-2017 [employer]. Care instructions adapted under license by [employer] (which disclaims liability or warranty for this information). If you have questions about a medical condition or this instruction, always ask your healthcare professional. Healthwise, Inc[employer] disclaims any warranty or liability for your use of this information.

## 2015-08-03 LAB — CULTURE, BLOOD
Culture result:: NO GROWTH
Culture result:: NO GROWTH

## 2015-08-06 ENCOUNTER — Encounter: Attending: Urology | Primary: Nurse Practitioner

## 2015-08-14 ENCOUNTER — Encounter: Attending: Medical | Primary: Nurse Practitioner

## 2015-10-21 ENCOUNTER — Encounter

## 2015-11-01 ENCOUNTER — Inpatient Hospital Stay
Admit: 2015-11-01 | Discharge: 2015-11-01 | Disposition: A | Discharge status: Home / Self Care | Payer: Self-pay | Attending: Emergency Medicine

## 2015-11-01 DIAGNOSIS — G894 Chronic pain syndrome: Secondary | ICD-10-CM

## 2015-11-01 MED ORDER — OXYCODONE-ACETAMINOPHEN 5 MG-325 MG TAB
5-325 mg | ORAL_TABLET | Freq: Four times a day (QID) | ORAL | 0 refills | Status: DC | PRN
Start: 2015-11-01 — End: 2016-08-02

## 2015-11-01 MED ORDER — KETOROLAC TROMETHAMINE 60 MG/2 ML IM
60 mg/2 mL | INTRAMUSCULAR | Status: AC
Start: 2015-11-01 — End: 2015-11-01
  Administered 2015-11-01: 16:00:00 via INTRAMUSCULAR

## 2015-11-01 MED ORDER — TRAMADOL 50 MG TAB
50 mg | ORAL_TABLET | Freq: Three times a day (TID) | ORAL | 0 refills | Status: DC | PRN
Start: 2015-11-01 — End: 2016-08-02

## 2015-11-01 MED FILL — KETOROLAC TROMETHAMINE 60 MG/2 ML IM: 60 mg/2 mL | INTRAMUSCULAR | Qty: 2

## 2015-11-01 NOTE — ED Provider Notes (Signed)
HPI Comments: Patient is a 45 y/o female who presents to the ER c/o pain and requesting a medication refill.  Patient states that she has been seen by her PCP within the past few weeks due to right shoulder and neck pain.  Patient states she was referred to an ortho specialist, and has x rays taken. Patient was then referred to a neurosurgeon who has scheduled her for an MRI in 2 weeks.  Patient was given a prescription for Percocet and Ultram by her PCP for the pain, however states she has run out.  She cannot call her PCP until Monday, and states her pain has been unbearable.  She denied any new complaints.      The history is provided by the patient.        Past Medical History:   Diagnosis Date   ??? Adult ADHD    ??? Anxiety    ??? Chronic back pain    ??? Hypertension    ??? Musculoskeletal disorder    ??? Ovarian cyst    ??? Pelvic pain        Past Surgical History:   Procedure Laterality Date   ??? HX GYN      hysterectomy   ??? HX GYN  11/05/2011    VAGINAL LACERATION REPAIR   ??? HX HYSTERECTOMY     ??? HX TUBAL LIGATION           Family History:   Problem Relation Age of Onset   ??? Hypertension Mother    ??? Heart Disease Mother    ??? Cancer Father      died of colon cancer at 66       Social History     Social History   ??? Marital status: MARRIED     Spouse name: N/A   ??? Number of children: N/A   ??? Years of education: N/A     Occupational History   ??? Not on file.     Social History Main Topics   ??? Smoking status: Never Smoker   ??? Smokeless tobacco: Never Used   ??? Alcohol use No   ??? Drug use: No   ??? Sexual activity: Yes     Partners: Male     Other Topics Concern   ??? Not on file     Social History Narrative    ** Merged History Encounter **              ALLERGIES: Codeine; Codeine; Erythromycin; Erythromycin; and Tramadol    Review of Systems   Constitutional: Negative for chills, fatigue and fever.   HENT: Negative.  Negative for sore throat.    Eyes: Negative.    Respiratory: Negative for cough and shortness of breath.     Cardiovascular: Negative for chest pain and palpitations.   Gastrointestinal: Negative for abdominal pain, nausea and vomiting.   Genitourinary: Negative for dysuria.   Musculoskeletal: Positive for arthralgias.        Right shoulder/arm pain with numbness and tingling   Skin: Negative.    Neurological: Negative for dizziness, weakness, light-headedness and headaches.   Psychiatric/Behavioral: Negative.    All other systems reviewed and are negative.      Vitals:    11/01/15 1129   BP: (!) 144/102   Pulse: (!) 120   Resp: 20   Temp: 97.4 ??F (36.3 ??C)   SpO2: 100%   Weight: 90.7 kg (200 lb)   Height: 5\' 6"  (1.676 m)  Physical Exam   Constitutional: She is oriented to person, place, and time. She appears well-developed and well-nourished. No distress.   HENT:   Head: Normocephalic and atraumatic.   Mouth/Throat: Oropharynx is clear and moist.   Eyes: Conjunctivae are normal. No scleral icterus.   Neck: Normal range of motion. Neck supple. No JVD present. No tracheal deviation present.   Cardiovascular: Regular rhythm and normal heart sounds.  Tachycardia present.    Pulmonary/Chest: Effort normal and breath sounds normal. No respiratory distress. She has no wheezes.   Abdominal: Soft. There is no tenderness.   Musculoskeletal:        Right shoulder: She exhibits decreased range of motion.   Pt reports increased pain with ROM; DROM; normal distal pulses BL.  No crepitus on exam; strong and equal grip strength   Neurological: She is alert and oriented to person, place, and time. She has normal strength. Gait normal. GCS eye subscore is 4. GCS verbal subscore is 5. GCS motor subscore is 6.   Skin: Skin is warm and dry. She is not diaphoretic.   Psychiatric: She has a normal mood and affect.   Nursing note and vitals reviewed.       MDM  Number of Diagnoses or Management Options  Diagnosis management comments: 11:46 AM  45 y/o female c/o running out of pain medications.  Already has ortho and  neurosurgery follow up.  No indication for further imaging at this time.  Discussed with pt our pain medication policy.  Reviewed Pts PMP, and consistent with history.  Will plan on small prescription and have f/u with PCP no Monday.  All questions answered and patient in agreement with plan of care.  Will plan for discharge.  Dwan Bolt, PA-C    Clinical Impression:  Chronic pain, med refill, elevated blood pressure reading    One or more blood pressure readings were noted elevated during the Pt's presentation in the emergency department this date.  This abnormal reading has been cited in the Pt's diagnosis, and they have been encouraged to follow up with their primary care physician, or referred to a consultant for further evaluation and treatment.       Risk of Complications, Morbidity, and/or Mortality  Presenting problems: low  Diagnostic procedures: low  Management options: low    Patient Progress  Patient progress: stable    ED Course       Procedures           Vitals:  Patient Vitals for the past 12 hrs:   Temp Pulse Resp BP SpO2   11/01/15 1129 97.4 ??F (36.3 ??C) (!) 120 20 (!) 144/102 100 %         Medications ordered:   Medications   ketorolac tromethamine (TORADOL) 60 mg/2 mL injection 60 mg (not administered)         Lab findings:  No results found for this or any previous visit (from the past 12 hour(s)).    EKG interpretation by ED Physician:      X-Ray, CT or other radiology findings or impressions:  No orders to display       Progress notes, Consult notes or additional Procedure notes:       Reevaluation of patient:       Disposition:  Diagnosis:   1. Chronic pain syndrome    2. Medication refill    3. Elevated blood pressure reading        Disposition: Discharged    Follow-up  Information     Follow up With Details Comments Contact Info    [facility]  If symptoms worsen [address]  [place]  [id number]  [phone]    Oliver PilaLopito B Bugarin, MD Call in 2 days  [address]   [place] [id number]  [phone]             Patient's Medications   Start Taking    TRAMADOL (ULTRAM) 50 MG TABLET    Take 1 Tab by mouth every eight (8) hours as needed for Pain. Max Daily Amount: 150 mg.   Continue Taking    ACETAMINOPHEN (TYLENOL EXTRA STRENGTH) 500 MG TABLET    Take 500 mg by mouth every four (4) hours as needed for Pain. Indications: BACK PAIN    ALPRAZOLAM (XANAX) 1 MG TABLET    Take 1 mg by mouth two (2) times daily as needed for Anxiety. Indications: ANXIETY    DEXTROAMPHETAMINE-AMPHETAMINE (ADDERALL) 30 MG TABLET    Take 60 mg by mouth three (3) times daily as neededIndications: ATTENTION-DEFICIT HYPERACTIVITY DISORDER.            GABAPENTIN (NEURONTIN) 600 MG TABLET    Take 600 mg by mouth three (3) times daily. Indications: ANXIETY    IBUPROFEN (MOTRIN IB) 200 MG TABLET    Take 800 mg by mouth every eight (8) hours as needed for Pain.    MAGNESIUM CITRATE SOLUTION    Take 1/3 of bottle every 8 hours until finished or until you have a bowel movement.    POLYETHYLENE GLYCOL (MIRALAX) 17 GRAM/DOSE POWDER    Take 17 g by mouth daily as needed.    PROMETHAZINE (PHENERGAN) 25 MG TABLET    Take 1 Tab by mouth every six (6) hours as needed for Nausea. Caution:  This medication may make you drowsy.  Avoid driving while under the influence of this medication.    TAMSULOSIN (FLOMAX) 0.4 MG CAPSULE    One cap q d pc    ZOLPIDEM (AMBIEN) 10 MG TABLET    Take 10 mg by mouth nightly as needed for Sleep.   These Medications have changed    Modified Medication Previous Medication    OXYCODONE-ACETAMINOPHEN (PERCOCET) 5-325 MG PER TABLET oxyCODONE-acetaminophen (PERCOCET) 5-325 mg per tablet       Take 1 Tab by mouth every six (6) hours as needed for Pain. Max Daily Amount: 4 Tabs.    Take 1 Tab by mouth every six (6) hours as needed for Pain. Max Daily Amount: 4 Tabs.   Stop Taking    No medications on file

## 2015-11-01 NOTE — ED Notes (Signed)
I have reviewed discharge instructions with the patient.  The patient verbalized understanding. Paper discharge instructions signed and placed in scan bin.

## 2015-11-06 ENCOUNTER — Inpatient Hospital Stay: Payer: MEDICAID | Attending: Orthopaedic Surgery | Primary: Nurse Practitioner

## 2015-11-06 ENCOUNTER — Ambulatory Visit: Payer: MEDICAID | Primary: Nurse Practitioner

## 2016-08-02 ENCOUNTER — Inpatient Hospital Stay
Admit: 2016-08-02 | Discharge: 2016-08-02 | Disposition: A | Discharge status: Home / Self Care | Payer: MEDICAID | Attending: Emergency Medical Services

## 2016-08-02 DIAGNOSIS — M25512 Pain in left shoulder: Secondary | ICD-10-CM

## 2016-08-02 MED ORDER — LIDOCAINE 5 % (700 MG/PATCH) ADHESIVE PATCH
5 % | CUTANEOUS | 0 refills | Status: DC
Start: 2016-08-02 — End: 2016-10-23

## 2016-08-02 MED ORDER — PREDNISONE 20 MG TAB
20 mg | ORAL_TABLET | Freq: Every day | ORAL | 0 refills | Status: DC
Start: 2016-08-02 — End: 2016-10-23

## 2016-08-02 MED ORDER — PREDNISONE 20 MG TAB
20 mg | ORAL | Status: AC
Start: 2016-08-02 — End: 2016-08-02
  Administered 2016-08-02: 20:00:00 via ORAL

## 2016-08-02 MED ORDER — METHOCARBAMOL 500 MG TAB
500 mg | ORAL | Status: AC
Start: 2016-08-02 — End: 2016-08-02
  Administered 2016-08-02: 20:00:00 via ORAL

## 2016-08-02 MED ORDER — METHOCARBAMOL 500 MG TAB
500 mg | ORAL_TABLET | Freq: Four times a day (QID) | ORAL | 0 refills | Status: DC
Start: 2016-08-02 — End: 2016-10-23

## 2016-08-02 MED FILL — METHOCARBAMOL 500 MG TAB: 500 mg | ORAL | Qty: 2

## 2016-08-02 MED FILL — PREDNISONE 20 MG TAB: 20 mg | ORAL | Qty: 3

## 2016-08-02 NOTE — ED Provider Notes (Signed)
[facility] Care  Emergency Department Treatment Report        Patient: KIYONA MCNALL Age: 46 y.o. Sex: female    Date of Birth: 1971-01-23 Admit Date: 08/02/2016 PCP: None   MRN: [id number]  CSN: [id number]  Attending: Elsie Saas, MD   Room: H02/H02 Time Dictated: 3:32 PM [id number]       Chief Complaint   Left arm pain    History of Present Illness   46 y.o. female complaining of pain left arm which began April 26.  She states the pain starts in the shoulder and radiates down to the elbow, not associated with any trauma, constant and rates it as a 7 on a scale 1-10.  Patient denies any relief of the pain with  tramadol Motrin or lidocaine patches.  She states the pain is worse with movement.  Patient was [facility] at [facility]. and had x-rays of the shoulder and referred to orthopedist.  Patient states she called orthopedist in [place] and they cannot see her for another month and that's when she came today.    Review of Systems   Review of Systems   Constitutional: Negative for diaphoresis and fever.   Cardiovascular: Negative for chest pain.   Musculoskeletal:        Pain left arm   Neurological:        Tingling all the fingers left hand       Past Medical/Surgical History     Past Medical History:   Diagnosis Date   ??? Adult ADHD    ??? Anxiety    ??? Chronic back pain    ??? Hypertension    ??? Musculoskeletal disorder    ??? Ovarian cyst    ??? Pelvic pain      Past Surgical History:   Procedure Laterality Date   ??? HX GYN      hysterectomy   ??? HX GYN  11/05/2011    VAGINAL LACERATION REPAIR   ??? HX HYSTERECTOMY     ??? HX TUBAL LIGATION         Social History     Social History     Social History   ??? Marital status: MARRIED     Spouse name: N/A   ??? Number of children: N/A   ??? Years of education: N/A     Occupational History   ??? Not on file.     Social History Main Topics   ??? Smoking status: Never Smoker   ??? Smokeless tobacco: Never Used   ??? Alcohol use No   ??? Drug use: No   ??? Sexual activity: Yes      Partners: Male     Other Topics Concern   ??? Not on file     Social History Narrative    ** Merged History Encounter **            Family History     Family History   Problem Relation Age of Onset   ??? Hypertension Mother    ??? Heart Disease Mother    ??? Cancer Father      died of colon cancer at 67       Current Medications     Prior to Admission Medications   Prescriptions Last Dose Informant Patient Reported? Taking?   ALPRAZolam (XANAX) 1 mg tablet   Yes No   Sig: Take 1 mg by mouth two (2) times daily as needed for Anxiety. Indications: ANXIETY  acetaminophen (TYLENOL EXTRA STRENGTH) 500 mg tablet   Yes No   Sig: Take 500 mg by mouth every four (4) hours as needed for Pain. Indications: BACK PAIN   dextroamphetamine-amphetamine (ADDERALL) 30 mg tablet   Yes No   Sig: Take 60 mg by mouth three (3) times daily as neededIndications: ATTENTION-DEFICIT HYPERACTIVITY DISORDER.           gabapentin (NEURONTIN) 600 mg tablet   Yes No   Sig: Take 600 mg by mouth three (3) times daily. Indications: ANXIETY   ibuprofen (MOTRIN IB) 200 mg tablet   Yes No   Sig: Take 800 mg by mouth every eight (8) hours as needed for Pain.   magnesium citrate solution   No No   Sig: Take 1/3 of bottle every 8 hours until finished or until you have a bowel movement.   oxyCODONE-acetaminophen (PERCOCET) 5-325 mg per tablet   No No   Sig: Take 1 Tab by mouth every six (6) hours as needed for Pain. Max Daily Amount: 4 Tabs.   polyethylene glycol (MIRALAX) 17 gram/dose powder   Yes No   Sig: Take 17 g by mouth daily as needed.   promethazine (PHENERGAN) 25 mg tablet   No No   Sig: Take 1 Tab by mouth every six (6) hours as needed for Nausea. Caution:  This medication may make you drowsy.  Avoid driving while under the influence of this medication.   tamsulosin (FLOMAX) 0.4 mg capsule   No No   Sig: One cap q d pc   traMADol (ULTRAM) 50 mg tablet   No No   Sig: Take 1 Tab by mouth every eight (8) hours as needed for Pain. Max  Daily Amount: 150 mg.   zolpidem (AMBIEN) 10 mg tablet   Yes No   Sig: Take 10 mg by mouth nightly as needed for Sleep.      Facility-Administered Medications: None       Allergies     Allergies   Allergen Reactions   ??? Codeine Unknown (comments)     Pt denies allergy    ??? Codeine Itching   ??? Erythromycin Unknown (comments)   ??? Erythromycin Nausea and Vomiting   ??? Tramadol Other (comments)     PT STATES SHE IS NOT ALLERGIC       Physical Exam   ED Triage Vitals   ED Encounter Vitals Group      BP 08/02/16 1356 143/101      Pulse (Heart Rate) 08/02/16 1356 100      Resp Rate 08/02/16 1356 16      Temp 08/02/16 1356 98.1 ??F (36.7 ??C)      Temp src --       O2 Sat (%) 08/02/16 1356 97 %      Weight 08/02/16 1356 200 lb      Height 08/02/16 1356 5\' 6"      Physical Exam   Constitutional: She is oriented to person, place, and time.   Constitutional:General appearance:  Patient appears well developed and well nourished.     HENT:   Head: Normocephalic and atraumatic.   Mouth/Throat: Oropharynx is clear and moist.   Eyes: Conjunctivae are normal.   Neck: Neck supple.   No midline cervical tenderness, and a left cervical paraspinal muscles ,no edema or erythema   Pulmonary/Chest: Effort normal. No respiratory distress.   Musculoskeletal:   Left arm-no edema or erythema, no bony tenderness, pain with abduction of left arm  greater than 80??, pain with adduction of the arm especially with resistance and with abduction with resistance, tenderness noted left periscapular region, radial pulses intact, light touch sensation intact of digits   Neurological: She is alert and oriented to person, place, and time.   DTRs intact bilateral upper extremities   Vitals reviewed.       Impression and Management Plan   This is a patient whose had shoulder pain for a few weeks with no acute trauma and prior imaging studies who is here because she cannot get in with orthopedist as well as for pain, no need for repeat imaging study   ED Course      Medications   methocarbamol (ROBAXIN) tablet 1,000 mg (not administered)   predniSONE (DELTASONE) tablet 60 mg (not administered)   Records reviewed and patient had x-rays of the shoulder April 28 which were negative     PMP reviewed and patient is had multiple prescriptions including a prescription for Suboxone May 9 as well as prescribed gabapentin, she has 7 different prescribers and 57 prescriptions on the PMP .  Medical Decision Making   We will prescribe some prednisone and she was given the name for thorough for follow-up.  She's told to follow-up for her elevated blood pressure    Final Diagnosis       ICD-10-CM ICD-9-CM   1. Acute pain of left shoulder M25.512 719.41   2. Hypertension, unspecified type I10 401.9       Disposition       Current Discharge Medication List      START taking these medications    Details   methocarbamol (ROBAXIN) 500 mg tablet Take 2 Tabs by mouth four (4) times daily.  Qty: 24 Tab, Refills: 0      predniSONE (DELTASONE) 20 mg tablet Take 2 Tabs by mouth daily (with breakfast).  Qty: 10 Tab, Refills: 0      lidocaine (LIDODERM) 5 % Apply patch to the affected area for 12 hours a day and remove for 12 hours a day.  Qty: 10 Each, Refills: 0             The patient was personally evaluated by myself and Dr. Elsie SaasAVID A PITROLO, MD who agrees with the above assessment and plan.    Farrel Demarkiana Cashlyn Huguley, PA-C  Aug 02, 2016    My signature above authenticates this document and my orders, the final ??  diagnosis (es), discharge prescription (s), and instructions in the Epic ??  record.  If you have any questions please contact [phone].  ??  Nursing notes have been reviewed by the physician/ advanced practice ??  Clinician.    Dragon medical dictation software was used for portions of this report. Unintended voice recognition errors may occur.

## 2016-08-02 NOTE — ED Notes (Signed)
4:26 PM  08/02/16     Discharge instructions given to patient (name) with verbalization of understanding. Patient accompanied by husband.  Patient discharged with the following prescriptions robaxin, lidocaine, prednisone. Patient discharged to home (destination).      Elizbeth SquiresMelinda Tysor, RN

## 2016-08-02 NOTE — ED Triage Notes (Signed)
Patient has had left sided arm pain for past 2 weeks or so, [facility] but appointment is not till June. Patient c/o worsening pain to left arm. Patient denies initial injury.

## 2016-08-12 ENCOUNTER — Inpatient Hospital Stay
Admit: 2016-08-12 | Discharge: 2016-08-13 | Disposition: A | Discharge status: Home / Self Care | Payer: Self-pay | Attending: Emergency Medicine

## 2016-08-12 DIAGNOSIS — S90821A Blister (nonthermal), right foot, initial encounter: Secondary | ICD-10-CM

## 2016-08-12 NOTE — ED Triage Notes (Signed)
Patient arrives by EMS to triage. C/o right foot pain and blister to plantar aspect of foot. States she has been walking all day today with sandals.

## 2016-08-12 NOTE — ED Notes (Signed)
I have reviewed discharge instructions with the patient.  The patient verbalized understanding. Discharge medications reviewed with patient and appropriate educational materials and side effects teaching were provided.

## 2016-08-12 NOTE — ED Provider Notes (Signed)
EMERGENCY DEPARTMENT HISTORY AND PHYSICAL EXAM    8:31 PM      Date: 08/12/2016  Patient Name: [name]    History of Presenting Illness     Chief Complaint   Patient presents with   ??? Foot Pain         History Provided By: Patient    Chief Complaint: blisters to feet  Duration:  Hours  Timing:  Acute  Location: bilateral feet  Quality: Aching and burning, numbness  Severity: Severe  Modifying Factors: no improvement with tylenol and motrin  Associated Symptoms: denies any other associated signs or symptoms      Additional History (Context):[name] is a 46 y.o. female with a pertinent history of chronic pain, HTN, anxiety who presents via ambulance to the emergency department for evaluation of painful blisters to right foot from walking around today in new sandals.  She states she has blisters to both feet, but it is worse on the right foot.  Pt reports she took tylenol and motrin at home prior to calling 911.  She relates pain is accompanied by numbness.  She denies any falls, trauma, or other injury to feet.  Pt denies any fevers or chills, headache, dizziness or light headedness, ENT issues, CP or discomfort, SOB, cough, n/v/d/c, abd pain, back pain, diaphoresis, melena/hematochezia, dysuria, hematuria, frequency, focal weakness/numbness/tingling, or rash.  Patient has no other complaints at this time.    PCP:  None      Current Outpatient Prescriptions   Medication Sig Dispense Refill   ??? ibuprofen (MOTRIN) 800 mg tablet Take 1 Tab by mouth every six (6) hours as needed for Pain for up to 7 days. 20 Tab 0   ??? methocarbamol (ROBAXIN) 500 mg tablet Take 2 Tabs by mouth four (4) times daily. 24 Tab 0   ??? predniSONE (DELTASONE) 20 mg tablet Take 2 Tabs by mouth daily (with breakfast). 10 Tab 0   ??? lidocaine (LIDODERM) 5 % Apply patch to the affected area for 12 hours a day and remove for 12 hours a day. 10 Each 0   ??? magnesium citrate solution Take 1/3 of bottle every 8 hours until  finished or until you have a bowel movement. 1 Bottle 0   ??? ibuprofen (MOTRIN IB) 200 mg tablet Take 800 mg by mouth every eight (8) hours as needed for Pain.     ??? dextroamphetamine-amphetamine (ADDERALL) 30 mg tablet Take 60 mg by mouth three (3) times daily as neededIndications: ATTENTION-DEFICIT HYPERACTIVITY DISORDER.             ??? ALPRAZolam (XANAX) 1 mg tablet Take 1 mg by mouth two (2) times daily as needed for Anxiety. Indications: ANXIETY     ??? zolpidem (AMBIEN) 10 mg tablet Take 10 mg by mouth nightly as needed for Sleep.     ??? polyethylene glycol (MIRALAX) 17 gram/dose powder Take 17 g by mouth daily as needed.     ??? gabapentin (NEURONTIN) 600 mg tablet Take 600 mg by mouth three (3) times daily. Indications: ANXIETY     ??? acetaminophen (TYLENOL EXTRA STRENGTH) 500 mg tablet Take 500 mg by mouth every four (4) hours as needed for Pain. Indications: BACK PAIN         Past History     Past Medical History:  Past Medical History:   Diagnosis Date   ??? Adult ADHD    ??? Anxiety    ??? Chronic back pain    ???  Hypertension    ??? Musculoskeletal disorder    ??? Ovarian cyst    ??? Pelvic pain        Past Surgical History:  Past Surgical History:   Procedure Laterality Date   ??? HX GYN      hysterectomy   ??? HX GYN  11/05/2011    VAGINAL LACERATION REPAIR   ??? HX HYSTERECTOMY     ??? HX TUBAL LIGATION         Family History:  Family History   Problem Relation Age of Onset   ??? Hypertension Mother    ??? Heart Disease Mother    ??? Cancer Father      died of colon cancer at 34       Social History:  Social History   Substance Use Topics   ??? Smoking status: Never Smoker   ??? Smokeless tobacco: Never Used   ??? Alcohol use No       Allergies:  Allergies   Allergen Reactions   ??? Codeine Unknown (comments)     Pt denies allergy    ??? Codeine Itching   ??? Erythromycin Unknown (comments)   ??? Erythromycin Nausea and Vomiting         Review of Systems       Review of Systems   Constitutional: Negative for chills and fever.    HENT: Negative for congestion, rhinorrhea and sore throat.    Respiratory: Negative for cough and shortness of breath.    Cardiovascular: Negative for chest pain.   Gastrointestinal: Negative for abdominal pain, blood in stool, constipation, diarrhea, nausea and vomiting.   Genitourinary: Negative for dysuria, frequency and hematuria.   Musculoskeletal: Negative for back pain and myalgias.   Skin: Positive for wound. Negative for rash.   Neurological: Positive for numbness. Negative for dizziness and headaches.         Physical Exam     Visit Vitals   ??? BP (!) 154/125 (BP 1 Location: Right arm, BP Patient Position: Sitting)   ??? Pulse (!) 103   ??? Temp 97.6 ??F (36.4 ??C)   ??? Resp 14   ??? Ht 5\' 6"  (1.676 m)   ??? SpO2 97%         Physical Exam   Constitutional: She is oriented to person, place, and time. She appears well-developed and well-nourished. She appears distressed.   Pt rocking back and forth, moaning, histrionic on exam   HENT:   Head: Normocephalic and atraumatic.   Eyes: Conjunctivae are normal.   Neck: Normal range of motion. Neck supple.   Cardiovascular: Regular rhythm and normal heart sounds.    Pulmonary/Chest: Effort normal. No respiratory distress.   Musculoskeletal: She exhibits tenderness. She exhibits no edema or deformity.   Scattered vesicles to right plantar forefoot.  No bleeding.  Intact distal sensation and cap refill < 2 sec.     Vesicles noted to left plantar forefoot to a lesser degree than the right.    Neurological: She is alert and oriented to person, place, and time. She has normal reflexes.   Skin: Skin is warm and dry. She is not diaphoretic.   Nursing note and vitals reviewed.        Diagnostic Study Results     Labs -  No results found for this or any previous visit (from the past 12 hour(s)).    Radiologic Studies -   No results found.      Medical Decision Making  I am the first provider for this patient.    I reviewed the vital signs, available nursing notes, past medical history,  past surgical history, family history and social history.    Vital Signs-Reviewed the patient's vital signs.    Pulse Oximetry Analysis -  97% on room air (Interpretation)    Records Reviewed: Nursing Notes (Time of Review: 8:31 PM)    ED Course: Progress Notes, Reevaluation, and Consults:    Provider Notes (Medical Decision Making):   Differential Diagnosis:  Blister, laceration, avulsion, abrasion, puncture, skin tear, contusion    Plan:  Pt presents with histrionics, elevated blood pressure.  Exam reveals uncomplicated minor vesicles to bilateral feet, R>L, consistent with friction blisters.  Treated here with       Diagnosis     Clinical Impression:   1. Friction blisters of sole of right foot, initial encounter    2. Elevated blood pressure reading        Disposition: DC Home    Follow-up Information     Follow up With Details Comments Contact Info    [facility] Call in 2 days For follow-up [address]  [place] [address]  [phone]    [facility] EMERGENCY DEPT Go to As needed, If symptoms worsen [address]  [place] [address]  [phone]           Patient's Medications   Start Taking    IBUPROFEN (MOTRIN) 800 MG TABLET    Take 1 Tab by mouth every six (6) hours as needed for Pain for up to 7 days.   Continue Taking    ACETAMINOPHEN (TYLENOL EXTRA STRENGTH) 500 MG TABLET    Take 500 mg by mouth every four (4) hours as needed for Pain. Indications: BACK PAIN    ALPRAZOLAM (XANAX) 1 MG TABLET    Take 1 mg by mouth two (2) times daily as needed for Anxiety. Indications: ANXIETY    DEXTROAMPHETAMINE-AMPHETAMINE (ADDERALL) 30 MG TABLET    Take 60 mg by mouth three (3) times daily as neededIndications: ATTENTION-DEFICIT HYPERACTIVITY DISORDER.            GABAPENTIN (NEURONTIN) 600 MG TABLET    Take 600 mg by mouth three (3) times daily. Indications: ANXIETY    IBUPROFEN (MOTRIN IB) 200 MG TABLET    Take 800 mg by mouth every eight (8) hours as needed for Pain.     LIDOCAINE (LIDODERM) 5 %    Apply patch to the affected area for 12 hours a day and remove for 12 hours a day.    MAGNESIUM CITRATE SOLUTION    Take 1/3 of bottle every 8 hours until finished or until you have a bowel movement.    METHOCARBAMOL (ROBAXIN) 500 MG TABLET    Take 2 Tabs by mouth four (4) times daily.    POLYETHYLENE GLYCOL (MIRALAX) 17 GRAM/DOSE POWDER    Take 17 g by mouth daily as needed.    PREDNISONE (DELTASONE) 20 MG TABLET    Take 2 Tabs by mouth daily (with breakfast).    ZOLPIDEM (AMBIEN) 10 MG TABLET    Take 10 mg by mouth nightly as needed for Sleep.   These Medications have changed    No medications on file   Stop Taking    No medications on file     _______________________________

## 2016-08-13 MED ORDER — HYDROCODONE-ACETAMINOPHEN 5 MG-325 MG TAB
5-325 mg | ORAL | Status: AC
Start: 2016-08-13 — End: 2016-08-12
  Administered 2016-08-13: via ORAL

## 2016-08-13 MED ORDER — IBUPROFEN 800 MG TAB
800 mg | ORAL_TABLET | Freq: Four times a day (QID) | ORAL | 0 refills | Status: AC | PRN
Start: 2016-08-13 — End: 2016-08-19

## 2016-08-13 MED FILL — HYDROCODONE-ACETAMINOPHEN 5 MG-325 MG TAB: 5-325 mg | ORAL | Qty: 1

## 2016-08-30 ENCOUNTER — Emergency Department: Admit: 2016-08-30 | Payer: Self-pay | Primary: Nurse Practitioner

## 2016-08-30 ENCOUNTER — Inpatient Hospital Stay
Admit: 2016-08-30 | Discharge: 2016-08-30 | Disposition: A | Discharge status: Home / Self Care | Payer: Self-pay | Attending: Emergency Medicine

## 2016-08-30 DIAGNOSIS — N83201 Unspecified ovarian cyst, right side: Secondary | ICD-10-CM

## 2016-08-30 LAB — URINALYSIS W/ RFLX MICROSCOPIC
Bilirubin: NEGATIVE
Blood: NEGATIVE
Glucose: NEGATIVE mg/dL
Ketone: NEGATIVE mg/dL
Leukocyte Esterase: NEGATIVE
Nitrites: NEGATIVE
Protein: NEGATIVE mg/dL
Specific gravity: 1.011 (ref 1.005–1.030)
Urobilinogen: 0.2 EU/dL (ref 0.2–1.0)
pH (UA): 7 (ref 5.0–8.0)

## 2016-08-30 LAB — HCG URINE, QL: HCG urine, QL: NEGATIVE

## 2016-08-30 LAB — WET PREP

## 2016-08-30 MED ORDER — ONDANSETRON 8 MG TAB, RAPID DISSOLVE
8 mg | ORAL | Status: AC
Start: 2016-08-30 — End: 2016-08-30
  Administered 2016-08-30: 22:00:00 via ORAL

## 2016-08-30 MED ORDER — IBUPROFEN 800 MG TAB
800 mg | ORAL_TABLET | Freq: Three times a day (TID) | ORAL | 0 refills | Status: AC | PRN
Start: 2016-08-30 — End: 2016-09-06

## 2016-08-30 MED ORDER — OXYCODONE-ACETAMINOPHEN 5 MG-325 MG TAB
5-325 mg | ORAL | Status: AC
Start: 2016-08-30 — End: 2016-08-30
  Administered 2016-08-30: 22:00:00 via ORAL

## 2016-08-30 MED FILL — OXYCODONE-ACETAMINOPHEN 5 MG-325 MG TAB: 5-325 mg | ORAL | Qty: 1

## 2016-08-30 MED FILL — ONDANSETRON 8 MG TAB, RAPID DISSOLVE: 8 mg | ORAL | Qty: 1

## 2016-08-30 NOTE — ED Provider Notes (Signed)
HPI Comments: [name] is a 46 year old female who presents to the ED with a c/o severe right sided pelvic pain.  Pt states she has had multiple GYN surgeries in the past, the last for scar tissue.  The pain began early this morning and has continued since then.  Nothing has made it better or worse.  She has not self medicated this pain.  Adds that she is having urinary difficulty as well.  She feels as if she has to urinate, but only a little bit comes out.  She has had to go home from the ER with a catheter in place and follow up with Urology in the last.      Patient is a 46 y.o. female presenting with abdominal pain. The history is provided by the patient. History limited by: No communication barrier.   Abdominal Pain    This is a new problem. The current episode started 6 to 12 hours ago. The problem occurs constantly. The problem has not changed since onset.Associated with: Pt makes an association with previous pelvic pain.   Pain location: Right sided pelvic pain. The quality of the pain is pressure-like. The pain is moderate.        Past Medical History:   Diagnosis Date   ??? Adult ADHD    ??? Anxiety    ??? Chronic back pain    ??? Hypertension    ??? Musculoskeletal disorder    ??? Ovarian cyst    ??? Pelvic pain        Past Surgical History:   Procedure Laterality Date   ??? HX GYN      hysterectomy   ??? HX GYN  11/05/2011    VAGINAL LACERATION REPAIR   ??? HX HYSTERECTOMY     ??? HX TUBAL LIGATION           Family History:   Problem Relation Age of Onset   ??? Hypertension Mother    ??? Heart Disease Mother    ??? Cancer Father      died of colon cancer at 52       Social History     Social History   ??? Marital status: MARRIED     Spouse name: N/A   ??? Number of children: N/A   ??? Years of education: N/A     Occupational History   ??? Not on file.     Social History Main Topics   ??? Smoking status: Never Smoker   ??? Smokeless tobacco: Never Used   ??? Alcohol use No   ??? Drug use: No   ??? Sexual activity: Yes     Partners: Male      Other Topics Concern   ??? Not on file     Social History Narrative    ** Merged History Encounter **              ALLERGIES: Codeine; Codeine; Erythromycin; and Erythromycin    Review of Systems   Constitutional: Negative.    HENT: Negative.    Eyes: Negative.    Respiratory: Negative.    Cardiovascular: Negative.    Gastrointestinal: Positive for abdominal pain.   Endocrine: Negative.    Genitourinary: Positive for pelvic pain.   Musculoskeletal: Negative.    Skin: Negative.    Allergic/Immunologic: Negative.    Neurological: Negative.    Hematological: Negative.    Psychiatric/Behavioral: Negative.        Vitals:    08/30/16 1601  BP: (!) 158/108   Pulse: (!) 115   Resp: 18   Temp: 97.8 ??F (36.6 ??C)            Physical Exam   Constitutional: She is oriented to person, place, and time. She appears well-developed and well-nourished. No distress.   HENT:   Head: Normocephalic and atraumatic.   Eyes: EOM are normal. Pupils are equal, round, and reactive to light.   Neck: Normal range of motion. Neck supple.   Cardiovascular: Normal rate, regular rhythm, normal heart sounds and intact distal pulses.    Pulmonary/Chest: Effort normal and breath sounds normal. No respiratory distress. She has no wheezes. She has no rales.   Abdominal: Soft. Bowel sounds are normal. There is no rebound and no guarding.   Genitourinary:   Genitourinary Comments: Right sided pelvic pain, and pain over the bladder on palpation as well.  No guarding or rebound noted.     Musculoskeletal: Normal range of motion.   Neurological: She is alert and oriented to person, place, and time. No cranial nerve deficit. She exhibits normal muscle tone. Coordination normal.   Skin: Skin is warm and dry.   Psychiatric: She has a normal mood and affect.   Nursing note and vitals reviewed.       MDM  Number of Diagnoses or Management Options  Elevated blood pressure reading:   Pelvic pain:   Right ovarian cyst:    Diagnosis management comments: PROGRESS NOTE:  One or more blood pressure readings were noted elevated during the Pt's presentation in the emergency department this date.  This abnormal reading has been cited in the Pt's diagnosis, and they have been encouraged to follow up with their primary care physician, or referred to a consultant for further evaluation and treatment.   Katherine Roan, NP  4:51 PM             Amount and/or Complexity of Data Reviewed  Clinical lab tests: ordered and reviewed    Risk of Complications, Morbidity, and/or Mortality  Presenting problems: low  Diagnostic procedures: low  Management options: low    Patient Progress  Patient progress: stable        ED Course       Pelvic Exam  Date/Time: 08/30/2016 6:28 PM  Performed by: Eusebio Me NP-C.  Procedure duration (minutes): 2.  Documented by: Eusebio Me NP-C.   Chaperone: Passenger transport manager.  Type of exam performed: bimanual and speculum.    External genitalia appearance: normal.    Vaginal exam:  normal.    Cervical exam:  not evaluated (Previous Hysterectomy).    Specimen(s) collected:  chlamydia and GC.  Bimanual exam:  left adenexal tenderness and right adenexal tenderness.                   Diagnosis:   1. Right ovarian cyst    2. Pelvic pain    3. Elevated blood pressure reading          Disposition:   Discharged to Home.      Follow-up Information     Follow up With Details Comments Contact Info    Kimber Relic, DO Call in the morning for a follow up    [address]  [address]  [place] [address]  [phone]            Patient's Medications   Start Taking    IBUPROFEN (MOTRIN) 800 MG TABLET    Take 1 Tab by mouth  every eight (8) hours as needed for Pain for up to 7 days.   Continue Taking    ACETAMINOPHEN (TYLENOL EXTRA STRENGTH) 500 MG TABLET    Take 500 mg by mouth every four (4) hours as needed for Pain. Indications: BACK PAIN    ALPRAZOLAM (XANAX) 1 MG TABLET    Take 1 mg by mouth two (2) times daily  as needed for Anxiety. Indications: ANXIETY    DEXTROAMPHETAMINE-AMPHETAMINE (ADDERALL) 30 MG TABLET    Take 60 mg by mouth three (3) times daily as neededIndications: ATTENTION-DEFICIT HYPERACTIVITY DISORDER.            GABAPENTIN (NEURONTIN) 600 MG TABLET    Take 600 mg by mouth three (3) times daily. Indications: ANXIETY    LIDOCAINE (LIDODERM) 5 %    Apply patch to the affected area for 12 hours a day and remove for 12 hours a day.    MAGNESIUM CITRATE SOLUTION    Take 1/3 of bottle every 8 hours until finished or until you have a bowel movement.    METHOCARBAMOL (ROBAXIN) 500 MG TABLET    Take 2 Tabs by mouth four (4) times daily.    POLYETHYLENE GLYCOL (MIRALAX) 17 GRAM/DOSE POWDER    Take 17 g by mouth daily as needed.    PREDNISONE (DELTASONE) 20 MG TABLET    Take 2 Tabs by mouth daily (with breakfast).    ZOLPIDEM (AMBIEN) 10 MG TABLET    Take 10 mg by mouth nightly as needed for Sleep.   These Medications have changed    No medications on file   Stop Taking    IBUPROFEN (MOTRIN IB) 200 MG TABLET    Take 800 mg by mouth every eight (8) hours as needed for Pain.

## 2016-08-30 NOTE — ED Notes (Signed)
Discharge medications reviewed with patient and appropriate educational materials and side effects teaching were provided. I have reviewed discharge instructions with the patient.  The patient verbalized understanding.

## 2016-08-30 NOTE — ED Notes (Shared)
I performed a brief evaluation, including history and physical, of the patient here in triage and I have determined that pt will need further treatment and evaluation from the main side ER physician.  I have placed initial orders to help in expediting patients care.     August 30, 2016 at 4:02 PM - Adrian Saranarl Wright, PA        Visit Vitals   ??? BP (!) 158/108 (BP Patient Position: At rest)   ??? Pulse (!) 115   ??? Temp 97.8 ??F (36.6 ??C)   ??? Resp 18          Scribe Attestation     [employer] acting as a Neurosurgeonscribe for and in the presence of Adrian SaranCarl Wright, [place]      August 30, 2016 at 4:00 PM       Provider Attestation:      I personally performed the services described in the documentation, reviewed the documentation, as recorded by the scribe in my presence, and it accurately and completely records my words and actions. August 30, 2016 at 4:00 PM -Adrian Saranarl Wright, [place]

## 2016-08-30 NOTE — ED Triage Notes (Signed)
Pt c/o RLQ abdominal pain and states "i think I have a UTI". Pt states she has hx UTI and ovary problems on right side. Pt also c/o nausea

## 2016-09-01 LAB — CHLAMYDIA/NEISSERIA AMPLIFICATION
Chlamydia amplification: NEGATIVE
N. gonorrhoeae amplification: NEGATIVE

## 2016-10-05 ENCOUNTER — Emergency Department: Admit: 2016-10-05 | Payer: Self-pay | Primary: Nurse Practitioner

## 2016-10-05 ENCOUNTER — Inpatient Hospital Stay
Admit: 2016-10-05 | Discharge: 2016-10-05 | Disposition: A | Discharge status: Home / Self Care | Payer: Self-pay | Attending: Emergency Medicine

## 2016-10-05 DIAGNOSIS — N83201 Unspecified ovarian cyst, right side: Secondary | ICD-10-CM

## 2016-10-05 LAB — URINALYSIS W/ RFLX MICROSCOPIC
Bilirubin: NEGATIVE
Blood: NEGATIVE
Glucose: NEGATIVE mg/dL
Ketone: NEGATIVE mg/dL
Nitrites: NEGATIVE
Protein: NEGATIVE mg/dL
Specific gravity: >1.03 — ABNORMAL HIGH (ref 1.005–1.030)
Urobilinogen: 1 EU/dL (ref 0.2–1.0)
pH (UA): 5 (ref 5.0–8.0)

## 2016-10-05 LAB — METABOLIC PANEL, BASIC
Anion gap: 10 mmol/L (ref 3.0–18)
BUN/Creatinine ratio: 11 — ABNORMAL LOW (ref 12–20)
BUN: 8 MG/DL (ref 7.0–18)
CO2: 24 mmol/L (ref 21–32)
Calcium: 9.1 MG/DL (ref 8.5–10.1)
Chloride: 108 mmol/L (ref 100–108)
Creatinine: 0.73 MG/DL (ref 0.6–1.3)
GFR est AA: >60 mL/min/{1.73_m2} (ref >60)
GFR est non-AA: >60 mL/min/{1.73_m2} (ref >60)
Glucose: 105 mg/dL — ABNORMAL HIGH (ref 74–99)
Potassium: 3.9 mmol/L (ref 3.5–5.5)
Sodium: 142 mmol/L (ref 136–145)

## 2016-10-05 LAB — CBC WITH AUTOMATED DIFF
ABS. BASOPHILS: 0 10*3/uL (ref 0.0–0.06)
ABS. EOSINOPHILS: 0 10*3/uL (ref 0.0–0.4)
ABS. LYMPHOCYTES: 1.8 10*3/uL (ref 0.9–3.6)
ABS. MONOCYTES: 0.9 10*3/uL (ref 0.05–1.2)
ABS. NEUTROPHILS: 4.6 10*3/uL (ref 1.8–8.0)
BASOPHILS: 0 % (ref 0–2)
EOSINOPHILS: 0 % (ref 0–5)
HCT: 40.8 % (ref 35.0–45.0)
HGB: 13.8 g/dL (ref 12.0–16.0)
LYMPHOCYTES: 25 % (ref 21–52)
MCH: 30.5 PG (ref 24.0–34.0)
MCHC: 33.8 g/dL (ref 31.0–37.0)
MCV: 90.3 FL (ref 74.0–97.0)
MONOCYTES: 12 % — ABNORMAL HIGH (ref 3–10)
MPV: 11.2 FL (ref 9.2–11.8)
NEUTROPHILS: 63 % (ref 40–73)
PLATELET: 299 10*3/uL (ref 135–420)
RBC: 4.52 M/uL (ref 4.20–5.30)
RDW: 12.9 % (ref 11.6–14.5)
WBC: 7.4 10*3/uL (ref 4.6–13.2)

## 2016-10-05 LAB — POC LACTIC ACID: Lactic Acid (POC): 1.5 mmol/L (ref 0.4–2.0)

## 2016-10-05 LAB — URINE MICROSCOPIC ONLY
RBC: 0 /hpf (ref 0–5)
WBC: 2 /hpf (ref 0–4)

## 2016-10-05 MED ORDER — HYDROMORPHONE 1 MG/ML SYRINGE
1 mg/mL | Freq: Once | INTRAMUSCULAR | Status: AC
Start: 2016-10-05 — End: 2016-10-05
  Administered 2016-10-05: 17:00:00 via INTRAVENOUS

## 2016-10-05 MED ORDER — HYDROMORPHONE 1 MG/ML SYRINGE
1 mg/mL | Freq: Once | INTRAMUSCULAR | Status: AC
Start: 2016-10-05 — End: 2016-10-05
  Administered 2016-10-05: 15:00:00 via INTRAVENOUS

## 2016-10-05 MED ORDER — HYDROCODONE-ACETAMINOPHEN 5 MG-325 MG TAB
5-325 mg | ORAL_TABLET | ORAL | 0 refills | Status: DC | PRN
Start: 2016-10-05 — End: 2016-10-23

## 2016-10-05 MED ORDER — ONDANSETRON (PF) 4 MG/2 ML INJECTION
4 mg/2 mL | INTRAMUSCULAR | Status: AC
Start: 2016-10-05 — End: 2016-10-05
  Administered 2016-10-05: 15:00:00 via INTRAVENOUS

## 2016-10-05 MED ORDER — SODIUM CHLORIDE 0.9% BOLUS IV
0.9 % | Freq: Once | INTRAVENOUS | Status: AC
Start: 2016-10-05 — End: 2016-10-05
  Administered 2016-10-05: 15:00:00 via INTRAVENOUS

## 2016-10-05 MED ORDER — ASPIRIN 325 MG TAB
325 mg | ORAL | Status: DC
Start: 2016-10-05 — End: 2016-10-05

## 2016-10-05 MED FILL — HYDROMORPHONE 1 MG/ML SYRINGE: 1 mg/mL | INTRAMUSCULAR | Qty: 2

## 2016-10-05 MED FILL — ONDANSETRON (PF) 4 MG/2 ML INJECTION: 4 mg/2 mL | INTRAMUSCULAR | Qty: 2

## 2016-10-05 MED FILL — SODIUM CHLORIDE 0.9 % IV: INTRAVENOUS | Qty: 1000

## 2016-10-05 NOTE — ED Provider Notes (Signed)
EMERGENCY DEPARTMENT HISTORY AND PHYSICAL EXAM    10:27 AM      Date: 10/05/2016  Patient Name: [name]    History of Presenting Illness     Chief Complaint   Patient presents with   ??? Abdominal Pain     RLQ abd pain   ??? Nausea         History Provided By: Patient    Chief Complaint: Abd pain  Duration: 2 Days  Timing:  Constant and Worsening  Location: RLQ  Quality: Sharp  Severity: 10 out of 10  Modifying Factors: none reported  Associated Symptoms: N/V      Additional History (Context): [name] is a 46 y.o. female with hypertension and anxiety who presents per EMS with worsening, severe RLQ pain x2 days. Associated sx include N/V. Hx ovarian torsion, partial hysterectomy, vaginal wall repair and scar tissue removal. No hx appendectomy. No etOH, tobacco use.         PCP: None    Current Facility-Administered Medications   Medication Dose Route Frequency Provider Last Rate Last Dose   ??? HYDROmorphone (DILAUDID) injection 0.5 mg  0.5 mg IntraVENous ONCE Burtis Junes, MD         Current Outpatient Prescriptions   Medication Sig Dispense Refill   ??? HYDROcodone-acetaminophen (NORCO) 5-325 mg per tablet Take 1 Tab by mouth every four (4) hours as needed for Pain. Max Daily Amount: 6 Tabs. 6 Tab 0   ??? methocarbamol (ROBAXIN) 500 mg tablet Take 2 Tabs by mouth four (4) times daily. 24 Tab 0   ??? predniSONE (DELTASONE) 20 mg tablet Take 2 Tabs by mouth daily (with breakfast). 10 Tab 0   ??? lidocaine (LIDODERM) 5 % Apply patch to the affected area for 12 hours a day and remove for 12 hours a day. 10 Each 0   ??? magnesium citrate solution Take 1/3 of bottle every 8 hours until finished or until you have a bowel movement. 1 Bottle 0   ??? dextroamphetamine-amphetamine (ADDERALL) 30 mg tablet Take 60 mg by mouth three (3) times daily as neededIndications: ATTENTION-DEFICIT HYPERACTIVITY DISORDER.             ??? ALPRAZolam (XANAX) 1 mg tablet Take 1 mg by mouth two (2) times daily as  needed for Anxiety. Indications: ANXIETY     ??? zolpidem (AMBIEN) 10 mg tablet Take 10 mg by mouth nightly as needed for Sleep.     ??? polyethylene glycol (MIRALAX) 17 gram/dose powder Take 17 g by mouth daily as needed.     ??? gabapentin (NEURONTIN) 600 mg tablet Take 600 mg by mouth three (3) times daily. Indications: ANXIETY         Past History     Past Medical History:  Past Medical History:   Diagnosis Date   ??? Adult ADHD    ??? Anxiety    ??? Chronic back pain    ??? Hypertension    ??? Musculoskeletal disorder    ??? Ovarian cyst    ??? Pelvic pain        Past Surgical History:  Past Surgical History:   Procedure Laterality Date   ??? HX GYN      hysterectomy   ??? HX GYN  11/05/2011    VAGINAL LACERATION REPAIR   ??? HX HYSTERECTOMY     ??? HX TUBAL LIGATION         Family History:  Family History   Problem Relation Age  of Onset   ??? Hypertension Mother    ??? Heart Disease Mother    ??? Cancer Father      died of colon cancer at 3045       Social History:  Social History   Substance Use Topics   ??? Smoking status: Never Smoker   ??? Smokeless tobacco: Never Used   ??? Alcohol use No       Allergies:  Allergies   Allergen Reactions   ??? Codeine Unknown (comments)     Pt denies allergy    ??? Codeine Itching   ??? Erythromycin Unknown (comments)   ??? Erythromycin Nausea and Vomiting         Review of Systems       Review of Systems   Constitutional: Positive for appetite change. Negative for chills and fever.   Gastrointestinal: Positive for abdominal pain (RLQ), nausea and vomiting.   All other systems reviewed and are negative.        Physical Exam     Visit Vitals   ??? BP 138/86 (BP 1 Location: Right arm, BP Patient Position: At rest;Sitting)   ??? Pulse 65   ??? Temp 98.1 ??F (36.7 ??C)   ??? Resp 18   ??? SpO2 100%         Physical Exam   Constitutional: She is oriented to person, place, and time. She appears well-developed and well-nourished. No distress.   HENT:   Head: Normocephalic and atraumatic.   Mouth/Throat: Oropharynx is clear and moist.    Eyes: Conjunctivae and EOM are normal. Pupils are equal, round, and reactive to light. No scleral icterus.   Neck: Normal range of motion. Neck supple.   Cardiovascular: Normal rate, regular rhythm and normal heart sounds.    No murmur heard.  Pulmonary/Chest: Effort normal and breath sounds normal. No respiratory distress.   Abdominal: Soft. Bowel sounds are normal. She exhibits no distension. There is tenderness in the right lower quadrant. There is no rebound and no guarding.   Musculoskeletal: She exhibits no edema.   Lymphadenopathy:     She has no cervical adenopathy.   Neurological: She is alert and oriented to person, place, and time. Coordination normal.   Skin: Skin is warm and dry. No rash noted.   Psychiatric: She has a normal mood and affect. Her behavior is normal.   Nursing note and vitals reviewed.        Diagnostic Study Results     Labs -  Recent Results (from the past 12 hour(s))   CBC WITH AUTOMATED DIFF    Collection Time: 10/05/16 10:46 AM   Result Value Ref Range    WBC 7.4 4.6 - 13.2 K/uL    RBC 4.52 4.20 - 5.30 M/uL    HGB 13.8 12.0 - 16.0 g/dL    HCT 78.240.8 95.635.0 - 21.345.0 %    MCV 90.3 74.0 - 97.0 FL    MCH 30.5 24.0 - 34.0 PG    MCHC 33.8 31.0 - 37.0 g/dL    RDW 08.612.9 57.811.6 - 46.914.5 %    PLATELET 299 135 - 420 K/uL    MPV 11.2 9.2 - 11.8 FL    NEUTROPHILS 63 40 - 73 %    LYMPHOCYTES 25 21 - 52 %    MONOCYTES 12 (H) 3 - 10 %    EOSINOPHILS 0 0 - 5 %    BASOPHILS 0 0 - 2 %    ABS. NEUTROPHILS 4.6 1.8 -  8.0 K/UL    ABS. LYMPHOCYTES 1.8 0.9 - 3.6 K/UL    ABS. MONOCYTES 0.9 0.05 - 1.2 K/UL    ABS. EOSINOPHILS 0.0 0.0 - 0.4 K/UL    ABS. BASOPHILS 0.0 0.0 - 0.06 K/UL    DF AUTOMATED     METABOLIC PANEL, BASIC    Collection Time: 10/05/16 10:46 AM   Result Value Ref Range    Sodium 142 136 - 145 mmol/L    Potassium 3.9 3.5 - 5.5 mmol/L    Chloride 108 100 - 108 mmol/L    CO2 24 21 - 32 mmol/L    Anion gap 10 3.0 - 18 mmol/L    Glucose 105 (H) 74 - 99 mg/dL    BUN 8 7.0 - 18 MG/DL     Creatinine 1.61 0.6 - 1.3 MG/DL    BUN/Creatinine ratio 11 (L) 12 - 20      GFR est AA >60 >60 ml/min/1.45m2    GFR est non-AA >60 >60 ml/min/1.53m2    Calcium 9.1 8.5 - 10.1 MG/DL   POC LACTIC ACID    Collection Time: 10/05/16 10:49 AM   Result Value Ref Range    Lactic Acid (POC) 1.5 0.4 - 2.0 mmol/L   URINALYSIS W/ RFLX MICROSCOPIC    Collection Time: 10/05/16 11:15 AM   Result Value Ref Range    Color DARK YELLOW      Appearance CLOUDY      Specific gravity >1.030 (H) 1.005 - 1.030    pH (UA) 5.0 5.0 - 8.0      Protein NEGATIVE  NEG mg/dL    Glucose NEGATIVE  NEG mg/dL    Ketone NEGATIVE  NEG mg/dL    Bilirubin NEGATIVE  NEG      Blood NEGATIVE  NEG      Urobilinogen 1.0 0.2 - 1.0 EU/dL    Nitrites NEGATIVE  NEG      Leukocyte Esterase TRACE (A) NEG         Radiologic Studies -   [place] PELV NON OB W TV   Final Result        [place] Pelv Non Ob W Tv    Result Date: 10/05/2016  EXAM: Ultrasound Pelvis: Transabdominal And Transvaginal HISTORY: Pelvic pain. COMPARISON: 08/30/2016. TECHNIQUE: Real-time transabdominal and transvaginal  sonography in multiple planes was performed with image documentation. Grayscale, color flow Doppler imaging, and velocity spectral waveform analysis of the ovaries  was performed (duplex imaging). ___________________ FINDINGS: UTERUS: Surgically absent. RIGHT OVARY and ADNEXA: The right ovary is normal in size, measuring 3.6 x 1.9 x 3.7 cm. There is a right sided round hypoechoic 1.6 cm lesion and a separate anechoic cyst. No adnexal masses identified. Color and spectral Doppler demonstrate normal flow to the right ovary with no evidence of ovarian torsion. The systolic and venous waveforms are within normal limits. LEFT OVARY and ADNEXA: The left ovary is not sonographically identified, secondary to bowel gas No adnexal masses identified. Color and spectral Doppler demonstrate normal flow to the left ovary with no evidence of ovarian torsion. The systolic and venous waveforms are within normal  limits.Marland Kitchen FREE FLUID: No free fluid identified. _________________    IMPRESSION: 1. Prior hysterectomy. 2. There are 2 right ovarian lesions, one hypoechoic and rounded, potentially decreasing hemorrhagic cyst to the prior exam. The second is a benign cyst. Recommend follow-up pelvic ultrasound in 6 weeks' time to document continued improvement. 3. Nonvisualized left ovary secondary to prominent left adnexal bowel gas.  Medical Decision Making   I am the first provider for this patient.    I reviewed the vital signs, available nursing notes, past medical history, past surgical history, family history and social history.    Vital Signs-Reviewed the patient's vital signs.    Pulse Oximetry Analysis -  100% on room air (Interpretation) wnl    Cardiac Monitor:  Rate: 65      Records Reviewed: Nursing Notes and Old Medical Records (Time of Review: 10:27 AM)    ED Course: Progress Notes, Reevaluation, and Consults:    12:49 Pt with no Mcburneys tenderness, ttp deeper in pelvic area, no peritoneal signs, doubt acute appendicitis after 3 days of pain. Nml white count. Will treat for pain related to cyst. Pt requesting case management for [facility].      Provider Notes (Medical Decision Making):   MDM  Number of Diagnoses or Management Options  Right ovarian cyst:   Diagnosis management comments: H/o R ovarian cyst h/o torsion with surgery in past now increased pain  X 3 days worse today no peritoneal signs in Ed     [place] and labs reviewed doubt acute appendicitis after 3 days with no peritoneal signs or leukocytosis  of pain will dc home        Amount and/or Complexity of Data Reviewed  Clinical lab tests: ordered and reviewed  Tests in the radiology section of CPT??: ordered and reviewed                Diagnosis     Clinical Impression:   1. Right ovarian cyst        Disposition: Discharge     Follow-up Information     Follow up With Details Comments Contact Info     [facility] EMERGENCY DEPT  As needed, If symptoms worsen [address]  [place] [address]  [phone]           Patient's Medications   Start Taking    HYDROCODONE-ACETAMINOPHEN (NORCO) 5-325 MG PER TABLET    Take 1 Tab by mouth every four (4) hours as needed for Pain. Max Daily Amount: 6 Tabs.   Continue Taking    ALPRAZOLAM (XANAX) 1 MG TABLET    Take 1 mg by mouth two (2) times daily as needed for Anxiety. Indications: ANXIETY    DEXTROAMPHETAMINE-AMPHETAMINE (ADDERALL) 30 MG TABLET    Take 60 mg by mouth three (3) times daily as neededIndications: ATTENTION-DEFICIT HYPERACTIVITY DISORDER.            GABAPENTIN (NEURONTIN) 600 MG TABLET    Take 600 mg by mouth three (3) times daily. Indications: ANXIETY    LIDOCAINE (LIDODERM) 5 %    Apply patch to the affected area for 12 hours a day and remove for 12 hours a day.    MAGNESIUM CITRATE SOLUTION    Take 1/3 of bottle every 8 hours until finished or until you have a bowel movement.    METHOCARBAMOL (ROBAXIN) 500 MG TABLET    Take 2 Tabs by mouth four (4) times daily.    POLYETHYLENE GLYCOL (MIRALAX) 17 GRAM/DOSE POWDER    Take 17 g by mouth daily as needed.    PREDNISONE (DELTASONE) 20 MG TABLET    Take 2 Tabs by mouth daily (with breakfast).    ZOLPIDEM (AMBIEN) 10 MG TABLET    Take 10 mg by mouth nightly as needed for Sleep.   These Medications have changed    No medications on file  Stop Taking    ACETAMINOPHEN (TYLENOL EXTRA STRENGTH) 500 MG TABLET    Take 500 mg by mouth every four (4) hours as needed for Pain. Indications: BACK PAIN     _______________________________    Attestations:  Scribe Attestation     London Pepper acting as a scribe for and in the presence of Burtis Junes, MD      October 05, 2016 at 12:56 PM       Provider Attestation:      I personally performed the services described in the documentation, reviewed the documentation, as recorded by the scribe in my presence, and  it accurately and completely records my words and actions. October 05, 2016 at 12:56 PM - Burtis Junes, MD    _______________________________

## 2016-10-05 NOTE — ED Triage Notes (Signed)
EMS reports pt was brought from Urgent Care: C/o RLQ abd pain with nausea; given 60 mg Toradol IM at Urgent Care; Hx Ovarian Torsion 2017

## 2016-10-05 NOTE — Progress Notes (Signed)
Referral received from ED MD to assist with GYN follow up. Chart reviewed. Verified demographics and pt states that [place]. Pt prefers to have appointment near her. Demographics updated by registration. GYN appointment scheduled with Dr Ted McalpineUrenna Acholonu on 11/08/16 at 1000 check in at 0945. Information given to pt and ED MD made aware.

## 2016-10-05 NOTE — ED Notes (Signed)
No pain assessment performed; pt off floor for testing

## 2016-10-23 ENCOUNTER — Inpatient Hospital Stay
Admit: 2016-10-23 | Discharge: 2016-10-23 | Disposition: A | Discharge status: Home / Self Care | Payer: Self-pay | Attending: Emergency Medicine

## 2016-10-23 DIAGNOSIS — N83201 Unspecified ovarian cyst, right side: Secondary | ICD-10-CM

## 2016-10-23 LAB — CBC WITH AUTOMATED DIFF
ABS. BASOPHILS: 0 10*3/uL (ref 0.0–0.1)
ABS. EOSINOPHILS: 0.1 10*3/uL (ref 0.0–0.4)
ABS. LYMPHOCYTES: 2.6 10*3/uL (ref 0.9–3.6)
ABS. MONOCYTES: 1 10*3/uL (ref 0.05–1.2)
ABS. NEUTROPHILS: 5.7 10*3/uL (ref 1.8–8.0)
BASOPHILS: 0 % (ref 0–2)
EOSINOPHILS: 1 % (ref 0–5)
HCT: 40.8 % (ref 35.0–45.0)
HGB: 13.7 g/dL (ref 12.0–16.0)
LYMPHOCYTES: 27 % (ref 21–52)
MCH: 30.6 PG (ref 24.0–34.0)
MCHC: 33.6 g/dL (ref 31.0–37.0)
MCV: 91.1 FL (ref 74.0–97.0)
MONOCYTES: 11 % — ABNORMAL HIGH (ref 3–10)
MPV: 10.1 FL (ref 9.2–11.8)
NEUTROPHILS: 61 % (ref 40–73)
PLATELET: 348 10*3/uL (ref 135–420)
RBC: 4.48 M/uL (ref 4.20–5.30)
RDW: 12.9 % (ref 11.6–14.5)
WBC: 9.4 10*3/uL (ref 4.6–13.2)

## 2016-10-23 LAB — URINALYSIS W/ RFLX MICROSCOPIC
Bilirubin: NEGATIVE
Blood: NEGATIVE
Glucose: NEGATIVE mg/dL
Ketone: NEGATIVE mg/dL
Leukocyte Esterase: NEGATIVE
Nitrites: POSITIVE — AB
Protein: NEGATIVE mg/dL
Specific gravity: >1.03 — ABNORMAL HIGH (ref 1.005–1.030)
Urobilinogen: 1 EU/dL (ref 0.2–1.0)
pH (UA): 5 (ref 5.0–8.0)

## 2016-10-23 LAB — METABOLIC PANEL, BASIC
Anion gap: 5 mmol/L (ref 3.0–18)
BUN/Creatinine ratio: 14 (ref 12–20)
BUN: 10 MG/DL (ref 7.0–18)
CO2: 31 mmol/L (ref 21–32)
Calcium: 9.4 MG/DL (ref 8.5–10.1)
Chloride: 108 mmol/L (ref 100–108)
Creatinine: 0.69 MG/DL (ref 0.6–1.3)
GFR est AA: >60 mL/min/{1.73_m2} (ref >60)
GFR est non-AA: >60 mL/min/{1.73_m2} (ref >60)
Glucose: 87 mg/dL (ref 74–99)
Potassium: 3.8 mmol/L (ref 3.5–5.5)
Sodium: 144 mmol/L (ref 136–145)

## 2016-10-23 LAB — URINE MICROSCOPIC ONLY: WBC: 0 /hpf (ref 0–4)

## 2016-10-23 MED ORDER — SODIUM CHLORIDE 0.9% BOLUS IV
0.9 % | Freq: Once | INTRAVENOUS | Status: AC
Start: 2016-10-23 — End: 2016-10-23
  Administered 2016-10-23: 21:00:00 via INTRAVENOUS

## 2016-10-23 MED ORDER — HYDROCODONE-ACETAMINOPHEN 5 MG-325 MG TAB
5-325 mg | ORAL_TABLET | ORAL | 0 refills | Status: DC | PRN
Start: 2016-10-23 — End: 2016-11-14

## 2016-10-23 MED ORDER — LIDOCAINE (PF) 20 MG/ML (2 %) IV SYRINGE
100 mg/5 mL (2 %) | Freq: Once | INTRAVENOUS | Status: AC
Start: 2016-10-23 — End: 2016-10-23
  Administered 2016-10-23: 21:00:00 via INTRAVENOUS

## 2016-10-23 MED ORDER — ONDANSETRON (PF) 4 MG/2 ML INJECTION
4 mg/2 mL | INTRAMUSCULAR | Status: AC
Start: 2016-10-23 — End: 2016-10-23
  Administered 2016-10-23: 21:00:00 via INTRAVENOUS

## 2016-10-23 MED ORDER — KETOROLAC TROMETHAMINE 30 MG/ML INJECTION
30 mg/mL (1 mL) | INTRAMUSCULAR | Status: AC
Start: 2016-10-23 — End: 2016-10-23
  Administered 2016-10-23: 21:00:00 via INTRAVENOUS

## 2016-10-23 MED FILL — SODIUM CHLORIDE 0.9 % IV: INTRAVENOUS | Qty: 1000

## 2016-10-23 MED FILL — LIDOCAINE (PF) 20 MG/ML (2 %) IV SYRINGE: 100 mg/5 mL (2 %) | INTRAVENOUS | Qty: 5

## 2016-10-23 MED FILL — ONDANSETRON (PF) 4 MG/2 ML INJECTION: 4 mg/2 mL | INTRAMUSCULAR | Qty: 2

## 2016-10-23 MED FILL — KETOROLAC TROMETHAMINE 30 MG/ML INJECTION: 30 mg/mL (1 mL) | INTRAMUSCULAR | Qty: 1

## 2016-10-23 NOTE — Progress Notes (Signed)
Called and spoke to husband.  He doesn't know where is wife is. Told husband it was important for patient to call department.  He says he understands

## 2016-10-23 NOTE — ED Provider Notes (Signed)
EMERGENCY DEPARTMENT HISTORY AND PHYSICAL EXAM    4:38 PM      Date: 10/23/2016  Patient Name: [name]    History of Presenting Illness     Chief Complaint   Patient presents with   ??? Abdominal Pain   ??? Nausea         History Provided By: Patient    Chief Complaint: abd pain  Duration:  Weeks  Timing:  Constant  Location: right side abd  Severity: 8 out of 10  Modifying Factors: Pt used a heating pad with no relief of sx., She had a partial hysterectomy in the past and has an appointment with Ob on the 30 th but would like use to see if we can get her an appointment sooner.  Associated Symptoms: nausea. Denies back pain.      Additional History (Context): [name] is a 46 y.o. female with hypertension who presents with constant 8/10 right sided abd pain that has been constant for the last few weeks. Pt used a heating pad with no relief of sx., She had a partial hysterectomy in the past and has an appointment with Ob on the 30 th but would like use to see if we can get her an appointment sooner. Associated sx are nausea. Denies back pain. Pt does not smoke or drink.    PCP: None    Current Facility-Administered Medications   Medication Dose Route Frequency Provider Last Rate Last Dose   ??? sodium chloride 0.9 % bolus infusion 1,000 mL  1,000 mL IntraVENous ONCE Burtis JunesIlene L Pritika Alvarez, MD 1,000 mL/hr at 10/23/16 1650 1,000 mL at 10/23/16 1650     Current Outpatient Prescriptions   Medication Sig Dispense Refill   ??? HYDROcodone-acetaminophen (NORCO) 5-325 mg per tablet Take 1 Tab by mouth every four (4) hours as needed for Pain. Max Daily Amount: 6 Tabs. 6 Tab 0       Past History     Past Medical History:  Past Medical History:   Diagnosis Date   ??? Adult ADHD    ??? Anxiety    ??? Chronic back pain    ??? Hypertension    ??? Musculoskeletal disorder    ??? Ovarian cyst    ??? Pelvic pain        Past Surgical History:  Past Surgical History:   Procedure Laterality Date   ??? HX GYN      hysterectomy   ??? HX GYN  11/05/2011     VAGINAL LACERATION REPAIR   ??? HX HYSTERECTOMY     ??? HX TUBAL LIGATION         Family History:  Family History   Problem Relation Age of Onset   ??? Hypertension Mother    ??? Heart Disease Mother    ??? Cancer Father      died of colon cancer at 7945       Social History:  Social History   Substance Use Topics   ??? Smoking status: Never Smoker   ??? Smokeless tobacco: Never Used   ??? Alcohol use No       Allergies:  Allergies   Allergen Reactions   ??? Codeine Unknown (comments)     Pt denies allergy    ??? Codeine Itching   ??? Erythromycin Unknown (comments)   ??? Erythromycin Nausea and Vomiting         Review of Systems       Review of Systems   Constitutional:  Positive for appetite change.   Gastrointestinal: Positive for abdominal pain and nausea.   Musculoskeletal: Negative for back pain.   All other systems reviewed and are negative.        Physical Exam     Visit Vitals   ??? BP 134/88   ??? Pulse 80   ??? Temp 97.8 ??F (36.6 ??C)   ??? Resp 16   ??? Ht 5\' 6"  (1.676 m)   ??? Wt 86.2 kg (190 lb)   ??? SpO2 100%   ??? BMI 30.67 kg/m2         Physical Exam   Constitutional: She is oriented to person, place, and time. She appears well-developed and well-nourished. No distress.   HENT:   Head: Normocephalic and atraumatic.   Mouth/Throat: Oropharynx is clear and moist.   Eyes: Conjunctivae and EOM are normal. Pupils are equal, round, and reactive to light. No scleral icterus.   Neck: Normal range of motion. Neck supple.   Cardiovascular: Normal rate, regular rhythm and normal heart sounds.    No murmur heard.  Pulmonary/Chest: Effort normal and breath sounds normal. No respiratory distress.   Abdominal: Soft. Bowel sounds are normal. She exhibits no distension. There is tenderness (around pelvis) in the right lower quadrant. There is no CVA tenderness and no tenderness at McBurney's point.   Musculoskeletal: She exhibits no edema.   Lymphadenopathy:     She has no cervical adenopathy.    Neurological: She is alert and oriented to person, place, and time. Coordination normal.   Skin: Skin is warm and dry. No rash noted.   Psychiatric: She has a normal mood and affect. Her behavior is normal.   Nursing note and vitals reviewed.        Diagnostic Study Results     Labs -  Recent Results (from the past 12 hour(s))   URINALYSIS W/ RFLX MICROSCOPIC    Collection Time: 10/23/16  4:09 PM   Result Value Ref Range    Color DARK YELLOW      Appearance TURBID      Specific gravity >1.030 (H) 1.005 - 1.030    pH (UA) 5.0 5.0 - 8.0      Protein NEGATIVE  NEG mg/dL    Glucose NEGATIVE  NEG mg/dL    Ketone NEGATIVE  NEG mg/dL    Bilirubin NEGATIVE  NEG      Blood NEGATIVE  NEG      Urobilinogen 1.0 0.2 - 1.0 EU/dL    Nitrites POSITIVE (A) NEG      Leukocyte Esterase NEGATIVE  NEG     URINE MICROSCOPIC ONLY    Collection Time: 10/23/16  4:09 PM   Result Value Ref Range    WBC 0 to 3 0 - 4 /hpf    RBC NONE 0 - 5 /hpf    Epithelial cells 3+ 0 - 5 /lpf    Bacteria 4+ (A) NEG /hpf   CBC WITH AUTOMATED DIFF    Collection Time: 10/23/16  4:19 PM   Result Value Ref Range    WBC 9.4 4.6 - 13.2 K/uL    RBC 4.48 4.20 - 5.30 M/uL    HGB 13.7 12.0 - 16.0 g/dL    HCT 62.140.8 30.835.0 - 65.745.0 %    MCV 91.1 74.0 - 97.0 FL    MCH 30.6 24.0 - 34.0 PG    MCHC 33.6 31.0 - 37.0 g/dL    RDW 84.612.9 96.211.6 - 95.214.5 %  PLATELET 348 135 - 420 K/uL    MPV 10.1 9.2 - 11.8 FL    NEUTROPHILS 61 40 - 73 %    LYMPHOCYTES 27 21 - 52 %    MONOCYTES 11 (H) 3 - 10 %    EOSINOPHILS 1 0 - 5 %    BASOPHILS 0 0 - 2 %    ABS. NEUTROPHILS 5.7 1.8 - 8.0 K/UL    ABS. LYMPHOCYTES 2.6 0.9 - 3.6 K/UL    ABS. MONOCYTES 1.0 0.05 - 1.2 K/UL    ABS. EOSINOPHILS 0.1 0.0 - 0.4 K/UL    ABS. BASOPHILS 0.0 0.0 - 0.1 K/UL    DF AUTOMATED     METABOLIC PANEL, BASIC    Collection Time: 10/23/16  4:19 PM   Result Value Ref Range    Sodium 144 136 - 145 mmol/L    Potassium 3.8 3.5 - 5.5 mmol/L    Chloride 108 100 - 108 mmol/L    CO2 31 21 - 32 mmol/L    Anion gap 5 3.0 - 18 mmol/L     Glucose 87 74 - 99 mg/dL    BUN 10 7.0 - 18 MG/DL    Creatinine 1.61 0.6 - 1.3 MG/DL    BUN/Creatinine ratio 14 12 - 20      GFR est AA >60 >60 ml/min/1.70m2    GFR est non-AA >60 >60 ml/min/1.54m2    Calcium 9.4 8.5 - 10.1 MG/DL       Radiologic Studies -   No orders to display         Medical Decision Making   I am the first provider for this patient.    I reviewed the vital signs, available nursing notes, past medical history, past surgical history, family history and social history.    Vital Signs-Reviewed the patient's vital signs.    Records Reviewed: Nursing Notes (Time of Review: 4:38 PM)    ED Course: Progress Notes, Reevaluation, and Consults:      Provider Notes (Medical Decision Making):   MDM  Number of Diagnoses or Management Options  Right ovarian cyst:   Diagnosis management comments: Known to me from previous visit for R ovarian cyst s/p hysterectomy mild R lower quad tenderness doubt appe with repeat pain from last month            Amount and/or Complexity of Data Reviewed  Clinical lab tests: ordered and reviewed            Diagnosis     Clinical Impression:   1. Right ovarian cyst        Disposition: home    Follow-up Information     Follow up With Details Comments Contact Info    [facility] EMERGENCY DEPT  As needed, If symptoms worsen [address]  [place] [address]  [phone]    Janus Molder, MD Schedule an appointment as soon as possible for a visit for ED follow up appointment  [address]  [address]  [place] [address]  [phone]      Newman Pies, MD Schedule an appointment as soon as possible for a visit for Ed follow up appointment  [address] [facility]  [address]   [facility]  [place] [address]  [phone]             Patient's Medications   Start Taking    No medications on file   Continue Taking    HYDROCODONE-ACETAMINOPHEN (NORCO) 5-325 MG  PER TABLET    Take 1 Tab by mouth every four (4) hours as needed for Pain. Max Daily Amount: 6 Tabs.    These Medications have changed    No medications on file   Stop Taking    ALPRAZOLAM (XANAX) 1 MG TABLET    Take 1 mg by mouth two (2) times daily as needed for Anxiety. Indications: ANXIETY    DEXTROAMPHETAMINE-AMPHETAMINE (ADDERALL) 30 MG TABLET    Take 60 mg by mouth three (3) times daily as neededIndications: ATTENTION-DEFICIT HYPERACTIVITY DISORDER.            GABAPENTIN (NEURONTIN) 600 MG TABLET    Take 600 mg by mouth three (3) times daily. Indications: ANXIETY    LIDOCAINE (LIDODERM) 5 %    Apply patch to the affected area for 12 hours a day and remove for 12 hours a day.    MAGNESIUM CITRATE SOLUTION    Take 1/3 of bottle every 8 hours until finished or until you have a bowel movement.    METHOCARBAMOL (ROBAXIN) 500 MG TABLET    Take 2 Tabs by mouth four (4) times daily.    POLYETHYLENE GLYCOL (MIRALAX) 17 GRAM/DOSE POWDER    Take 17 g by mouth daily as needed.    PREDNISONE (DELTASONE) 20 MG TABLET    Take 2 Tabs by mouth daily (with breakfast).    ZOLPIDEM (AMBIEN) 10 MG TABLET    Take 10 mg by mouth nightly as needed for Sleep.     _______________________________    Attestations:  Scribe Attestation     Kathleene Hazel acting as a scribe for and in the presence of Burtis Junes, MD      October 23, 2016 at 4:38 PM       Provider Attestation:      I personally performed the services described in the documentation, reviewed the documentation, as recorded by the scribe in my presence, and it accurately and completely records my words and actions. October 23, 2016 at 4:38 PM - Burtis Junes, MD    _______________________________

## 2016-10-23 NOTE — ED Notes (Signed)
Pt ambualted to restroom. NAD. Changing into gown now. No gaurding or other s/s of pain.

## 2016-10-23 NOTE — ED Triage Notes (Signed)
R lower abdominal pain and L lower abdominal pain, R pain is worse than L pain, states the pain is the same as the last visit

## 2016-10-23 NOTE — Progress Notes (Signed)
Pt not discharged on antibiotics

## 2016-10-23 NOTE — ED Notes (Cosign Needed)
Pt was seen at [facility] two days ago and placed on keflex

## 2016-10-23 NOTE — Progress Notes (Signed)
Attempted to call pt at 11:37 AM. First # on file is not working, I called her husband's cell and left a VM for her to call the ED back regarding lab results. Terrance Lanahan J Keifer Habib, PA-C 11:37 AM

## 2016-10-23 NOTE — ED Notes (Cosign Needed)
PROGRESS NOTE:  Multiple calls have been made regarding UTI.  Sent card to Pt's home.  If she calls back or comes in, place her on an ABX.  Katherine RoanEdward Windsor Goeken, NP  3:56 PM

## 2016-10-26 LAB — CULTURE, URINE: Culture result:: >100000 — AB

## 2016-11-08 ENCOUNTER — Encounter: Attending: Obstetrics & Gynecology | Primary: Nurse Practitioner

## 2016-11-14 ENCOUNTER — Inpatient Hospital Stay
Admit: 2016-11-14 | Discharge: 2016-11-14 | Disposition: A | Discharge status: Home / Self Care | Payer: Self-pay | Attending: Emergency Medicine

## 2016-11-14 DIAGNOSIS — N83201 Unspecified ovarian cyst, right side: Secondary | ICD-10-CM

## 2016-11-14 LAB — URINALYSIS W/ RFLX MICROSCOPIC
Bilirubin: NEGATIVE
Blood: NEGATIVE
Glucose: NEGATIVE mg/dL
Ketone: NEGATIVE mg/dL
Nitrites: NEGATIVE
Protein: NEGATIVE mg/dL
Specific gravity: 1.01 (ref 1.005–1.030)
Urobilinogen: 1 EU/dL (ref 0.2–1.0)
pH (UA): 6.5 (ref 5.0–8.0)

## 2016-11-14 LAB — URINE MICROSCOPIC ONLY
Bacteria: NEGATIVE /hpf
RBC: 0 /hpf (ref 0–5)
WBC: 1 /hpf (ref 0–4)

## 2016-11-14 MED ORDER — HYDROCODONE-ACETAMINOPHEN 5 MG-325 MG TAB
5-325 mg | ORAL_TABLET | Freq: Four times a day (QID) | ORAL | 0 refills | Status: DC | PRN
Start: 2016-11-14 — End: 2016-12-10

## 2016-11-14 NOTE — ED Notes (Signed)
Patient discharged to home with Rx and instructions, patient with no questions and voices understanding at this time

## 2016-11-14 NOTE — ED Triage Notes (Signed)
Patient with pelvic pain and pressure with voiding

## 2016-11-14 NOTE — ED Provider Notes (Signed)
EMERGENCY DEPARTMENT HISTORY AND PHYSICAL EXAM    Date: 11/14/2016  Patient Name: [name]    History of Presenting Illness     Chief Complaint   Patient presents with   ??? Pelvic Pain   ??? Urinary Pain         History Provided By: Patient    Chief Complaint: right ovarian pain  Duration: 1 Months  Timing:  Gradual, Intermittent and Worsening  Location: right lower quadrant  Quality: Aching and Cramping  Severity: 6 out of 10  Modifying Factors: none  Associated Symptoms: denies any other associated signs or symptoms      HPI: [name] is a 46 y.o. female with a PMH of HTN, ADHD, Chronid pain, ovarian cyst who presents to the ER c/o chronic right pelvic pain.  Patient states she has a cyst on her right ovary.  She has a follow up appt with OBGYN on 8/31.  She has been seen multiple times between [place] and [place] seeking pain meds due to her continued pain. She states trying to use tylenol and motrin with no relief.  She has had a partial hysterectomy and still has both ovaries.  She was recently treated for a UTI, but reports symptoms have resolved.  No other symptoms or complaints.    PCP: None        Past History     Past Medical History:  Past Medical History:   Diagnosis Date   ??? Adult ADHD    ??? Anxiety    ??? Chronic back pain    ??? Hypertension    ??? Musculoskeletal disorder    ??? Ovarian cyst    ??? Pelvic pain        Past Surgical History:  Past Surgical History:   Procedure Laterality Date   ??? HX GYN      hysterectomy   ??? HX GYN  11/05/2011    VAGINAL LACERATION REPAIR   ??? HX HYSTERECTOMY     ??? HX TUBAL LIGATION         Family History:  Family History   Problem Relation Age of Onset   ??? Hypertension Mother    ??? Heart Disease Mother    ??? Cancer Father      died of colon cancer at 79       Social History:  Social History   Substance Use Topics   ??? Smoking status: Never Smoker   ??? Smokeless tobacco: Never Used   ??? Alcohol use No       Allergies:  Allergies   Allergen Reactions    ??? Codeine Unknown (comments)     Pt denies allergy    ??? Codeine Itching   ??? Erythromycin Unknown (comments)   ??? Erythromycin Nausea and Vomiting         Review of Systems   Review of Systems   Constitutional: Negative for chills, fatigue and fever.   HENT: Negative.  Negative for sore throat.    Eyes: Negative.    Respiratory: Negative for cough and shortness of breath.    Cardiovascular: Negative for chest pain and palpitations.   Gastrointestinal: Negative for abdominal pain, nausea and vomiting.   Genitourinary: Negative for dysuria.        Right ovary pain   Musculoskeletal: Negative.    Skin: Negative.    Neurological: Negative for dizziness, weakness, light-headedness and headaches.   Psychiatric/Behavioral: Negative.    All other systems reviewed and are negative.  Physical Exam     Vitals:    11/14/16 1440 11/14/16 1504   BP:  124/62   Pulse:  90   Resp:  16   Temp:  98.9 ??F (37.2 ??C)   SpO2:  98%   Weight: 81.6 kg (180 lb)    Height: 5\' 6"  (1.676 m)      Physical Exam   Constitutional: She is oriented to person, place, and time. She appears well-developed and well-nourished. No distress.   HENT:   Head: Normocephalic and atraumatic.   Mouth/Throat: Oropharynx is clear and moist.   Eyes: Conjunctivae are normal. No scleral icterus.   Neck: Neck supple. No JVD present. No tracheal deviation present.   Cardiovascular: Normal rate, regular rhythm and normal heart sounds.    Pulmonary/Chest: Effort normal and breath sounds normal. No respiratory distress. She has no wheezes.   Abdominal: Soft. Normal appearance and bowel sounds are normal. There is tenderness in the right lower quadrant. There is no rigidity, no rebound, no guarding, no CVA tenderness, no tenderness at McBurney's point and negative Murphy's sign.   No peritoneal signs on exam   Musculoskeletal: Normal range of motion.   Neurological: She is alert and oriented to person, place, and time. She  has normal strength. Gait normal. GCS eye subscore is 4. GCS verbal subscore is 5. GCS motor subscore is 6.   Skin: Skin is warm and dry. She is not diaphoretic.   Psychiatric: She has a normal mood and affect.   Nursing note and vitals reviewed.        Diagnostic Study Results     Labs -     Recent Results (from the past 12 hour(s))   URINALYSIS W/ RFLX MICROSCOPIC    Collection Time: 11/14/16  2:45 PM   Result Value Ref Range    Color YELLOW      Appearance CLOUDY      Specific gravity 1.010 1.005 - 1.030      pH (UA) 6.5 5.0 - 8.0      Protein NEGATIVE  NEG mg/dL    Glucose NEGATIVE  NEG mg/dL    Ketone NEGATIVE  NEG mg/dL    Bilirubin NEGATIVE  NEG      Blood NEGATIVE  NEG      Urobilinogen 1.0 0.2 - 1.0 EU/dL    Nitrites NEGATIVE  NEG      Leukocyte Esterase TRACE (A) NEG     URINE MICROSCOPIC ONLY    Collection Time: 11/14/16  2:45 PM   Result Value Ref Range    WBC 1 to 3 0 - 4 /hpf    RBC 0 0 - 5 /hpf    Epithelial cells 2+ 0 - 5 /lpf    Bacteria NEGATIVE  NEG /hpf    Yeast FEW (A) NEG         Radiologic Studies -   No orders to display     CT Results  (Last 48 hours)    None        CXR Results  (Last 48 hours)    None            Medical Decision Making   I am the first provider for this patient.    I reviewed the vital signs, available nursing notes, past medical history, past surgical history, family history and social history.    Vital Signs-Reviewed the patient's vital signs.    Records Reviewed: Nursing Notes, Old Medical Records, Previous Radiology Studies  and Previous Laboratory Studies    ED Course:   3:18 PM  46 y/o female c/o chronic right lower quadrant/ovarian pain.  States obgyn follow up appt is 8/31.  Does not have primary.  Seen multiple times between here and [facility] over last month for same symptoms.  Taking tylenol, motrin with no relief.  Also using heating pad.  Review of PMP is concerning for drug seeking behavior.  Will plan on repeat UA to ensure no  further infection.  Pt just had ct on 8/25 at [facility] w/ no acute findings.  Based on presentation, no clinical indication for further labs/imaging at this time.  Pt given hot pack.  Will plan on d/c with limited prescription.  Advised to call Monday to OBGYN due to frequent ER visits.  All questions answered and patient in agreement with plan of care.  Will plan for discharge.  Dwan Bolt, PA-C    Disposition:  Discharged    DISCHARGE NOTE:       Care plan outlined and precautions discussed.  Patient has no new complaints, changes, or physical findings.  Results of ua were reviewed with the patient. All medications were reviewed with the patient; will d/c home with norco. All of pt's questions and concerns were addressed. Patient was instructed and agrees to follow up with obgyn, as well as to return to the ED upon further deterioration. Patient is ready to go home.    Follow-up Information     Follow up With Details Comments Contact Info    [facility] EMERGENCY DEPT  If symptoms worsen [address]  [place] [address]  [phone]          Current Discharge Medication List      START taking these medications    Details   HYDROcodone-acetaminophen (NORCO) 5-325 mg per tablet Take 1 Tab by mouth every six (6) hours as needed for Pain. Max Daily Amount: 4 Tabs.  Qty: 8 Tab, Refills: 0    Associated Diagnoses: Cyst of right ovary             Provider Notes (Medical Decision Making):     Procedures:  Procedures        Diagnosis     Clinical Impression:   1. Cyst of right ovary

## 2016-12-10 ENCOUNTER — Inpatient Hospital Stay
Admit: 2016-12-10 | Discharge: 2016-12-10 | Disposition: A | Discharge status: Home / Self Care | Payer: Self-pay | Attending: Emergency Medicine

## 2016-12-10 ENCOUNTER — Emergency Department: Admit: 2016-12-10 | Payer: Self-pay | Primary: Nurse Practitioner

## 2016-12-10 DIAGNOSIS — R1031 Right lower quadrant pain: Secondary | ICD-10-CM

## 2016-12-10 LAB — METABOLIC PANEL, BASIC
Anion gap: 8 mmol/L (ref 3.0–18)
BUN/Creatinine ratio: 13 (ref 12–20)
BUN: 9 MG/DL (ref 7.0–18)
CO2: 26 mmol/L (ref 21–32)
Calcium: 9.3 MG/DL (ref 8.5–10.1)
Chloride: 107 mmol/L (ref 100–108)
Creatinine: 0.69 MG/DL (ref 0.6–1.3)
GFR est AA: >60 mL/min/{1.73_m2} (ref >60)
GFR est non-AA: >60 mL/min/{1.73_m2} (ref >60)
Glucose: 85 mg/dL (ref 74–99)
Potassium: 3.8 mmol/L (ref 3.5–5.5)
Sodium: 141 mmol/L (ref 136–145)

## 2016-12-10 LAB — CBC WITH AUTOMATED DIFF
ABS. BASOPHILS: 0 10*3/uL (ref 0.0–0.1)
ABS. EOSINOPHILS: 0.1 10*3/uL (ref 0.0–0.4)
ABS. LYMPHOCYTES: 2 10*3/uL (ref 0.9–3.6)
ABS. MONOCYTES: 0.7 10*3/uL (ref 0.05–1.2)
ABS. NEUTROPHILS: 5.6 10*3/uL (ref 1.8–8.0)
BASOPHILS: 0 % (ref 0–2)
EOSINOPHILS: 1 % (ref 0–5)
HCT: 40.7 % (ref 35.0–45.0)
HGB: 13.7 g/dL (ref 12.0–16.0)
LYMPHOCYTES: 24 % (ref 21–52)
MCH: 30.2 PG (ref 24.0–34.0)
MCHC: 33.7 g/dL (ref 31.0–37.0)
MCV: 89.6 FL (ref 74.0–97.0)
MONOCYTES: 9 % (ref 3–10)
MPV: 10.4 FL (ref 9.2–11.8)
NEUTROPHILS: 66 % (ref 40–73)
PLATELET: 306 10*3/uL (ref 135–420)
RBC: 4.54 M/uL (ref 4.20–5.30)
RDW: 12.5 % (ref 11.6–14.5)
WBC: 8.5 10*3/uL (ref 4.6–13.2)

## 2016-12-10 LAB — URINALYSIS W/ RFLX MICROSCOPIC
Bilirubin: NEGATIVE
Blood: NEGATIVE
Glucose: NEGATIVE mg/dL
Ketone: NEGATIVE mg/dL
Leukocyte Esterase: NEGATIVE
Nitrites: NEGATIVE
Protein: NEGATIVE mg/dL
Specific gravity: >1.03 — ABNORMAL HIGH (ref 1.005–1.030)
Urobilinogen: 1 EU/dL (ref 0.2–1.0)
pH (UA): 7 (ref 5.0–8.0)

## 2016-12-10 MED ORDER — LIDOCAINE (PF) 10 MG/ML (1 %) IJ SOLN
10 mg/mL (1 %) | Freq: Once | INTRAMUSCULAR | Status: DC
Start: 2016-12-10 — End: 2016-12-10

## 2016-12-10 MED ORDER — LIDOCAINE (PF) 20 MG/ML (2 %) IV SYRINGE
100 mg/5 mL (2 %) | Freq: Once | INTRAVENOUS | Status: AC
Start: 2016-12-10 — End: 2016-12-10
  Administered 2016-12-10: 17:00:00 via INTRAVENOUS

## 2016-12-10 MED ORDER — HYDROCODONE-ACETAMINOPHEN 5 MG-325 MG TAB
5-325 mg | ORAL | Status: AC
Start: 2016-12-10 — End: 2016-12-10
  Administered 2016-12-10: 18:00:00 via ORAL

## 2016-12-10 MED ORDER — KETOROLAC TROMETHAMINE 30 MG/ML INJECTION
30 mg/mL (1 mL) | INTRAMUSCULAR | Status: AC
Start: 2016-12-10 — End: 2016-12-10
  Administered 2016-12-10: 16:00:00 via INTRAVENOUS

## 2016-12-10 MED ORDER — HYDROCODONE-ACETAMINOPHEN 5 MG-325 MG TAB
5-325 mg | ORAL_TABLET | ORAL | 0 refills | Status: DC
Start: 2016-12-10 — End: 2017-08-03

## 2016-12-10 MED FILL — KETOROLAC TROMETHAMINE 30 MG/ML INJECTION: 30 mg/mL (1 mL) | INTRAMUSCULAR | Qty: 1

## 2016-12-10 MED FILL — HYDROCODONE-ACETAMINOPHEN 5 MG-325 MG TAB: 5-325 mg | ORAL | Qty: 2

## 2016-12-10 MED FILL — LIDOCAINE (PF) 20 MG/ML (2 %) IV SYRINGE: 100 mg/5 mL (2 %) | INTRAVENOUS | Qty: 5

## 2016-12-10 NOTE — ED Triage Notes (Signed)
Pt c/o abdominal pain, "its worse than last time."

## 2016-12-10 NOTE — ED Provider Notes (Signed)
EMERGENCY DEPARTMENT HISTORY AND PHYSICAL EXAM    11:37 AM      Date: 12/10/2016  Patient Name: [name]    History of Presenting Illness     Chief Complaint   Patient presents with   ??? Abdominal Pain         History Provided By: Patient    Chief Complaint: Abdominal Pain  Duration:  6 hours  Timing:  Acute  Location: Abdomen  Quality: Sharp  Severity: 10 out of 10  Modifying Factors: Tried tylenol and aleve but neither worked  Associated Symptoms: Nausea, dysuria      Additional History (Context): DONIE MOULTON is a 46 y.o. female with HTN, anxiety, musculoskeletal disorder, ovarian cysts, and pelvic pain presents with acute abdominal pain for the past 6 hours. She said she woke up and could barely stand up because her pain was so bad. The quality is sharp and the severity is a 10 out of 10. She said she has burning while she is urinating and is nauseous.Pateint said that she tried aleve and tylenol but neither worked on the pain. She denies any vaginal bleeding, trouble breathing and vomiting. No other concerns or symptoms at this time.    PCP: None        Past History     Past Medical History:  Past Medical History:   Diagnosis Date   ??? Adult ADHD    ??? Anxiety    ??? Chronic back pain    ??? Hypertension    ??? Musculoskeletal disorder    ??? Ovarian cyst    ??? Pelvic pain        Past Surgical History:  Past Surgical History:   Procedure Laterality Date   ??? HX GYN      hysterectomy   ??? HX GYN  11/05/2011    VAGINAL LACERATION REPAIR   ??? HX HYSTERECTOMY     ??? HX TUBAL LIGATION         Family History:  Family History   Problem Relation Age of Onset   ??? Hypertension Mother    ??? Heart Disease Mother    ??? Cancer Father      died of colon cancer at 56       Social History:  Social History   Substance Use Topics   ??? Smoking status: Never Smoker   ??? Smokeless tobacco: Never Used   ??? Alcohol use No       Allergies:  Allergies   Allergen Reactions   ??? Codeine Unknown (comments)     Pt denies allergy    ??? Codeine Itching    ??? Erythromycin Unknown (comments)   ??? Erythromycin Nausea and Vomiting         Review of Systems     Review of Systems   Constitutional: Negative for diaphoresis and fever.   HENT: Negative for congestion and sore throat.    Eyes: Negative for pain and itching.   Respiratory: Negative for cough and shortness of breath.    Cardiovascular: Negative for chest pain and palpitations.   Gastrointestinal: Positive for abdominal pain and nausea. Negative for diarrhea and vomiting.   Endocrine: Negative for polydipsia and polyuria.   Genitourinary: Positive for dysuria and flank pain (right). Negative for hematuria and vaginal bleeding.   Musculoskeletal: Negative for arthralgias and myalgias.   Skin: Negative for rash and wound.   Neurological: Negative for seizures and syncope.   Hematological: Does not bruise/bleed easily.  Psychiatric/Behavioral: Negative for agitation and hallucinations.         Physical Exam     Patient Vitals for the past 12 hrs:   Temp Pulse Resp BP SpO2   12/10/16 1345 - 74 18 (!) 150/94 99 %   12/10/16 1315 - 73 22 146/90 100 %   12/10/16 1303 - - - (!) 155/95 -   12/10/16 1200 - - - 130/79 99 %   12/10/16 1157 - - - 138/80 -   12/10/16 1101 97.9 ??F (36.6 ??C) 69 15 (!) 151/128 98 %         Physical Exam   Constitutional: She appears well-developed and well-nourished. She appears distressed.   HENT:   Head: Normocephalic and atraumatic.   Eyes: Conjunctivae are normal. No scleral icterus.   Neck: Normal range of motion. Neck supple. No JVD present.   Cardiovascular: Normal rate, regular rhythm and normal heart sounds.    4 intact extremity pulses   Pulmonary/Chest: Effort normal and breath sounds normal.   Abdominal: Soft. She exhibits no mass. There is no tenderness.   Genitourinary:   Genitourinary Comments: Right pubic tenderness is present   Musculoskeletal: Normal range of motion.   Lymphadenopathy:     She has no cervical adenopathy.   Neurological: She is alert.    Skin: Skin is warm and dry.   Nursing note and vitals reviewed.        Diagnostic Study Results   Labs -  Recent Results (from the past 12 hour(s))   CBC WITH AUTOMATED DIFF    Collection Time: 12/10/16 11:30 AM   Result Value Ref Range    WBC 8.5 4.6 - 13.2 K/uL    RBC 4.54 4.20 - 5.30 M/uL    HGB 13.7 12.0 - 16.0 g/dL    HCT 24.4 01.0 - 27.2 %    MCV 89.6 74.0 - 97.0 FL    MCH 30.2 24.0 - 34.0 PG    MCHC 33.7 31.0 - 37.0 g/dL    RDW 53.6 64.4 - 03.4 %    PLATELET 306 135 - 420 K/uL    MPV 10.4 9.2 - 11.8 FL    NEUTROPHILS 66 40 - 73 %    LYMPHOCYTES 24 21 - 52 %    MONOCYTES 9 3 - 10 %    EOSINOPHILS 1 0 - 5 %    BASOPHILS 0 0 - 2 %    ABS. NEUTROPHILS 5.6 1.8 - 8.0 K/UL    ABS. LYMPHOCYTES 2.0 0.9 - 3.6 K/UL    ABS. MONOCYTES 0.7 0.05 - 1.2 K/UL    ABS. EOSINOPHILS 0.1 0.0 - 0.4 K/UL    ABS. BASOPHILS 0.0 0.0 - 0.1 K/UL    DF AUTOMATED     METABOLIC PANEL, BASIC    Collection Time: 12/10/16 11:30 AM   Result Value Ref Range    Sodium 141 136 - 145 mmol/L    Potassium 3.8 3.5 - 5.5 mmol/L    Chloride 107 100 - 108 mmol/L    CO2 26 21 - 32 mmol/L    Anion gap 8 3.0 - 18 mmol/L    Glucose 85 74 - 99 mg/dL    BUN 9 7.0 - 18 MG/DL    Creatinine 7.42 0.6 - 1.3 MG/DL    BUN/Creatinine ratio 13 12 - 20      GFR est AA >60 >60 ml/min/1.52m2    GFR est non-AA >60 >60 ml/min/1.26m2    Calcium  9.3 8.5 - 10.1 MG/DL   URINALYSIS W/ RFLX MICROSCOPIC    Collection Time: 12/10/16 11:45 AM   Result Value Ref Range    Color YELLOW      Appearance CLOUDY      Specific gravity >1.030 (H) 1.005 - 1.030    pH (UA) 7.0 5.0 - 8.0      Protein NEGATIVE  NEG mg/dL    Glucose NEGATIVE  NEG mg/dL    Ketone NEGATIVE  NEG mg/dL    Bilirubin NEGATIVE  NEG      Blood NEGATIVE  NEG      Urobilinogen 1.0 0.2 - 1.0 EU/dL    Nitrites NEGATIVE  NEG      Leukocyte Esterase NEGATIVE  NEG         Radiologic Studies -   [place] PELV NON OBS   Final Result        [place] Pelv Non Obs    Result Date: 12/10/2016   EXAM: PELVIC ULTRASOUND CLINICAL INDICATION/HISTORY: Pelvic pain, negative beta-HCG, suspect gyn etiology -Additional:  None COMPARISON: None TECHNIQUE: Real-time transabdominal and transvaginal sonography in multiple planes of the pelvis was performed with image documentation. Minimal endovaginal imaging performed as patient could not tolerate this technique. Grayscale, color flow Doppler imaging, and velocity spectral waveform analysis of the ovaries was performed (duplex imaging). _______________ FINDINGS: UTERUS: Surgically absent. RIGHT ADNEXA:   > Ovary appears normal. No solid mass. Color and spectral Doppler demonstrate normal flow to the right ovary with no evidence of ovarian torsion. Arterial and venous waveforms are normal.   > No adnexal mass. LEFT ADNEXA:   > Ovary could not be identified. Color and spectral Doppler demonstrate normal flow to the right ovary with no evidence of ovarian torsion. Arterial and venous waveforms are normal.   > No adnexal mass. OTHER: None. _______________     IMPRESSION: Status post hysterectomy. Left ovary could not be visualized. Right ovary appears normal. Patient would not tolerate complete endovaginal exam. There is no clear explanation for pain.       Medications ordered:   Medications   ketorolac (TORADOL) injection 15 mg (15 mg IntraVENous Given 12/10/16 1216)   lidocaine (PF) (XYLOCAINE) 100 mg/5 mL (2 %) injection syringe 100 mg (100 mg IntraVENous Given 12/10/16 1308)   HYDROcodone-acetaminophen (NORCO) 5-325 mg per tablet 2 Tab (2 Tabs Oral Given 12/10/16 1356)         Medical Decision Making   Initial Medical Decision Making and DDx:  Status post hysterectomy, do not suspect ectopic pregnancy, do not suspect STI, most likely pain associated with her reoccuring ovarian cysts.  Do not suspect appy bowel obstruc, perf.  She's had this pain many times.     ED Course: Progress Notes, Reevaluation, and Consults:  ED Course   11:30 AM   Numerous prescriptions for narcotics and in June was suboxone.  None overlapping with today.  2:11 PM  Discussed results and she appears comfortable. Emphasize PCP, GYN, and pain management follow up.    I am the first provider for this patient.    I reviewed the vital signs, available nursing notes, past medical history, past surgical history, family history and social history.    Vital Signs-Reviewed the patient's vital signs.    Pulse Oximetry Analysis - 98% on room air wnl    Cardiac Monitor:  Rate/Rhythm:  69 bpm    Records Reviewed: Nursing Notes (Time of Review: 11:37 AM)      Diagnosis  Clinical Impression:   1. RLQ abdominal pain        Disposition: Discharged    Follow-up Information     Follow up With Details Comments Contact Info    [facility]   [address] [place] [address]  [phone]    Phillis Knack, MD   [address]  [address]  [facility] [place] [address]  [phone]      Bolivar Haw, NP   [address]  [address]  [place] [address]  [phone]             Patient's Medications   Start Taking    HYDROCODONE-ACETAMINOPHEN (NORCO) 5-325 MG PER TABLET    Take 1-2 tablets PO every 4-6 hours as needed for pain control.  If over the counter ibuprofen or acetaminophen was suggested, then only take the vicodin for pain not well controlled with the over the counter medication.   Continue Taking    No medications on file   These Medications have changed    No medications on file   Stop Taking    HYDROCODONE-ACETAMINOPHEN (NORCO) 5-325 MG PER TABLET    Take 1 Tab by mouth every six (6) hours as needed for Pain. Max Daily Amount: 4 Tabs.     _______________________________    Attestations:  Scribe Attestation     Ryan Pentecost acting as a Neurosurgeon for and in the presence of Deborra Medina, MD      December 10, 2016 at 11:37 AM       Provider Attestation:      I personally performed the services described in the documentation,  reviewed the documentation, as recorded by the scribe in my presence, and it accurately and completely records my words and actions. December 10, 2016 at 11:37 AM - Deborra Medina, MD    _______________________________

## 2016-12-10 NOTE — ED Notes (Signed)
I have reviewed discharge instructions with the patient.  The patient verbalized understanding. Patient NAD, VSS.  Patient armband removed and shredded.

## 2016-12-13 ENCOUNTER — Encounter: Attending: Obstetrics & Gynecology | Primary: Nurse Practitioner

## 2016-12-26 IMAGING — US US TRANSVAGINAL NON-OB
1 series · 14 of 25 positions shown · non-contrast
Comparison: None.

CLINICAL DATA: Right pelvic pain for 2 weeks.

EXAM:
TRANSABDOMINAL AND TRANSVAGINAL ULTRASOUND OF PELVIS
DOPPLER ULTRASOUND OF OVARIES
TECHNIQUE: Both transabdominal and transvaginal ultrasound examinations of the
pelvis were performed. Transabdominal technique was performed for
global imaging of the pelvis including uterus, ovaries, adnexal
regions, and pelvic cul-de-sac.
It was necessary to proceed with endovaginal exam following the
transabdominal exam to visualize the endometrium and ovaries. Color
and duplex Doppler ultrasound was utilized to evaluate blood flow to
the ovaries.

[Series 1: us transvaginal non-ob · 0.24mm/px · 48 acquisitions, 14 frames shown]
[im 1/48]
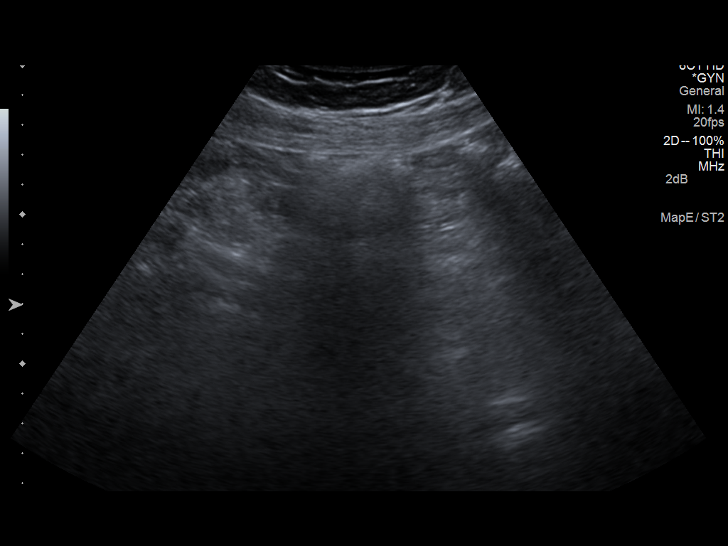
[im 4/48]
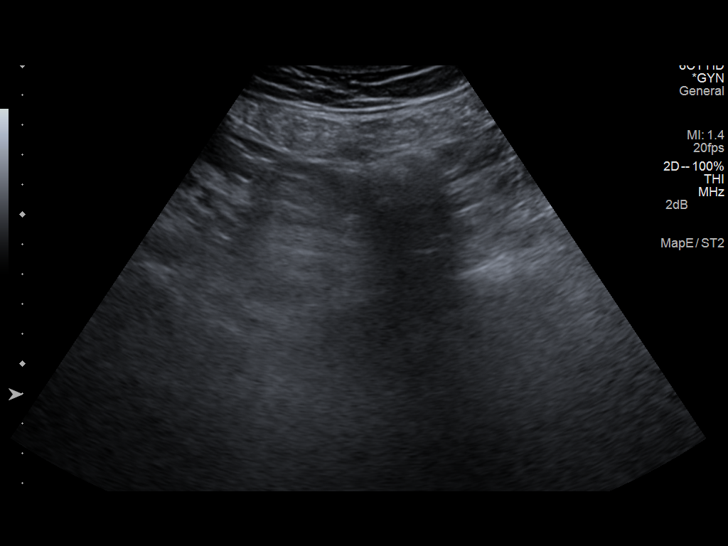
[im 8/48]
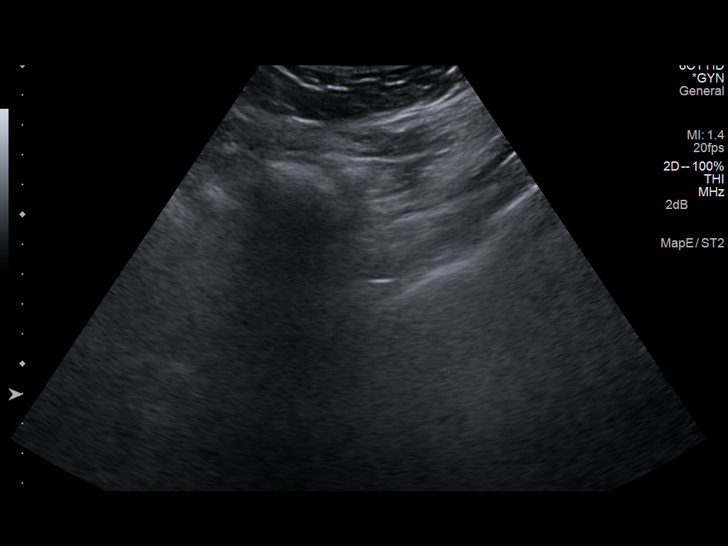
[im 12/48]
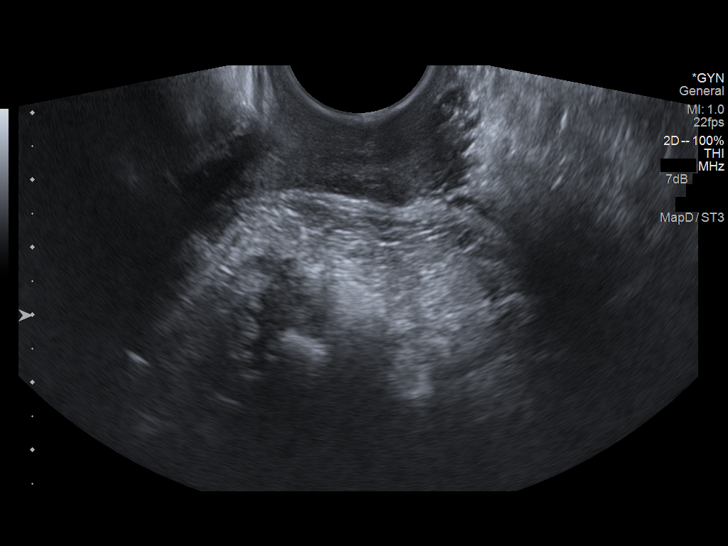
[im 16/48]
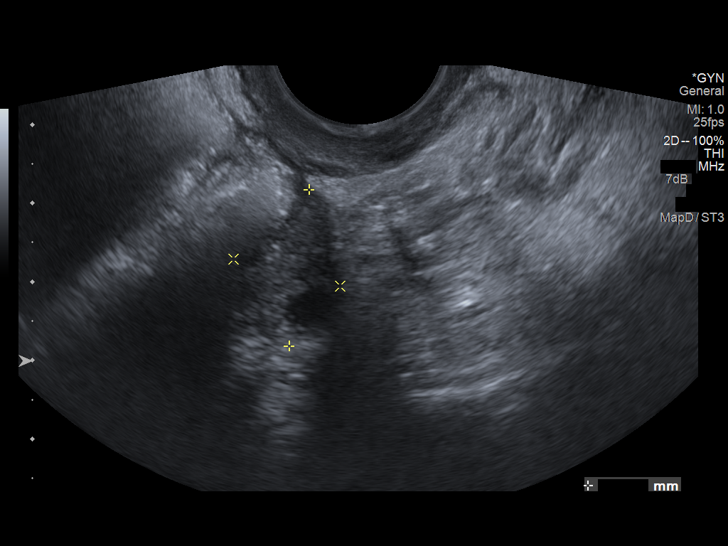
[im 18/48]
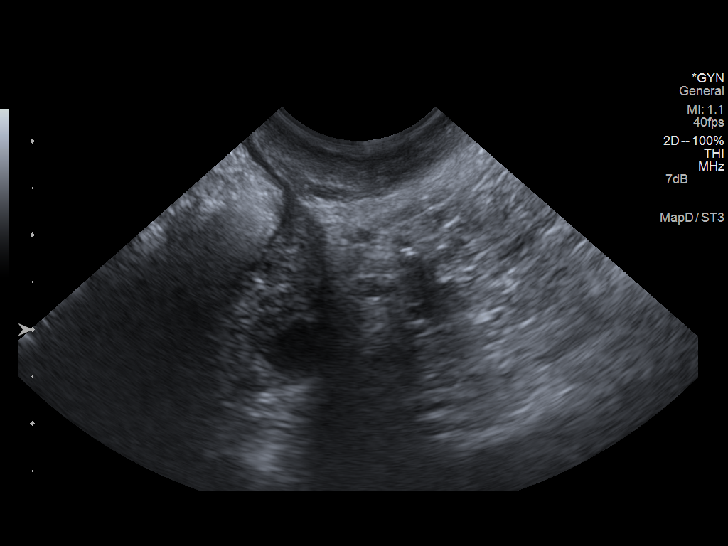
[im 22/48]
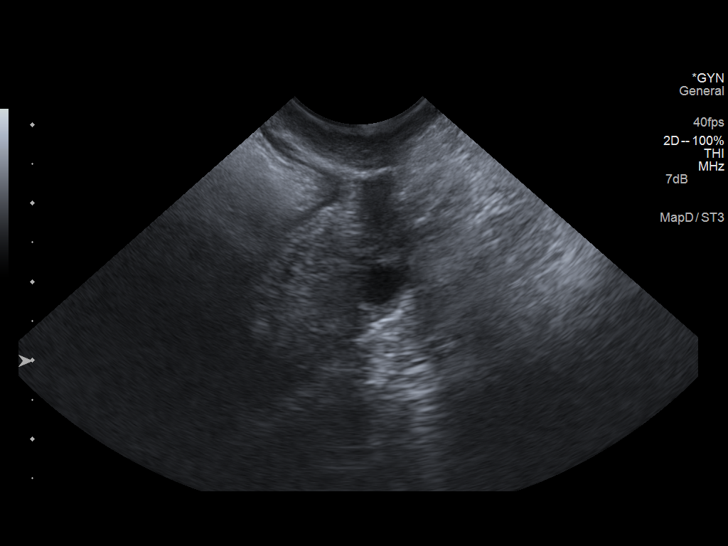
[im 26/48]
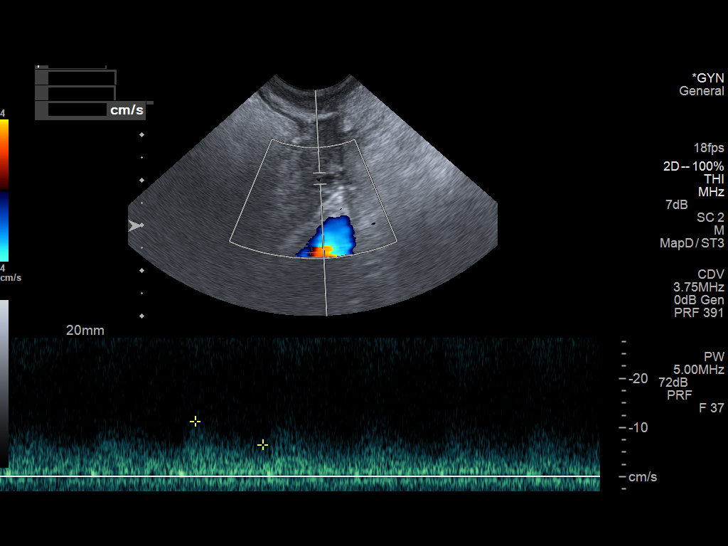
[im 30/48]
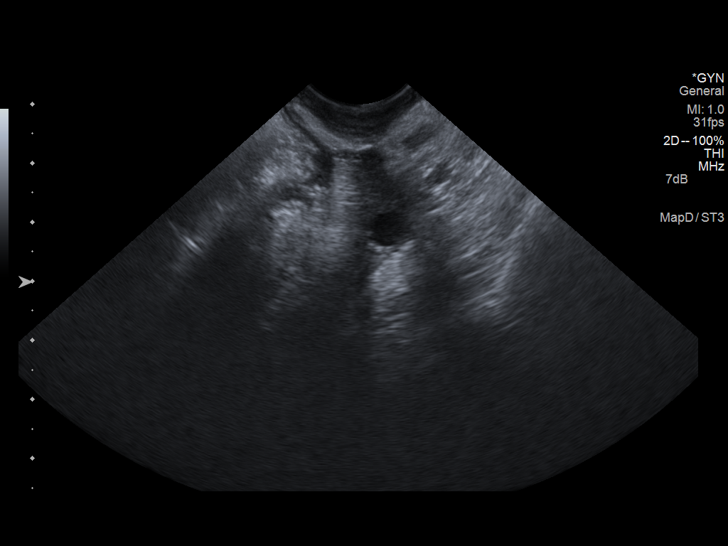
[im 32/48]
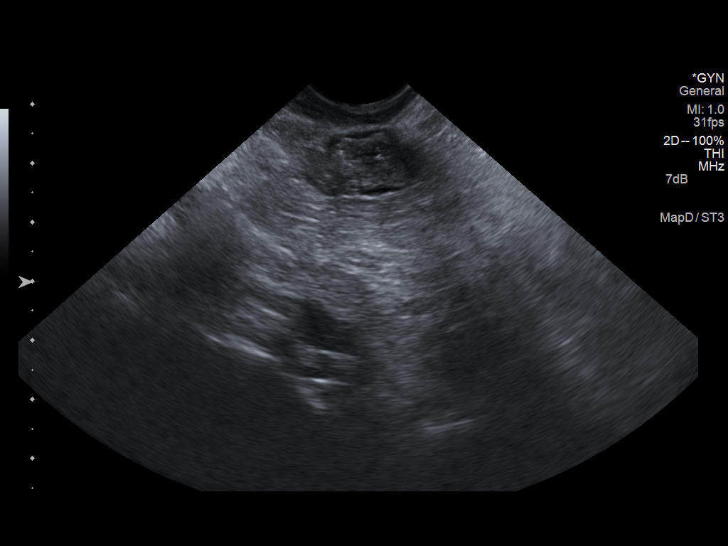
[im 36/48]
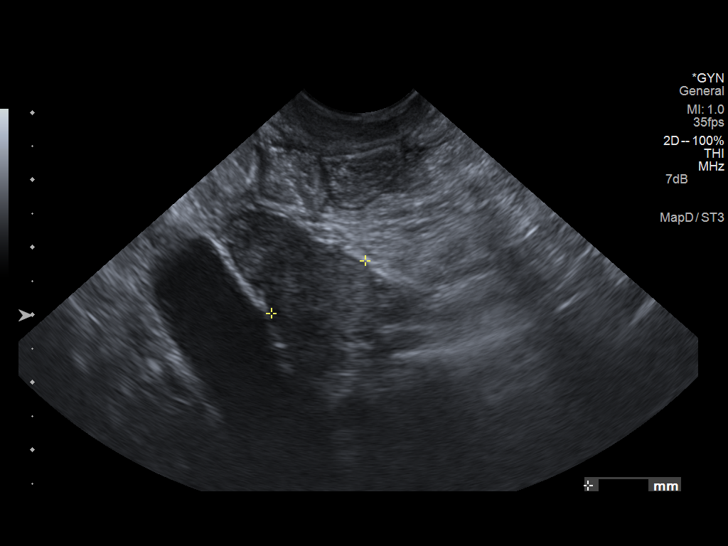
[im 40/48]
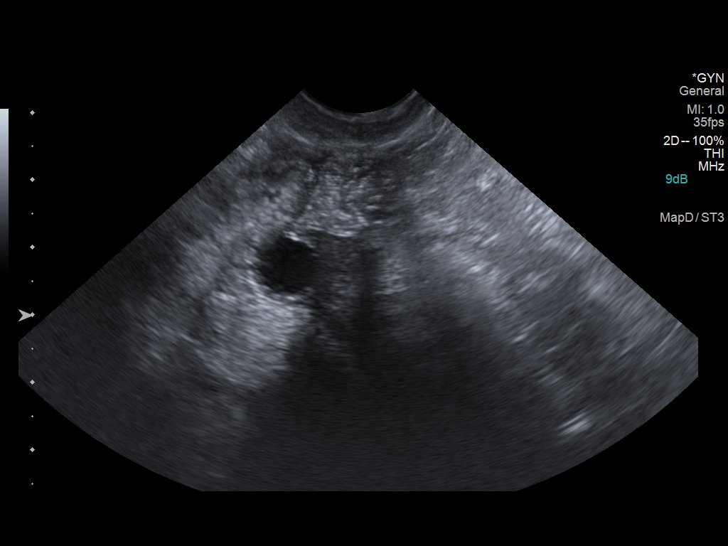
[im 44/48]
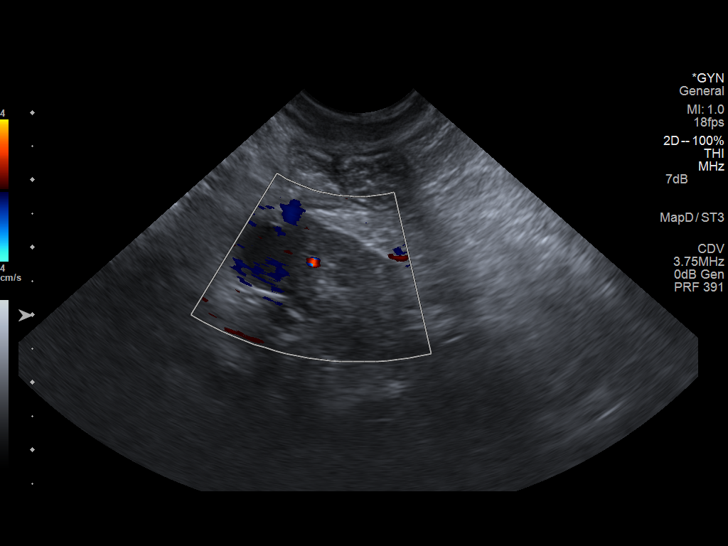
[im 48/48]
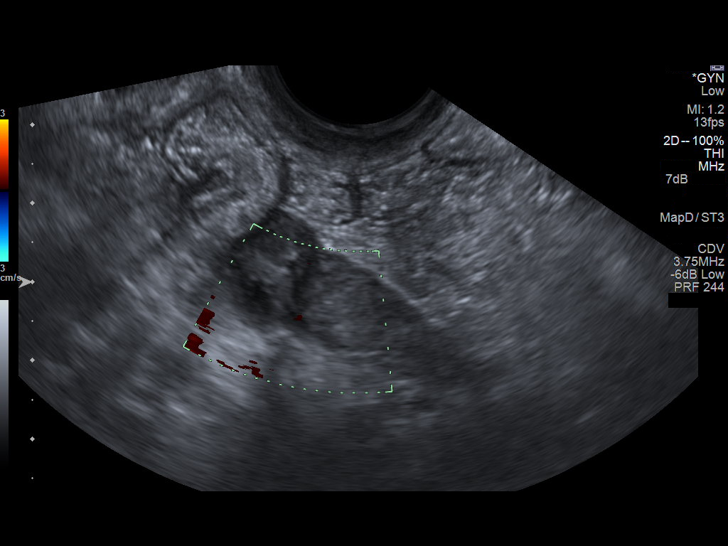

[14 of 25 positions shown; findings below may reference images not displayed]

FINDINGS: Uterus

Surgically absent.

Right ovary

Measurements: 2.2 x 1.6 x 1.6 cm. Normal appearance/no adnexal mass.

Left ovary

Measurements: 2 x 1.4 x 1.4 cm. Normal appearance/no adnexal mass.

Pulsed Doppler evaluation of both ovaries demonstrates normal
low-resistance arterial and venous waveforms. Evaluation of the
right ovary was complicated by adjacent bowel peristalsis.

Other findings

No free fluid.
IMPRESSION: 1. No ovarian torsion.
2. Prior hysterectomy.

## 2016-12-26 IMAGING — CT CT ABD-PELV W/ CM
2 of 5 series · 17 of 46 positions shown, 19 images · IV contrast (Omni 300)
Comparison: None.

CLINICAL DATA: Pelvic/ lower abdominal pain x3 weeks, status post
hysterectomy 9489, with vaginal wall repair in 4518

EXAM:
CT ABDOMEN AND PELVIS WITH CONTRAST
TECHNIQUE: Multidetector CT imaging of the abdomen and pelvis was performed
using the standard protocol following bolus administration of
intravenous contrast.
CONTRAST:  80mL OMNIPAQUE IOHEXOL 300 MG/ML  SOLN

[Series 2: abd/ pelvis 5.0 i30f 1 · axial · 0.73mm/px · z∈[+772,+1228]mm · 14 of 103 slices shown, 16 images]
[im 6/103  soft-tissue]
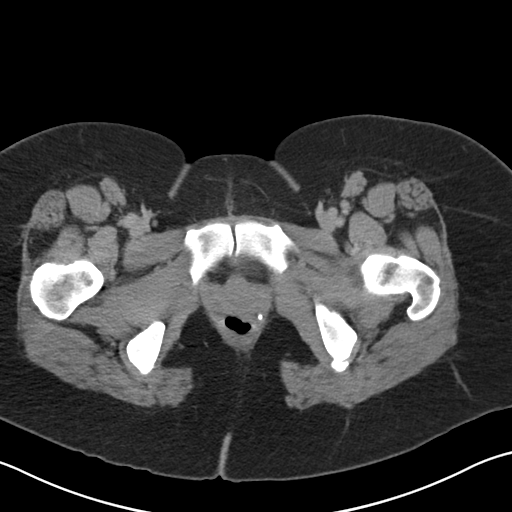
[im 6/103  bone]
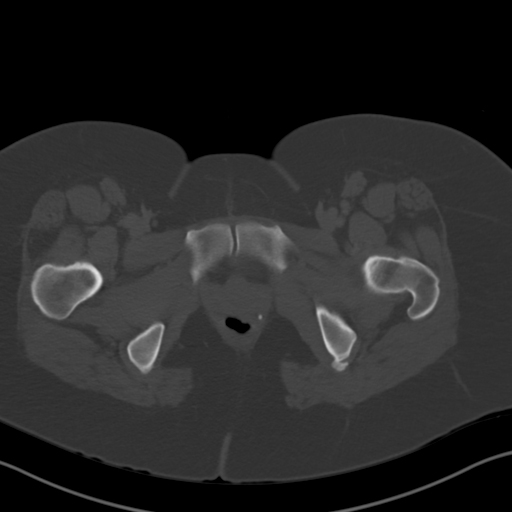
[im 16/103  soft-tissue]
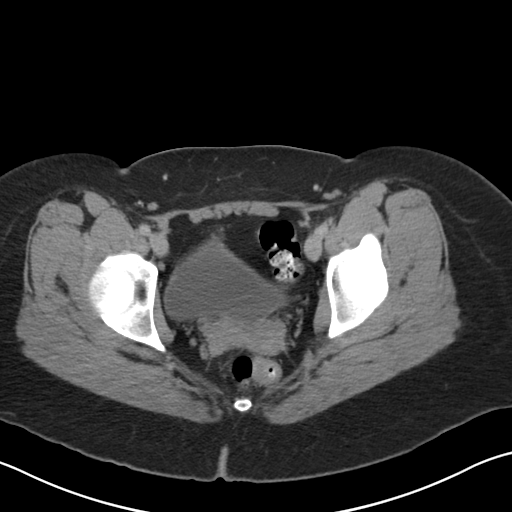
[im 21/103  soft-tissue]
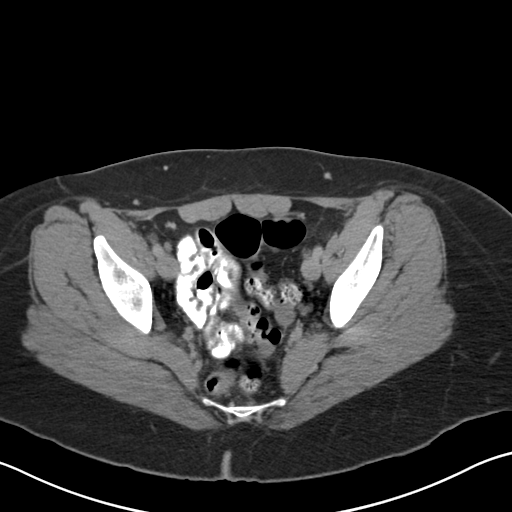
[im 26/103  soft-tissue]
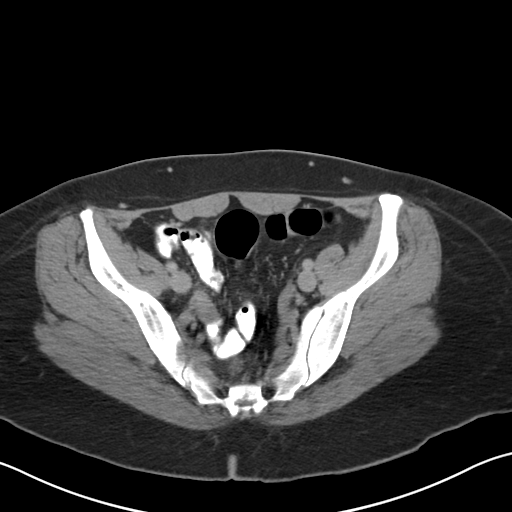
[im 36/103  soft-tissue]
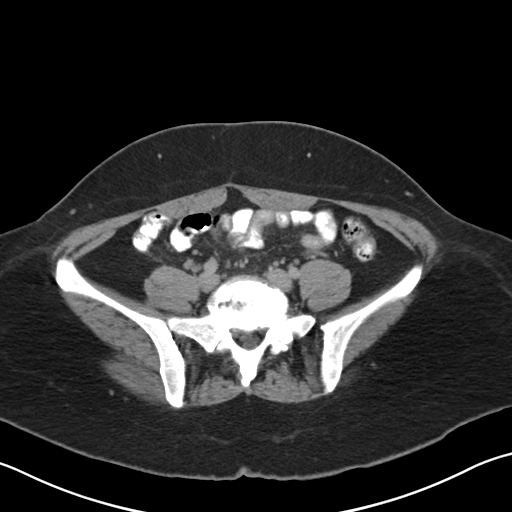
[im 41/103  soft-tissue]
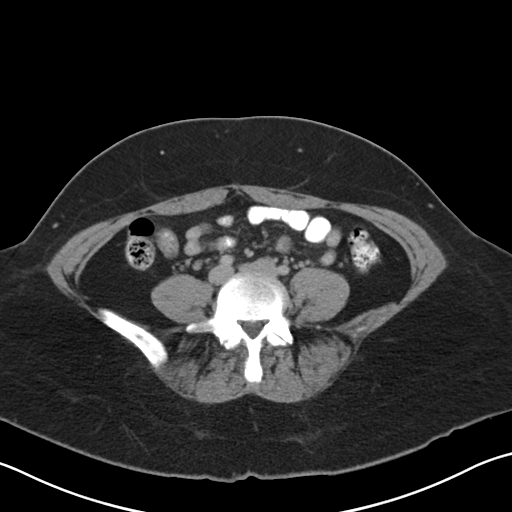
[im 46/103  soft-tissue]
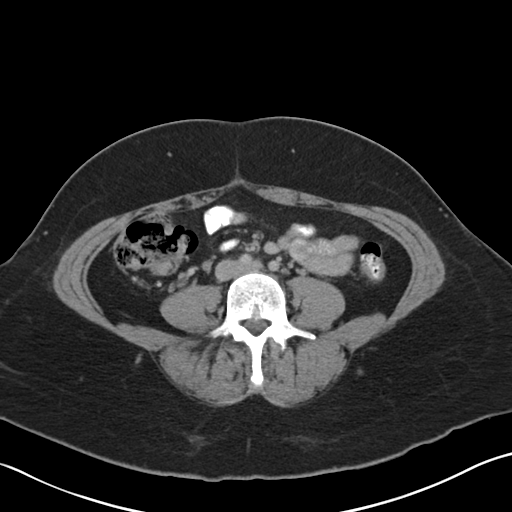
[im 57/103  soft-tissue]
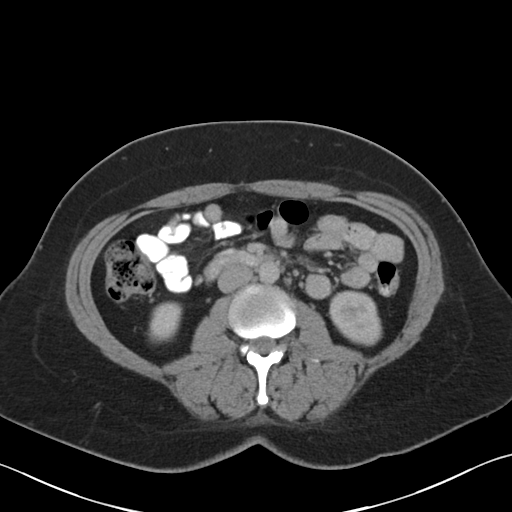
[im 62/103  soft-tissue]
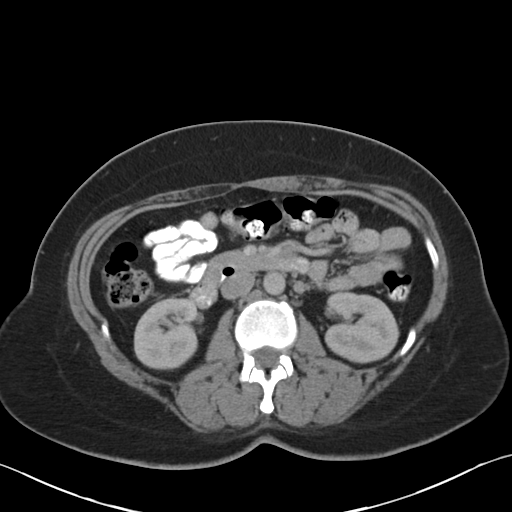
[im 62/103  bone]
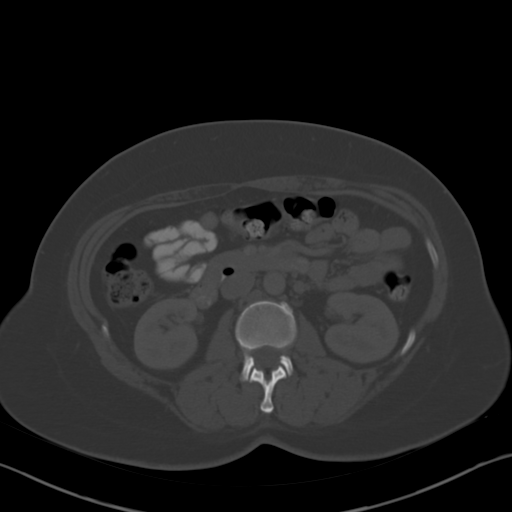
[im 67/103  soft-tissue]
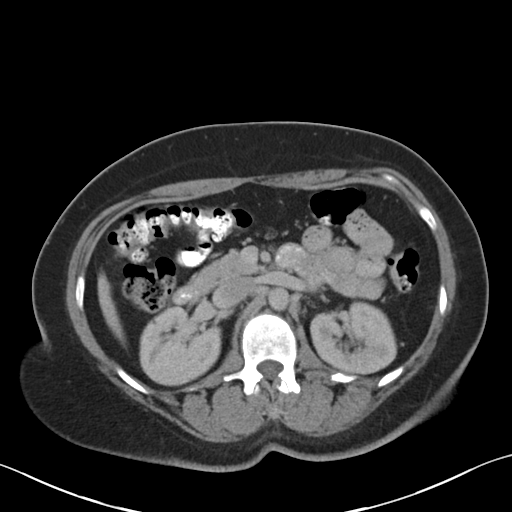
[im 77/103  soft-tissue]
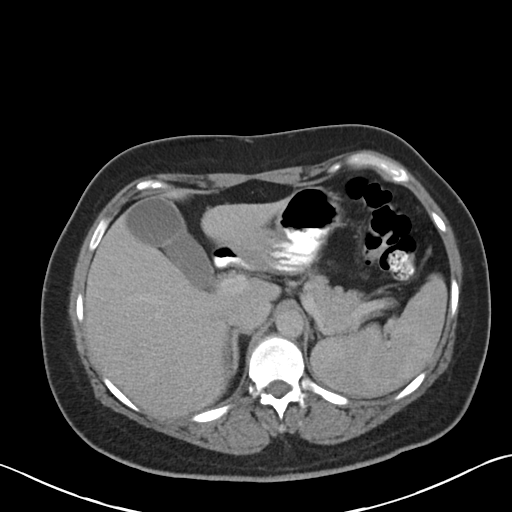
[im 82/103  soft-tissue]
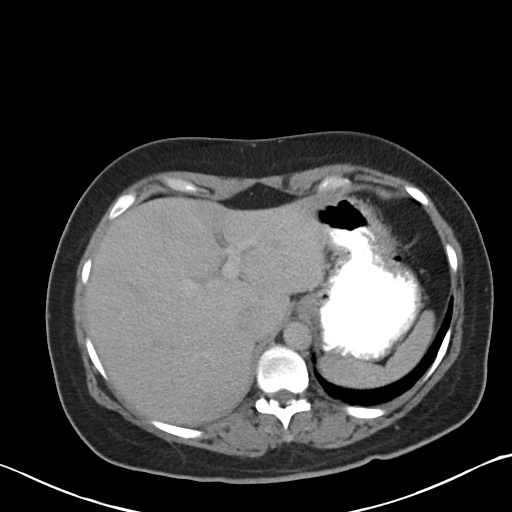
[im 87/103  soft-tissue]
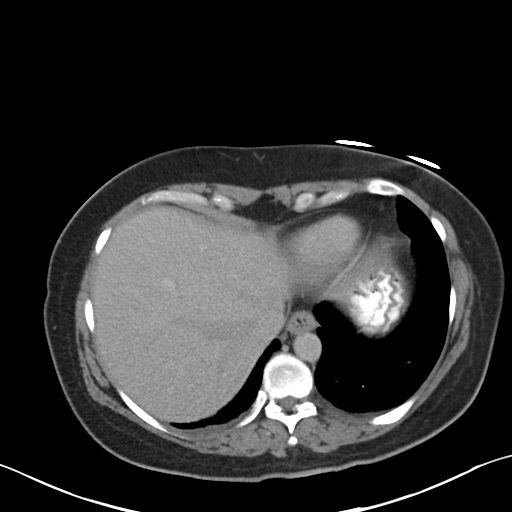
[im 97/103  soft-tissue]
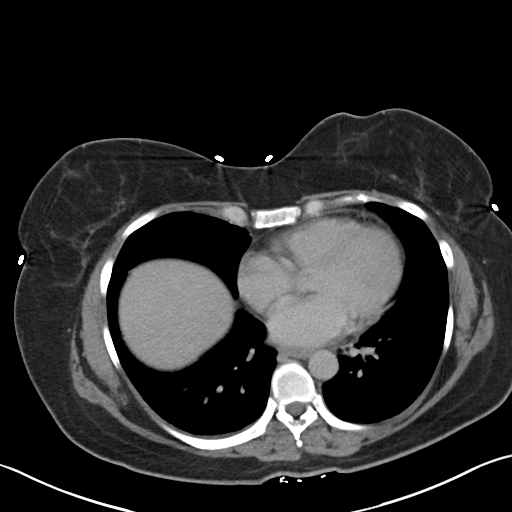

[Series 5: coronals · coronal · 0.89mm/px · 3 of 105 slices shown]
[im 35/105  soft-tissue]
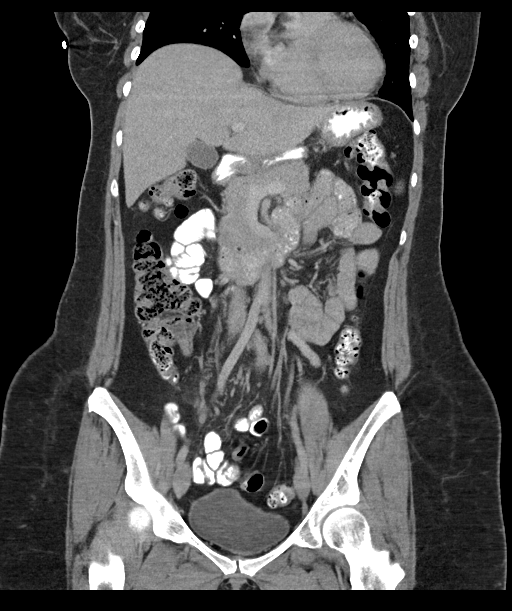
[im 47/105  soft-tissue]
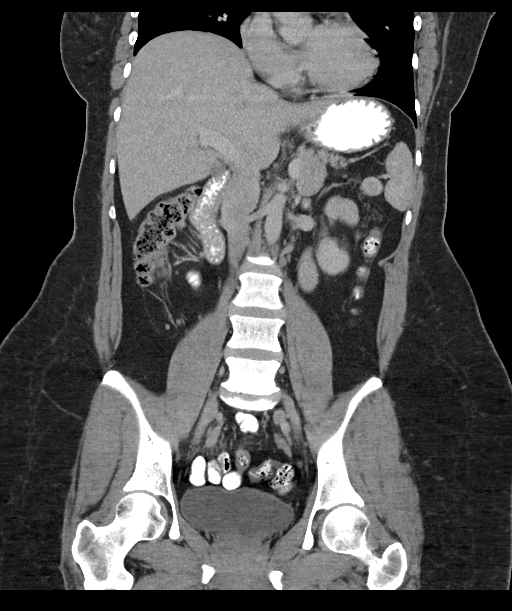
[im 58/105  soft-tissue]
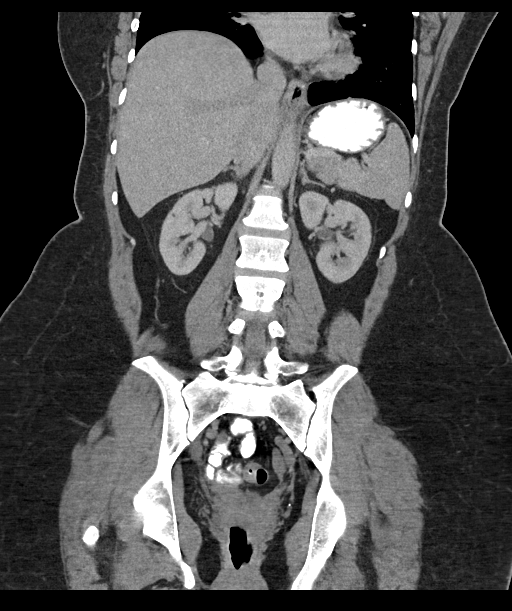

[17 of 46 positions shown; findings below may reference images not displayed]

FINDINGS: Lower chest:  Lung bases are clear.  Trace right pelvic fluid.

Hepatobiliary: Liver is within normal limits.

Gallbladder is unremarkable. No intrahepatic or extrahepatic ductal
dilatation.

Pancreas: Within normal limits.

Spleen: Mildly lobular contour.

Adrenals/Urinary Tract: Adrenal glands are unremarkable.

Kidneys are within normal limits.  No hydronephrosis.

Bladder is within normal limits

Stomach/Bowel: Stomach is unremarkable.

No evidence of bowel obstruction.

Normal appendix.

Vascular/Lymphatic: No evidence of abdominal aortic aneurysm.

No suspicious abdominopelvic lymphadenopathy.

Reproductive: Status post hysterectomy.

Bilateral ovaries are within normal limits.

Other: No abdominopelvic ascites.

Musculoskeletal: Degenerative changes at the L5-S1.
IMPRESSION: No evidence of bowel obstruction.  Normal appendix.

Status post hysterectomy.

No CT findings to account for the patient's abdominal pain.

## 2017-01-07 DIAGNOSIS — R079 Chest pain, unspecified: Secondary | ICD-10-CM

## 2017-01-07 NOTE — ED Triage Notes (Addendum)
Pt stated she had a seizure 2220 tonight. Pt A&Ox4. Stated she has been getting seizures in the last month along w/ c/p. [facility]. Was told to f/u w/ neuro and cardio. 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

## 2017-01-08 ENCOUNTER — Inpatient Hospital Stay
Admit: 2017-01-08 | Discharge: 2017-01-08 | Disposition: A | Discharge status: Home / Self Care | Payer: Self-pay | Attending: Emergency Medicine

## 2017-01-08 LAB — CARDIAC PANEL,(CK, CKMB & TROPONIN)
CK - MB: <1 ng/ml (ref <3.6)
CK: 45 U/L (ref 26–192)
Troponin-I, QT: <0.02 NG/ML (ref 0.0–0.045)

## 2017-01-08 LAB — CBC WITH AUTOMATED DIFF
ABS. BASOPHILS: 0 10*3/uL (ref 0.0–0.1)
ABS. EOSINOPHILS: 0.1 10*3/uL (ref 0.0–0.4)
ABS. LYMPHOCYTES: 2 10*3/uL (ref 0.9–3.6)
ABS. MONOCYTES: 0.6 10*3/uL (ref 0.05–1.2)
ABS. NEUTROPHILS: 3.1 10*3/uL (ref 1.8–8.0)
BASOPHILS: 0 % (ref 0–2)
EOSINOPHILS: 2 % (ref 0–5)
HCT: 43.3 % (ref 35.0–45.0)
HGB: 14.1 g/dL (ref 12.0–16.0)
LYMPHOCYTES: 34 % (ref 21–52)
MCH: 30 PG (ref 24.0–34.0)
MCHC: 32.6 g/dL (ref 31.0–37.0)
MCV: 92.1 FL (ref 74.0–97.0)
MONOCYTES: 10 % (ref 3–10)
MPV: 10.9 FL (ref 9.2–11.8)
NEUTROPHILS: 54 % (ref 40–73)
PLATELET: 275 10*3/uL (ref 135–420)
RBC: 4.7 M/uL (ref 4.20–5.30)
RDW: 12.4 % (ref 11.6–14.5)
WBC: 5.8 10*3/uL (ref 4.6–13.2)

## 2017-01-08 LAB — METABOLIC PANEL, BASIC
Anion gap: 6 mmol/L (ref 3.0–18)
BUN/Creatinine ratio: 15 (ref 12–20)
BUN: 11 MG/DL (ref 7.0–18)
CO2: 29 mmol/L (ref 21–32)
Calcium: 9.2 MG/DL (ref 8.5–10.1)
Chloride: 108 mmol/L (ref 100–108)
Creatinine: 0.74 MG/DL (ref 0.6–1.3)
GFR est AA: >60 mL/min/{1.73_m2} (ref >60)
GFR est non-AA: >60 mL/min/{1.73_m2} (ref >60)
Glucose: 86 mg/dL (ref 74–99)
Potassium: 4.4 mmol/L (ref 3.5–5.5)
Sodium: 143 mmol/L (ref 136–145)

## 2017-01-08 MED ORDER — LORAZEPAM 2 MG/ML IJ SOLN
2 mg/mL | Freq: Once | INTRAMUSCULAR | Status: AC
Start: 2017-01-08 — End: 2017-01-08
  Administered 2017-01-08: 06:00:00 via INTRAVENOUS

## 2017-01-08 MED ORDER — ACETAMINOPHEN 500 MG TAB
500 mg | Freq: Once | ORAL | Status: AC | PRN
Start: 2017-01-08 — End: 2017-01-08
  Administered 2017-01-08: 06:00:00 via ORAL

## 2017-01-08 MED FILL — MAPAP EXTRA STRENGTH 500 MG TABLET: 500 mg | ORAL | Qty: 2

## 2017-01-08 MED FILL — LORAZEPAM 2 MG/ML IJ SOLN: 2 mg/mL | INTRAMUSCULAR | Qty: 1

## 2017-01-08 NOTE — ED Notes (Signed)
I have reviewed discharge instructions with the patient.  The patient verbalized understanding but was not trying to leave without a "[place] [employer]": Charge RN Okey RegalCarol Silk talked to patient and restated that we do not have a free taxi service. Pt refused to sign .

## 2017-01-08 NOTE — ED Notes (Signed)
Patient insisting on being provided a cab ride home.  Patient stating that she has had 3 cab rides in the past 6 months from [facility] ED.  Explained to patient that a cab cannot be provided due reaching the max number of cab vouchers in a year. Patient stating that she wants to talk to a patient advocate because they will get her a cab ride.  Informed patient that the supervisor will not provide a cab ride.  Patient refusing to leave the ER.  Security called to escort patient out of the facility and Nursing supervisor notified

## 2017-01-08 NOTE — ED Provider Notes (Signed)
EMERGENCY DEPARTMENT HISTORY AND PHYSICAL EXAM    12:43 AM      Date: 01/07/2017  Patient Name: [name]    History of Presenting Illness     Chief Complaint   Patient presents with   ??? Seizure   ??? Chest Pain         History Provided By: patient     Chief Complaint: chest pain   Duration: 1 day   Timing: constant, worsening    Location: left-sided   Quality: radiates down left arm   Severity: 10/10   Modifying Factors: has not taken anything for it   Associated Symptoms: increased seizure activity. Denies fevers, chills, abdominal pain, nausea, vomiting, dysuria, hematuria.       Additional History (Context): [name] is a 46 y.o. female presenting to the ED for the evaluation of constant worsening chest pain x 1 day. The pain radiates down into her left arm and she rates her current pain a 10/10 I nseverity. Patient states that she was seen at a [facility] yesterday and had an EKG done and was told to follow up with a Cardiologist. Patient was also recently diagnosed with seizures and states that she was diagnosed with them at [facility]. She was prescribed klonipin and gabapentin which she has been taking regularly but has been having an increase in seizure activity. Denies any recent fall or trauma to the chest. No fevers, chills, abdominal pain, nausea, vomiting, dysuria, hematuria. She has a history of a partial hysterectomy. Denies EtOH, tobacco usage, illicit drug use. No other complaints or concerns at this time.        PCP: None    Current Outpatient Medications   Medication Sig Dispense Refill   ??? HYDROcodone-acetaminophen (NORCO) 5-325 mg per tablet Take 1-2 tablets PO every 4-6 hours as needed for pain control.  If over the counter ibuprofen or acetaminophen was suggested, then only take the vicodin for pain not well controlled with the over the counter medication. 4 Tab 0       Past History     Past Medical History:  Past Medical History:   Diagnosis Date   ??? Adult ADHD    ??? Anxiety     ??? Chronic back pain    ??? Hypertension    ??? Musculoskeletal disorder    ??? Ovarian cyst    ??? Pelvic pain        Past Surgical History:  Past Surgical History:   Procedure Laterality Date   ??? HX GYN      hysterectomy   ??? HX GYN  11/05/2011    VAGINAL LACERATION REPAIR   ??? HX HYSTERECTOMY     ??? HX TUBAL LIGATION         Family History:  Family History   Problem Relation Age of Onset   ??? Hypertension Mother    ??? Heart Disease Mother    ??? Cancer Father         died of colon cancer at 35       Social History:  Social History     Tobacco Use   ??? Smoking status: Never Smoker   ??? Smokeless tobacco: Never Used   Substance Use Topics   ??? Alcohol use: No   ??? Drug use: No       Allergies:  Allergies   Allergen Reactions   ??? Codeine Unknown (comments)     Pt denies allergy    ??? Codeine Itching   ???  Erythromycin Unknown (comments)   ??? Erythromycin Nausea and Vomiting         Review of Systems     Review of Systems   Constitutional: Negative for chills and fever.   Respiratory: Positive for shortness of breath.    Cardiovascular: Positive for chest pain.   Gastrointestinal: Negative for abdominal pain, nausea and vomiting.   Genitourinary: Negative for dysuria and hematuria.   Neurological: Positive for seizures.   All other systems reviewed and are negative.             Visit Vitals  BP 127/90 (BP 1 Location: Right arm, BP Patient Position: At rest)   Pulse 91   Temp 98.1 ??F (36.7 ??C)   Resp 17   SpO2 98%              Physical Exam / Medical Decision Making   I am the first provider for this patient.    I reviewed the vital signs, available nursing notes, past medical history, past surgical history, family history and social history.    Vital Signs-Reviewed the patient's vital signs.    Physical exam:  General:  Well-developed, well-nourished, emotionally distressed  Head:  Normocephalic atraumatic.    Eyes:  Pupils midrange extraocular movements intact.  No pallor or conjunctival injection.     Nose:  No rhinorrhea, inspection grossly normal.    Ears:  Grossly normal to inspection, no discharge.    Mouth:  Mucous membranes moist, no appreciable intraoral lesion.    Neck/Back:  Trachea midline, no asymmetry.    Chest:  Grossly normal inspection, symmetric chest rise.    Pulmonary:  Clear to auscultation bilaterally no wheezes rhonchi or rales.    Cardiovascular:  S1-S2 no murmurs rubs or gallops.    Abdomen: Soft, nontender, nondistended no guarding rebound or peritoneal signs.    Extremities:  Grossly normal to inspection, peripheral pulses intact  no pain on palpation of calves  Neurologic:  Alert and oriented no appreciable focal neurologic deficit    Skin:  Warm and dry  Psychiatric:  Grossly normal mood and affect  Nursing note reviewed, vital signs reviewed.    ED course:  Patient presents with chest pain center of the chest and persistent since she was evaluated at [facility].  Also describes what sounds like pseudoseizures she fully awake for these episodes where she has shaking and she is calling out to her fianc??.  She has had increasing frequency of these episodes on review the EMR she had a cardiac evaluation was unremarkable she had an EEG that was unremarkable    She reported not using any drugs, on her prior drug screen earlier this month she was positive for amphetamines and cocaine    EKG done at 2220 hrs. 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  Had discussion with the patient about the possible etiologies of chest pain.    Heart score: 2    Patient was felt to be low risk for major adverse cardiac event, was   informed about the risks and benefits and alternatives of inpatient versus outpatient workup.  At this time patient is  stable for outpatient management including follow-up with primary care physician/cardiology for reevaluation within 72 hours.  Patient is aware that no evaluation in the emergency department can ensure 0% risk, patient will return to the emergency department with any concerns.    Disposition:    Discharged home      Portions of this chart were created with Dragon medical speech to text program.   Unrecognized errors may be present.              Diagnostic Study Results     Labs -  Recent Results (from the past 12 hour(s))   EKG, 12 LEAD, INITIAL    Collection Time: 01/07/17 10:20 PM   Result Value Ref Range    Ventricular Rate 85 BPM    Atrial Rate 85 BPM    P-R Interval 154 ms    QRS Duration 82 ms    Q-T Interval 370 ms    QTC Calculation (Bezet) 440 ms    Calculated P Axis 47 degrees    Calculated R Axis -10 degrees    Calculated T Axis 38 degrees    Diagnosis       Normal sinus rhythm  Normal ECG  When compared with ECG of 28-Jul-2015 12:29,  T wave inversion no longer evident in Inferior leads  Nonspecific T wave abnormality, improved in Anterolateral leads     CBC WITH AUTOMATED DIFF    Collection Time: 01/08/17 12:29 AM   Result Value Ref Range    WBC 5.8 4.6 - 13.2 K/uL    RBC 4.70 4.20 - 5.30 M/uL    HGB 14.1 12.0 - 16.0 g/dL    HCT 16.1 09.6 - 04.5 %    MCV 92.1 74.0 - 97.0 FL    MCH 30.0 24.0 - 34.0 PG    MCHC 32.6 31.0 - 37.0 g/dL    RDW 40.9 81.1 - 91.4 %    PLATELET 275 135 - 420 K/uL    MPV 10.9 9.2 - 11.8 FL    NEUTROPHILS 54 40 - 73 %    LYMPHOCYTES 34 21 - 52 %    MONOCYTES 10 3 - 10 %    EOSINOPHILS 2 0 - 5 %    BASOPHILS 0 0 - 2 %    ABS. NEUTROPHILS 3.1 1.8 - 8.0 K/UL    ABS. LYMPHOCYTES 2.0 0.9 - 3.6 K/UL    ABS. MONOCYTES 0.6 0.05 - 1.2 K/UL    ABS. EOSINOPHILS 0.1 0.0 - 0.4 K/UL    ABS. BASOPHILS 0.0 0.0 - 0.1 K/UL    DF AUTOMATED     METABOLIC PANEL, BASIC     Collection Time: 01/08/17 12:29 AM   Result Value Ref Range    Sodium 143 136 - 145 mmol/L    Potassium 4.4 3.5 - 5.5 mmol/L    Chloride 108 100 - 108 mmol/L    CO2 29 21 - 32 mmol/L    Anion gap 6 3.0 - 18 mmol/L    Glucose 86 74 - 99 mg/dL    BUN 11 7.0 - 18 MG/DL    Creatinine 7.82 0.6 - 1.3 MG/DL    BUN/Creatinine ratio 15 12 - 20      GFR est AA >60 >60 ml/min/1.24m2    GFR est non-AA >60 >60 ml/min/1.19m2    Calcium 9.2 8.5 - 10.1 MG/DL   CARDIAC PANEL,(CK, CKMB & TROPONIN)    Collection Time: 01/08/17 12:29 AM   Result Value Ref Range  CK 45 26 - 192 U/L    CK - MB <1.0 <3.6 ng/ml    CK-MB Index  0.0 - 4.0 %     CALCULATION NOT PERFORMED WHEN RESULT IS BELOW LINEAR LIMIT    Troponin-I, Qt. <0.02 0.0 - 0.045 NG/ML       Radiologic Studies -   No orders to display       Diagnosis     Clinical Impression:   1. Acute chest pain    2. Episode of shaking          Follow-up Information     Follow up With Specialties Details Why Contact Info    Seutter, Brion Alimentyan N, MD Cardiology Call in 2 days  [address]  [address]  [facility]  [place] [id number]  [phone]      Pricilla Larssonedding, Anne, MD Sleep Medicine, Neurology Call in 2 days  [address]  [address]  [place] [id number]  [phone]      [facility] EMERGENCY DEPT Emergency Medicine  As needed, If symptoms worsen [address]  [place] [id number]  [phone]              Medication List      ASK your doctor about these medications    HYDROcodone-acetaminophen 5-325 mg per tablet  Commonly known as:  NORCO  Take 1-2 tablets PO every 4-6 hours as needed for pain control.  If over the counter ibuprofen or acetaminophen was suggested, then only take the vicodin for pain not well controlled with the over the counter medication.          _______________________________    Attestations:  Scribe Attestation     Wandra Scotonald Baldomero acting as a scribe for and in the presence of Despina Hickraig M Lorenda Grecco, MD      January 08, 2017 at 12:43 AM        Provider Attestation:      I personally performed the services described in the documentation, reviewed the documentation, as recorded by the scribe in my presence, and it accurately and completely records my words and actions. January 08, 2017 at 12:43 AM - Despina Hickraig M Hagan Maltz, MD  _______________________________

## 2017-01-09 LAB — EKG 12-LEAD
Atrial Rate: 85 {beats}/min
Diagnosis: NORMAL
P Axis: 47 degrees
P-R Interval: 154 ms
Q-T Interval: 370 ms
QRS Duration: 82 ms
QTc Calculation (Bazett): 440 ms
R Axis: -10 degrees
T Axis: 38 degrees
Ventricular Rate: 85 {beats}/min

## 2017-01-09 LAB — EKG, 12 LEAD, INITIAL
Atrial Rate: 85 {beats}/min
Calculated P Axis: 47 degrees
Calculated R Axis: -10 degrees
Calculated T Axis: 38 degrees
Diagnosis: NORMAL
P-R Interval: 154 ms
Q-T Interval: 370 ms
QRS Duration: 82 ms
QTC Calculation (Bezet): 440 ms
Ventricular Rate: 85 {beats}/min

## 2017-02-17 ENCOUNTER — Encounter: Attending: Physician Assistant | Primary: Nurse Practitioner

## 2017-02-18 ENCOUNTER — Encounter: Attending: Internal Medicine | Primary: Nurse Practitioner

## 2017-07-27 ENCOUNTER — Encounter: Attending: Nurse Practitioner | Primary: Nurse Practitioner

## 2017-08-03 ENCOUNTER — Encounter

## 2017-08-03 ENCOUNTER — Inpatient Hospital Stay: Admit: 2017-08-03 | Payer: MEDICAID | Primary: Nurse Practitioner

## 2017-08-03 ENCOUNTER — Ambulatory Visit
Admit: 2017-08-03 | Discharge: 2017-08-03 | Payer: PRIVATE HEALTH INSURANCE | Attending: Nurse Practitioner | Primary: Nurse Practitioner

## 2017-08-03 DIAGNOSIS — I10 Essential (primary) hypertension: Secondary | ICD-10-CM

## 2017-08-03 DIAGNOSIS — Z1329 Encounter for screening for other suspected endocrine disorder: Secondary | ICD-10-CM

## 2017-08-03 LAB — CBC W/O DIFF
HCT: 43.4 % (ref 35.0–45.0)
HGB: 14.2 g/dL (ref 12.0–16.0)
MCH: 30 PG (ref 24.0–34.0)
MCHC: 32.7 g/dL (ref 31.0–37.0)
MCV: 91.6 FL (ref 74.0–97.0)
MPV: 10.9 FL (ref 9.2–11.8)
PLATELET: 343 10*3/uL (ref 135–420)
RBC: 4.74 M/uL (ref 4.20–5.30)
RDW: 13.2 % (ref 11.6–14.5)
WBC: 11.2 10*3/uL (ref 4.6–13.2)

## 2017-08-03 LAB — URINALYSIS W/MICROSCOPIC
Bacteria: NEGATIVE /hpf
Bilirubin: NEGATIVE
Blood: NEGATIVE
Glucose: NEGATIVE mg/dL
Ketone: NEGATIVE mg/dL
Leukocyte Esterase: NEGATIVE
Nitrites: NEGATIVE
Protein: NEGATIVE mg/dL
RBC: 0 /hpf (ref 0–5)
Specific gravity: 1.02 (ref 1.005–1.030)
Urobilinogen: 0.2 EU/dL (ref 0.2–1.0)
WBC: 0 /hpf (ref 0–4)
pH (UA): 6.5 (ref 5.0–8.0)

## 2017-08-03 MED ORDER — GABAPENTIN 400 MG CAP
400 mg | ORAL_CAPSULE | Freq: Three times a day (TID) | ORAL | 1 refills | Status: DC
Start: 2017-08-03 — End: 2017-10-03

## 2017-08-03 MED ORDER — CLONAZEPAM 1 MG TAB
1 mg | ORAL_TABLET | Freq: Every day | ORAL | 0 refills | Status: AC | PRN
Start: 2017-08-03 — End: ?

## 2017-08-03 MED ORDER — NALOXONE 4 MG/ACTUATION NASAL SPRAY
4 mg/actuation | NASAL | 0 refills | Status: AC
Start: 2017-08-03 — End: ?

## 2017-08-03 MED ORDER — ZOLPIDEM 5 MG TAB
5 mg | ORAL_TABLET | Freq: Every evening | ORAL | 1 refills | Status: AC | PRN
Start: 2017-08-03 — End: ?
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
## 2017-08-03 NOTE — Progress Notes (Signed)
Pt in for visit. Labs drawn. Venipuncture done in left AC. Blood specimen obtained. Pt tolerated well. No comps.

## 2017-08-03 NOTE — Progress Notes (Signed)
Pt in for visit. Labs drawn. Venipuncture done in left AC. Blood specimen obtained. Pt tolerated well. No comps.

## 2017-08-03 NOTE — Patient Instructions (Signed)
Psych/Behavioral Health     [facility]  (psychiatry-medications)  [address], [place], [place]   Phone: [phone]    [facility]  [address], [place], [place] [address]  Phone: [phone]    [facility]  [address] [place], [place]  Phone: [phone]  Fax: [phone]
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
## 2017-08-04 LAB — HEPATITIS C ANTIBODY
HCV Ab: 0.02 Index (ref <0.80)
Hepatitis C Ab: NEGATIVE

## 2017-08-04 LAB — HIV 1/2 ANTIGEN/ANTIBODY, FOURTH GENERATION W/RFL: Interpretation: NONREACTIVE

## 2017-08-04 LAB — METABOLIC PANEL, COMPREHENSIVE
A-G Ratio: 1.3 (ref 0.8–1.7)
ALT (SGPT): 28 U/L (ref 13–56)
AST (SGOT): 12 U/L — ABNORMAL LOW (ref 15–37)
Albumin: 4.8 g/dL (ref 3.4–5.0)
Alk. phosphatase: 97 U/L (ref 45–117)
Anion gap: 7 mmol/L (ref 3.0–18)
BUN/Creatinine ratio: 19 (ref 12–20)
BUN: 15 MG/DL (ref 7.0–18)
Bilirubin, total: 0.4 MG/DL (ref 0.2–1.0)
CO2: 26 mmol/L (ref 21–32)
Calcium: 9.3 MG/DL (ref 8.5–10.1)
Chloride: 105 mmol/L (ref 100–108)
Creatinine: 0.79 MG/DL (ref 0.6–1.3)
GFR est AA: >60 mL/min/{1.73_m2} (ref >60)
GFR est non-AA: >60 mL/min/{1.73_m2} (ref >60)
Globulin: 3.7 g/dL (ref 2.0–4.0)
Glucose: 89 mg/dL (ref 74–99)
Potassium: 4 mmol/L (ref 3.5–5.5)
Protein, total: 8.5 g/dL — ABNORMAL HIGH (ref 6.4–8.2)
Sodium: 138 mmol/L (ref 136–145)

## 2017-08-04 LAB — HEP B SURFACE AG
Hep B surface Ag Interp.: NEGATIVE
Hepatitis B surface Ag: <0.1 Index (ref <1.00)

## 2017-08-04 LAB — LIPID PANEL
CHOL/HDL Ratio: 3.6 (ref 0–5.0)
Cholesterol, total: 209 MG/DL — ABNORMAL HIGH (ref <200)
HDL Cholesterol: 58 MG/DL (ref 40–60)
LDL, calculated: 116.2 MG/DL — ABNORMAL HIGH (ref 0–100)
Triglyceride: 174 MG/DL — ABNORMAL HIGH (ref <150)
VLDL, calculated: 34.8 MG/DL

## 2017-08-04 LAB — T PALLIDUM AB: T. pallidum interpretation.: NONREACTIVE

## 2017-08-04 LAB — HEPATITIS C AB
Hep C virus Ab Interp.: NEGATIVE
Hepatitis C virus Ab: 0.02 Index (ref <0.80)

## 2017-08-04 LAB — TSH 3RD GENERATION: TSH: 0.81 u[IU]/mL (ref 0.36–3.74)

## 2017-08-04 LAB — HIV 1/2 AG/AB, 4TH GENERATION,W RFLX CONFIRM: HIV 1/2 Interpretation: NONREACTIVE

## 2017-08-04 LAB — VITAMIN D, 25 HYDROXY: Vitamin D 25-Hydroxy: 17.3 ng/mL — ABNORMAL LOW (ref 30–100)

## 2017-08-05 NOTE — Telephone Encounter (Signed)
Patient called to see if Lattie Haw can prescribe her aderrall until she is able to be seen by the psychiatrist. No appointment scheduled at this time. She states she needs to study over the weekend. Please advise.

## 2017-08-05 NOTE — Telephone Encounter (Signed)
Call made to both numbers and no answer. Voicemail is not set up for neither phone. Not able to leave a message.

## 2017-08-05 NOTE — Telephone Encounter (Signed)
08/05/2017  10:26 AM    Chief Complaint   Patient presents with   ??? Request For New Medication     Adderall       Noted patient request to be started on Adderall while waiting to see psychiatry. Unfortunately, this is not something I can do at this time. Referral pending to [facility].       [facility]  [address]  [place], [place] [address]  [phone] phone

## 2017-08-09 LAB — COMPLIANCE DRUG SCREEN/PRESCRIPTION MONITORING

## 2017-08-11 MED ORDER — CHOLECALCIFEROL (VITAMIN D3) 50,000 UNIT CAPSULE
ORAL_CAPSULE | ORAL | 0 refills | Status: AC
Start: 2017-08-11 — End: 2017-10-28

## 2017-08-16 NOTE — Telephone Encounter (Signed)
Patient called requesting to speak to nurse regarding Klonopin. She states she can't take it the way it's prescribed. She's used to taking it 3x day and Jerrica cut it back to 1x a day.

## 2017-08-17 NOTE — Telephone Encounter (Signed)
08/17/2017  3:51 PM    Chief Complaint   Patient presents with   ??? Medication Problem       Noted patient message about Klonopin. This is all the more Klonopin I will be writing for. She needs to see psychiatry.

## 2017-08-17 NOTE — Telephone Encounter (Signed)
Call made to pt and 2 identifiers used. Pt made aware that once a dosing was all that will be prescribed for the Klonopin. She needs to be seen by her Psychiatrists. Pt verbalized an understanding.

## 2017-10-03 ENCOUNTER — Encounter

## 2017-10-03 MED ORDER — GABAPENTIN 400 MG CAP
400 mg | ORAL_CAPSULE | Freq: Three times a day (TID) | ORAL | 1 refills | Status: DC
Start: 2017-10-03 — End: 2017-10-06

## 2017-10-03 NOTE — Telephone Encounter (Signed)
Requested Prescriptions     Pending Prescriptions Disp Refills   ??? gabapentin (NEURONTIN) 400 mg capsule 180 Cap 1     Sig: Take 2 Caps by mouth three (3) times daily.     Please call [phone] when this is ready to be picked up

## 2017-10-06 ENCOUNTER — Encounter

## 2017-10-06 MED ORDER — GABAPENTIN 400 MG CAP
400 mg | ORAL_CAPSULE | Freq: Three times a day (TID) | ORAL | 1 refills | Status: DC
Start: 2017-10-06 — End: 2017-10-12

## 2017-10-06 NOTE — Telephone Encounter (Signed)
Patient in office for re print of gabapentin rx due to printer malfunction.Please reprint RX.Patient is currently in lobby

## 2017-10-07 NOTE — Telephone Encounter (Signed)
Call made to pt and not able to reach with numbers provided. Will fax Neurontin script to pharmacy on file which [facility].

## 2017-10-11 ENCOUNTER — Encounter: Attending: Nurse Practitioner | Primary: Nurse Practitioner

## 2017-10-12 ENCOUNTER — Ambulatory Visit: Attending: Nurse Practitioner | Primary: Nurse Practitioner

## 2017-10-12 ENCOUNTER — Encounter: Attending: Nurse Practitioner | Primary: Nurse Practitioner

## 2017-10-12 ENCOUNTER — Ambulatory Visit
Admit: 2017-10-12 | Discharge: 2017-10-12 | Payer: PRIVATE HEALTH INSURANCE | Attending: Nurse Practitioner | Primary: Nurse Practitioner

## 2017-10-12 DIAGNOSIS — I10 Essential (primary) hypertension: Secondary | ICD-10-CM

## 2017-10-12 LAB — AMB POC URINE PREGNANCY TEST, VISUAL COLOR COMPARISON
HCG urine, Ql. (POC): NEGATIVE
HCG, Pregnancy, Urine, POC: NEGATIVE

## 2017-10-12 LAB — AMB POC URINALYSIS DIP STICK AUTO W/O MICRO
Bilirubin (UA POC): NEGATIVE
Bilirubin, Urine, POC: NEGATIVE
Blood (UA POC): NEGATIVE
Blood (UA POC): NEGATIVE
Glucose (UA POC): NEGATIVE
Glucose, Urine, POC: NEGATIVE
Ketones (UA POC): NEGATIVE
Ketones, Urine, POC: NEGATIVE
Leukocyte Esterase, Urine, POC: NEGATIVE
Leukocyte esterase (UA POC): NEGATIVE
Nitrite, Urine, POC: NEGATIVE
Nitrites (UA POC): NEGATIVE
Protein (UA POC): NEGATIVE
Protein, Urine, POC: NEGATIVE
Specific Gravity, Urine, POC: 1.02 NA (ref 1.001–1.035)
Specific gravity (UA POC): 1.02 (ref 1.001–1.035)
Urobilinogen (UA POC): 0.2 (ref 0.2–1)
Urobilinogen, POC: 0.2 (ref 0.2–1)
pH (UA POC): 7 (ref 4.6–8.0)
pH, Urine, POC: 7 NA (ref 4.6–8.0)

## 2017-10-12 MED ORDER — GABAPENTIN 400 MG CAP
400 mg | ORAL_CAPSULE | Freq: Three times a day (TID) | ORAL | 1 refills | Status: AC
Start: 2017-10-12 — End: ?

## 2017-10-12 MED ORDER — IBUPROFEN 800 MG TAB
800 mg | ORAL_TABLET | Freq: Three times a day (TID) | ORAL | 0 refills | Status: AC | PRN
Start: 2017-10-12 — End: ?
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
## 2017-10-24 NOTE — Telephone Encounter (Signed)
Patient is requesting medication for nausea stated she is not feeling well due to the painful ovarian cyst. Please contact patient for any questions or concerns of this matter when available

## 2017-10-25 NOTE — Telephone Encounter (Signed)
Tried to reach patient st this time. No answer noted.Unable to leave voicemail

## 2017-10-25 NOTE — Telephone Encounter (Signed)
10/25/2017  7:58 AM    Chief Complaint   Patient presents with   ??? Request For New Medication       Noted patient request for antiemetic and not feeling well. No results noted from transvaginal US that was ordered on 7/24. Recommend ED visit since it has been persisting for a few weeks and outpatient US was not completed. Needs further evaluation.

## 2018-06-18 ENCOUNTER — Inpatient Hospital Stay
Admit: 2018-06-18 | Discharge: 2018-06-18 | Disposition: A | Discharge status: Home / Self Care | Payer: MEDICAID | Attending: Emergency Medicine

## 2018-06-18 DIAGNOSIS — R51 Headache: Secondary | ICD-10-CM

## 2018-06-18 LAB — STREP AG SCREEN, GROUP A
Group A Strep Ag ID: NEGATIVE
Strep A Ag: NEGATIVE

## 2018-06-18 MED ORDER — BUTALBITAL-ACETAMINOPHEN-CAFFEINE 50 MG-325 MG-40 MG TAB
50-325-40 mg | ORAL | Status: AC
Start: 2018-06-18 — End: 2018-06-18
  Administered 2018-06-18: 14:00:00 via ORAL

## 2018-06-18 MED FILL — BUTALBITAL-ACETAMINOPHEN-CAFFEINE 50 MG-325 MG-40 MG TAB: 50-325-40 mg | ORAL | Qty: 1

## 2018-06-18 NOTE — ED Notes (Signed)
Pt dressed but refusing to leave the room. Asking to speak with the charge nurse. Charge nurse notified of pt's wishes.
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
## 2018-06-18 NOTE — ED Notes (Signed)
Pt noted to still be in stretcher and not dressed. Pt again asked to dress for discharge.   I explained to pt that she needed to dress and exit the room. Pt agreeable.

## 2018-06-18 NOTE — ED Notes (Signed)
Pt noted to still be in stretcher and not dressed. Pt again asked to dress for discharge.

## 2018-06-18 NOTE — ED Notes (Signed)
Pt given discharge instructions and encouraged to dress, Pt requesting a medicaid cab. Secretary informed of need.

## 2018-06-18 NOTE — ED Notes (Signed)
Provider in the room to speak with the patient in regards to discharge. Pt agitated and yelling at the provider. Pt refusing additional care. Pt instructed to leave the department. Pt informed that a cab has been arranged. Pt asked how long. I explained to the patient that it could potentially be up to 4 hours. Pt starting yelling at me and verbalizing profanities. Security present and pt escorted into waiting room

## 2018-06-18 NOTE — ED Notes (Signed)
Returned to room with Dr Garnet Koyanagi. Pt more cooperative. Pt states migraine and sore throat for 2 days. Pt denies cough,, sob or fever.

## 2018-06-18 NOTE — ED Notes (Signed)
Attempted to obtain strep and nasal swabs. Pt is very resistant to all. I was able to get strep swab but pt kept pulling away during Nasal swab and I was unable to obtain after 4 attempts. Dr Garnet Koyanagi aware
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
## 2018-06-18 NOTE — ED Notes (Signed)
Pt given discharge instructions and encouraged to dress, Pt requesting a medicaid cab. Secretary informed of need.
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
## 2018-06-18 NOTE — ED Notes (Signed)
Attempted to obtain strep and nasal swabs. Pt is very resistant to all. I was able to get strep swab but pt kept pulling away during Nasal swab and I was unable to obtain after 4 attempts. Dr Juliano aware

## 2018-06-18 NOTE — ED Notes (Addendum)
Provider in the room to speak with the patient in regards to discharge. Pt agitated and yelling at the provider. Pt refusing additional care. Pt instructed to leave the department. Pt informed that a cab has been arranged. Pt asked how long. I explained to the patient that it could potentially be up to 4 hours. Pt starting yelling at me and verbalizing profanities. Security present and pt escorted into waiting room
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
## 2018-06-18 NOTE — ED Triage Notes (Signed)
Returned to room with Dr Juliano. Pt more cooperative. Pt states migraine and sore throat for 2 days. Pt denies cough,, sob or fever.

## 2018-06-18 NOTE — ED Notes (Signed)
Pt noted to still be in stretcher and not dressed. Pt again asked to dress for discharge.

## 2018-06-18 NOTE — ED Notes (Signed)
Pt dressed but refusing to leave the room. Asking to speak with the charge nurse. Charge nurse notified of pt's wishes.

## 2018-06-18 NOTE — ED Notes (Signed)
Pt noted to still be in stretcher and not dressed. Pt again asked to dress for discharge.   I explained to pt that she needed to dress and exit the room. Pt agreeable.

## 2018-06-19 ENCOUNTER — Inpatient Hospital Stay: Admit: 2018-06-19 | Discharge: 2018-06-19 | Payer: MEDICAID | Attending: Emergency Medicine

## 2018-06-19 DIAGNOSIS — Z0471 Encounter for examination and observation following alleged adult physical abuse: Secondary | ICD-10-CM
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
## 2018-06-20 LAB — CULTURE, THROAT
Culture result:: NORMAL
Culture: NORMAL
